# Patient Record
Sex: Female | Born: 1935
Health system: Southern US, Community
[De-identification: ages and names within clinical notes are randomized; demographics above are authoritative.]

## PROBLEM LIST (undated history)

## (undated) DIAGNOSIS — R739 Hyperglycemia, unspecified: Secondary | ICD-10-CM

## (undated) DIAGNOSIS — M8580 Other specified disorders of bone density and structure, unspecified site: Secondary | ICD-10-CM

## (undated) DIAGNOSIS — B019 Varicella without complication: Secondary | ICD-10-CM

## (undated) DIAGNOSIS — I10 Essential (primary) hypertension: Secondary | ICD-10-CM

## (undated) DIAGNOSIS — K635 Polyp of colon: Secondary | ICD-10-CM

## (undated) DIAGNOSIS — C50919 Malignant neoplasm of unspecified site of unspecified female breast: Secondary | ICD-10-CM

## (undated) DIAGNOSIS — M1990 Unspecified osteoarthritis, unspecified site: Secondary | ICD-10-CM

## (undated) DIAGNOSIS — E119 Type 2 diabetes mellitus without complications: Secondary | ICD-10-CM

## (undated) DIAGNOSIS — Z9109 Other allergy status, other than to drugs and biological substances: Secondary | ICD-10-CM

## (undated) DIAGNOSIS — Z923 Personal history of irradiation: Secondary | ICD-10-CM

## (undated) DIAGNOSIS — G629 Polyneuropathy, unspecified: Secondary | ICD-10-CM

## (undated) DIAGNOSIS — H409 Unspecified glaucoma: Secondary | ICD-10-CM

## (undated) DIAGNOSIS — E7800 Pure hypercholesterolemia, unspecified: Secondary | ICD-10-CM

## (undated) DIAGNOSIS — K5790 Diverticulosis of intestine, part unspecified, without perforation or abscess without bleeding: Secondary | ICD-10-CM

## (undated) DIAGNOSIS — E039 Hypothyroidism, unspecified: Secondary | ICD-10-CM

## (undated) DIAGNOSIS — N35919 Unspecified urethral stricture, male, unspecified site: Secondary | ICD-10-CM

## (undated) DIAGNOSIS — K219 Gastro-esophageal reflux disease without esophagitis: Secondary | ICD-10-CM

## (undated) DIAGNOSIS — C801 Malignant (primary) neoplasm, unspecified: Secondary | ICD-10-CM

## (undated) HISTORY — DX: Varicella without complication: B01.9

## (undated) HISTORY — DX: Gastro-esophageal reflux disease without esophagitis: K21.9

## (undated) HISTORY — DX: Polyp of colon: K63.5

## (undated) HISTORY — DX: Essential (primary) hypertension: I10

## (undated) HISTORY — PX: TUBAL LIGATION: SHX77

## (undated) HISTORY — DX: Other allergy status, other than to drugs and biological substances: Z91.09

## (undated) HISTORY — PX: EYE SURGERY: SHX253

## (undated) HISTORY — DX: Hypothyroidism, unspecified: E03.9

## (undated) HISTORY — DX: Polyneuropathy, unspecified: G62.9

## (undated) HISTORY — DX: Unspecified urethral stricture, male, unspecified site: N35.919

## (undated) HISTORY — DX: Hyperglycemia, unspecified: R73.9

## (undated) HISTORY — DX: Pure hypercholesterolemia, unspecified: E78.00

## (undated) HISTORY — DX: Unspecified glaucoma: H40.9

## (undated) HISTORY — DX: Unspecified osteoarthritis, unspecified site: M19.90

## (undated) HISTORY — DX: Malignant neoplasm of unspecified site of unspecified female breast: C50.919

## (undated) HISTORY — DX: Type 2 diabetes mellitus without complications: E11.9

## (undated) HISTORY — PX: URETHRAL DILATION: SUR417

## (undated) HISTORY — DX: Other specified disorders of bone density and structure, unspecified site: M85.80

## (undated) HISTORY — DX: Diverticulosis of intestine, part unspecified, without perforation or abscess without bleeding: K57.90

---

## 1984-04-21 DIAGNOSIS — Z923 Personal history of irradiation: Secondary | ICD-10-CM

## 1984-04-21 DIAGNOSIS — C50919 Malignant neoplasm of unspecified site of unspecified female breast: Secondary | ICD-10-CM

## 1984-04-21 HISTORY — DX: Malignant neoplasm of unspecified site of unspecified female breast: C50.919

## 1984-04-21 HISTORY — DX: Personal history of irradiation: Z92.3

## 1984-04-21 HISTORY — PX: BREAST LUMPECTOMY: SHX2

## 1984-09-19 HISTORY — PX: BREAST BIOPSY: SHX20

## 1991-04-22 HISTORY — PX: MASTECTOMY: SHX3

## 1999-02-13 ENCOUNTER — Ambulatory Visit (HOSPITAL_COMMUNITY): Admission: RE | Admit: 1999-02-13 | Discharge: 1999-02-13 | Payer: Self-pay | Admitting: Obstetrics & Gynecology

## 2006-04-09 ENCOUNTER — Ambulatory Visit: Payer: Self-pay | Admitting: Internal Medicine

## 2007-04-21 ENCOUNTER — Ambulatory Visit: Payer: Self-pay | Admitting: Internal Medicine

## 2008-05-02 ENCOUNTER — Ambulatory Visit: Payer: Self-pay | Admitting: General Practice

## 2008-05-26 ENCOUNTER — Ambulatory Visit: Payer: Self-pay | Admitting: Family Medicine

## 2008-06-19 HISTORY — PX: KNEE SURGERY: SHX244

## 2008-06-26 ENCOUNTER — Ambulatory Visit: Payer: Self-pay | Admitting: General Practice

## 2008-07-10 ENCOUNTER — Ambulatory Visit: Payer: Self-pay | Admitting: General Practice

## 2009-01-10 ENCOUNTER — Ambulatory Visit: Payer: Self-pay | Admitting: Internal Medicine

## 2009-01-19 ENCOUNTER — Ambulatory Visit: Payer: Self-pay | Admitting: Internal Medicine

## 2009-02-19 ENCOUNTER — Ambulatory Visit: Payer: Self-pay | Admitting: Internal Medicine

## 2009-03-21 ENCOUNTER — Ambulatory Visit: Payer: Self-pay | Admitting: Internal Medicine

## 2009-06-28 ENCOUNTER — Ambulatory Visit: Payer: Self-pay | Admitting: Unknown Physician Specialty

## 2009-08-06 ENCOUNTER — Ambulatory Visit: Payer: Self-pay | Admitting: Gastroenterology

## 2009-08-20 ENCOUNTER — Ambulatory Visit: Payer: Self-pay | Admitting: Gastroenterology

## 2010-04-01 ENCOUNTER — Ambulatory Visit: Payer: Self-pay

## 2010-04-21 HISTORY — PX: KNEE SURGERY: SHX244

## 2010-05-07 ENCOUNTER — Ambulatory Visit: Payer: Self-pay | Admitting: Anesthesiology

## 2010-05-15 ENCOUNTER — Ambulatory Visit: Payer: Self-pay | Admitting: General Practice

## 2010-06-25 ENCOUNTER — Ambulatory Visit: Payer: Self-pay | Admitting: Internal Medicine

## 2010-06-27 LAB — HM DEXA SCAN

## 2010-10-08 ENCOUNTER — Ambulatory Visit: Payer: Self-pay | Admitting: Obstetrics and Gynecology

## 2010-10-24 ENCOUNTER — Ambulatory Visit: Payer: Self-pay | Admitting: Internal Medicine

## 2011-02-19 ENCOUNTER — Ambulatory Visit: Payer: Self-pay | Admitting: Internal Medicine

## 2011-05-06 DIAGNOSIS — D510 Vitamin B12 deficiency anemia due to intrinsic factor deficiency: Secondary | ICD-10-CM | POA: Diagnosis not present

## 2011-05-19 DIAGNOSIS — H4010X0 Unspecified open-angle glaucoma, stage unspecified: Secondary | ICD-10-CM | POA: Diagnosis not present

## 2011-05-29 DIAGNOSIS — R21 Rash and other nonspecific skin eruption: Secondary | ICD-10-CM | POA: Diagnosis not present

## 2011-06-04 DIAGNOSIS — M810 Age-related osteoporosis without current pathological fracture: Secondary | ICD-10-CM | POA: Diagnosis not present

## 2011-06-06 DIAGNOSIS — D510 Vitamin B12 deficiency anemia due to intrinsic factor deficiency: Secondary | ICD-10-CM | POA: Diagnosis not present

## 2011-06-26 DIAGNOSIS — L820 Inflamed seborrheic keratosis: Secondary | ICD-10-CM | POA: Diagnosis not present

## 2011-06-30 DIAGNOSIS — H4010X0 Unspecified open-angle glaucoma, stage unspecified: Secondary | ICD-10-CM | POA: Diagnosis not present

## 2011-07-07 DIAGNOSIS — D510 Vitamin B12 deficiency anemia due to intrinsic factor deficiency: Secondary | ICD-10-CM | POA: Diagnosis not present

## 2011-07-09 DIAGNOSIS — M79609 Pain in unspecified limb: Secondary | ICD-10-CM | POA: Diagnosis not present

## 2011-07-09 DIAGNOSIS — B351 Tinea unguium: Secondary | ICD-10-CM | POA: Diagnosis not present

## 2011-08-05 DIAGNOSIS — E7800 Pure hypercholesterolemia, unspecified: Secondary | ICD-10-CM | POA: Diagnosis not present

## 2011-08-05 DIAGNOSIS — R5381 Other malaise: Secondary | ICD-10-CM | POA: Diagnosis not present

## 2011-08-05 DIAGNOSIS — Z79899 Other long term (current) drug therapy: Secondary | ICD-10-CM | POA: Diagnosis not present

## 2011-08-05 DIAGNOSIS — E119 Type 2 diabetes mellitus without complications: Secondary | ICD-10-CM | POA: Diagnosis not present

## 2011-08-05 DIAGNOSIS — R5383 Other fatigue: Secondary | ICD-10-CM | POA: Diagnosis not present

## 2011-08-07 DIAGNOSIS — D510 Vitamin B12 deficiency anemia due to intrinsic factor deficiency: Secondary | ICD-10-CM | POA: Diagnosis not present

## 2011-08-12 DIAGNOSIS — I10 Essential (primary) hypertension: Secondary | ICD-10-CM | POA: Diagnosis not present

## 2011-08-12 DIAGNOSIS — M79609 Pain in unspecified limb: Secondary | ICD-10-CM | POA: Diagnosis not present

## 2011-08-12 DIAGNOSIS — E119 Type 2 diabetes mellitus without complications: Secondary | ICD-10-CM | POA: Diagnosis not present

## 2011-08-12 DIAGNOSIS — Z124 Encounter for screening for malignant neoplasm of cervix: Secondary | ICD-10-CM | POA: Diagnosis not present

## 2011-08-12 DIAGNOSIS — E7800 Pure hypercholesterolemia, unspecified: Secondary | ICD-10-CM | POA: Diagnosis not present

## 2011-08-21 DIAGNOSIS — Z1211 Encounter for screening for malignant neoplasm of colon: Secondary | ICD-10-CM | POA: Diagnosis not present

## 2011-08-21 LAB — IFOBT (OCCULT BLOOD): IFOBT: NEGATIVE

## 2011-08-29 DIAGNOSIS — H4010X0 Unspecified open-angle glaucoma, stage unspecified: Secondary | ICD-10-CM | POA: Diagnosis not present

## 2011-09-09 DIAGNOSIS — D510 Vitamin B12 deficiency anemia due to intrinsic factor deficiency: Secondary | ICD-10-CM | POA: Diagnosis not present

## 2011-09-30 DIAGNOSIS — L570 Actinic keratosis: Secondary | ICD-10-CM | POA: Diagnosis not present

## 2011-09-30 DIAGNOSIS — Z85828 Personal history of other malignant neoplasm of skin: Secondary | ICD-10-CM | POA: Diagnosis not present

## 2011-09-30 DIAGNOSIS — L821 Other seborrheic keratosis: Secondary | ICD-10-CM | POA: Diagnosis not present

## 2011-09-30 DIAGNOSIS — L820 Inflamed seborrheic keratosis: Secondary | ICD-10-CM | POA: Diagnosis not present

## 2011-10-01 DIAGNOSIS — R21 Rash and other nonspecific skin eruption: Secondary | ICD-10-CM | POA: Diagnosis not present

## 2011-10-01 DIAGNOSIS — I10 Essential (primary) hypertension: Secondary | ICD-10-CM | POA: Diagnosis not present

## 2011-10-07 ENCOUNTER — Ambulatory Visit: Payer: Self-pay | Admitting: Obstetrics and Gynecology

## 2011-10-07 DIAGNOSIS — N83209 Unspecified ovarian cyst, unspecified side: Secondary | ICD-10-CM | POA: Diagnosis not present

## 2011-10-13 DIAGNOSIS — Z853 Personal history of malignant neoplasm of breast: Secondary | ICD-10-CM | POA: Diagnosis not present

## 2011-10-13 DIAGNOSIS — Z8742 Personal history of other diseases of the female genital tract: Secondary | ICD-10-CM | POA: Diagnosis not present

## 2011-10-14 DIAGNOSIS — D510 Vitamin B12 deficiency anemia due to intrinsic factor deficiency: Secondary | ICD-10-CM | POA: Diagnosis not present

## 2011-10-29 ENCOUNTER — Ambulatory Visit: Payer: Self-pay | Admitting: Internal Medicine

## 2011-10-29 DIAGNOSIS — R928 Other abnormal and inconclusive findings on diagnostic imaging of breast: Secondary | ICD-10-CM | POA: Diagnosis not present

## 2011-10-29 DIAGNOSIS — Z853 Personal history of malignant neoplasm of breast: Secondary | ICD-10-CM | POA: Diagnosis not present

## 2011-10-29 DIAGNOSIS — N63 Unspecified lump in unspecified breast: Secondary | ICD-10-CM | POA: Diagnosis not present

## 2011-11-14 DIAGNOSIS — D510 Vitamin B12 deficiency anemia due to intrinsic factor deficiency: Secondary | ICD-10-CM | POA: Diagnosis not present

## 2011-12-04 DIAGNOSIS — Z79899 Other long term (current) drug therapy: Secondary | ICD-10-CM | POA: Diagnosis not present

## 2011-12-04 DIAGNOSIS — E7800 Pure hypercholesterolemia, unspecified: Secondary | ICD-10-CM | POA: Diagnosis not present

## 2011-12-04 DIAGNOSIS — I10 Essential (primary) hypertension: Secondary | ICD-10-CM | POA: Diagnosis not present

## 2011-12-04 DIAGNOSIS — E119 Type 2 diabetes mellitus without complications: Secondary | ICD-10-CM | POA: Diagnosis not present

## 2011-12-08 DIAGNOSIS — H4010X0 Unspecified open-angle glaucoma, stage unspecified: Secondary | ICD-10-CM | POA: Diagnosis not present

## 2011-12-24 DIAGNOSIS — D510 Vitamin B12 deficiency anemia due to intrinsic factor deficiency: Secondary | ICD-10-CM | POA: Diagnosis not present

## 2012-01-05 DIAGNOSIS — Z23 Encounter for immunization: Secondary | ICD-10-CM | POA: Diagnosis not present

## 2012-01-27 DIAGNOSIS — D510 Vitamin B12 deficiency anemia due to intrinsic factor deficiency: Secondary | ICD-10-CM | POA: Diagnosis not present

## 2012-02-17 ENCOUNTER — Telehealth: Payer: Self-pay | Admitting: Internal Medicine

## 2012-02-17 NOTE — Telephone Encounter (Signed)
Patient wanting to talk with the physician about her medications. She is taking Gabapentin 100 mg twice a day for stinging in her feet and it is not working.

## 2012-02-17 NOTE — Telephone Encounter (Signed)
I can see her at 8:30 on Monday to discuss the burning.  Will need to notify pt and schedule an appt

## 2012-02-17 NOTE — Telephone Encounter (Signed)
Patient wanting to talk with the physician about her medications. She is taking Gabapentin 100 mg twice a day for stinging in her feet and it is not working. °  Patient wanting to talk with the physician about her medications. She is taking Gabapentin 100 mg twice a day for stinging in her feet and it is not working."

## 2012-03-01 DIAGNOSIS — D510 Vitamin B12 deficiency anemia due to intrinsic factor deficiency: Secondary | ICD-10-CM | POA: Diagnosis not present

## 2012-03-19 ENCOUNTER — Ambulatory Visit (INDEPENDENT_AMBULATORY_CARE_PROVIDER_SITE_OTHER): Payer: Medicare Other | Admitting: Internal Medicine

## 2012-03-19 ENCOUNTER — Encounter: Payer: Self-pay | Admitting: Internal Medicine

## 2012-03-19 VITALS — BP 120/70 | HR 75 | Temp 97.8°F | Ht 65.5 in | Wt 170.0 lb

## 2012-03-19 DIAGNOSIS — Z853 Personal history of malignant neoplasm of breast: Secondary | ICD-10-CM | POA: Insufficient documentation

## 2012-03-19 DIAGNOSIS — G609 Hereditary and idiopathic neuropathy, unspecified: Secondary | ICD-10-CM

## 2012-03-19 DIAGNOSIS — E039 Hypothyroidism, unspecified: Secondary | ICD-10-CM | POA: Insufficient documentation

## 2012-03-19 DIAGNOSIS — R739 Hyperglycemia, unspecified: Secondary | ICD-10-CM

## 2012-03-19 DIAGNOSIS — G629 Polyneuropathy, unspecified: Secondary | ICD-10-CM

## 2012-03-19 DIAGNOSIS — C50919 Malignant neoplasm of unspecified site of unspecified female breast: Secondary | ICD-10-CM

## 2012-03-19 DIAGNOSIS — R7309 Other abnormal glucose: Secondary | ICD-10-CM | POA: Diagnosis not present

## 2012-03-19 DIAGNOSIS — I10 Essential (primary) hypertension: Secondary | ICD-10-CM | POA: Insufficient documentation

## 2012-03-19 DIAGNOSIS — E1142 Type 2 diabetes mellitus with diabetic polyneuropathy: Secondary | ICD-10-CM | POA: Insufficient documentation

## 2012-03-19 DIAGNOSIS — E7800 Pure hypercholesterolemia, unspecified: Secondary | ICD-10-CM | POA: Diagnosis not present

## 2012-03-19 NOTE — Progress Notes (Signed)
Subjective:    Patient ID: Julie Hoover, female    DOB: 1929/06/02, 78 y.o.   MRN: [id number]  HPI 78 year old female with past history of hypertension, hypercholesterolemia, hyperglycemia and breast cancer who comes in today for a scheduled follow up.  She states she has been doing well.  She has been having more problems with her neuropathy.  Taking gabapentin 100mg  bid.  She was questioning if she could take an extra gabapentin.  Some unsteady gait - minimal.  Attributes to the neuropathy.  No falls.  She is careful, especially on uneven ground.  AM sugars averaging 88-100 and pm sugars - 120-140s.  Bowels stable.  No chest pain or tightness.  No increased sob.    Past Medical History  Diagnosis Date  . Hypertension   . Hypercholesterolemia   . Hyperglycemia   . Hypothyroidism   . Breast cancer 1986    s/p right lumpectomy, XRT and Tamoxifen  . Diverticulosis   . Environmental allergies   . Arthritis   . GERD (gastroesophageal reflux disease)   . Glaucoma   . Colon polyps   . Chicken pox     Outpatient Encounter Prescriptions as of 03/19/2012  Medication Sig Dispense Refill  . amLODipine (NORVASC) 5 MG tablet Take 5 mg by mouth daily.      Marland Kitchen aspirin 81 MG tablet Take 81 mg by mouth daily.      . bimatoprost (LUMIGAN) 0.01 % SOLN 1 drop at bedtime.      . Calcium Carbonate-Vitamin D (CALCIUM 600+D) 600-400 MG-UNIT per tablet Take 1 tablet by mouth 3 (three) times daily.       . cetirizine (ZYRTEC) 10 MG tablet Take 10 mg by mouth daily.      . Coenzyme Q10 (COQ10) 400 MG CAPS Take by mouth daily.      . Cyanocobalamin (VITAMIN B-12 IJ) Inject as directed once. monthly      . gabapentin (NEURONTIN) 100 MG capsule Take 100 mg by mouth 2 (two) times daily.      Marland Kitchen latanoprost (XALATAN) 0.005 % ophthalmic solution Place 1 drop into both eyes at bedtime.      Marland Kitchen levothyroxine (SYNTHROID, LEVOTHROID) 50 MCG tablet Take 50 mcg by mouth daily.      Marland Kitchen omeprazole (PRILOSEC) 20 MG capsule  Take 20 mg by mouth 2 (two) times daily.      . simvastatin (ZOCOR) 10 MG tablet Take 10 mg by mouth daily.      . temazepam (RESTORIL) 30 MG capsule Take 30 mg by mouth at bedtime as needed.      . timolol (BETIMOL) 0.25 % ophthalmic solution Place 1 drop into both eyes 2 (two) times daily.      . zoledronic acid (RECLAST) 5 MG/100ML SOLN Inject 5 mg into the vein once.      . [DISCONTINUED] co-enzyme Q-10 30 MG capsule Take 30 mg by mouth 2 (two) times daily.      Marland Kitchen glucosamine-chondroitin 500-400 MG tablet Take 1 tablet by mouth 2 (two) times daily.      Marland Kitchen ibandronate (BONIVA) 150 MG tablet Take 150 mg by mouth every 30 (thirty) days. Take in the morning with a full glass of water, on an empty stomach, and do not take anything else by mouth or lie down for the next 30 min.        Review of Systems Patient denies any headache, lightheadedness or dizziness.  No significant sinus or allergy problems. No  chest pain, tightness or palpitations.  No increased shortness of breath, cough or congestion.  No nausea or vomiting.  No increased acid reflux.  No abdominal pain or cramping.  No bowel change, such as diarrhea, constipation, BRBPR or melana.  No urine change.        Objective:   Physical Exam Filed Vitals:   03/19/12 1108  BP: 120/70  Pulse: 75  Temp: 97.8 F (3.24 C)   78 year old female in no acute distress.   HEENT:  Nares- clear.  Oropharynx - without lesions. NECK:  Supple.  Nontender.  No audible bruit.  HEART:  Appears to be regular. LUNGS:  No crackles or wheezing audible.  Respirations even and unlabored.  RADIAL PULSE:  Equal bilaterally.  ABDOMEN:  Soft, nontender.  Bowel sounds present and normal.  No audible abdominal bruit.    EXTREMITIES:  No increased edema present.  DP pulses palpable and equal bilaterally.  Feet without lesions.          Assessment & Plan:  DIFFICULTY SLEEPING.  Takes Temazepam prn.  Follow.    HEALTH MAINTENANCE.  She reports she just had a  physical in 6/13.  Obtain records.  States she is up to date with her mammograms.

## 2012-03-19 NOTE — Patient Instructions (Signed)
It was nice seeing you today.  I am glad you have been doing well.  I am going to have you continue the neurontin (gabapentin) 100mg  in the am and increase to 2 (100mg ) gabapentin in the evening.  Let me know if problems.

## 2012-03-20 ENCOUNTER — Encounter: Payer: Self-pay | Admitting: Internal Medicine

## 2012-03-20 DIAGNOSIS — G629 Polyneuropathy, unspecified: Secondary | ICD-10-CM | POA: Insufficient documentation

## 2012-03-20 NOTE — Assessment & Plan Note (Signed)
On Gabapentin.  Will increase to 100mg  q am and 100mg  (2 q pm).  Notify me if persistent problems.

## 2012-03-20 NOTE — Assessment & Plan Note (Signed)
S/p right lumpectomy 1986 with XRT and Tamoxifen 1986.  S/p mastectomy (right) for reoccurrence 1993.  States she is up to date with her mammograms.  Obtain results.

## 2012-03-20 NOTE — Assessment & Plan Note (Signed)
Blood pressure under good control.  Same medication.  Check metabolic panel with next labs.

## 2012-03-20 NOTE — Assessment & Plan Note (Signed)
On Simvastatin.  Low cholesterol diet and exercise.  Check lipid panel and liver function with next labs.

## 2012-03-20 NOTE — Assessment & Plan Note (Signed)
Sugars as outlined.  Low carb diet.  Exercise.  Check met b and a1c.

## 2012-03-20 NOTE — Assessment & Plan Note (Signed)
On synthroid.  Check tsh with next labs.   

## 2012-04-01 ENCOUNTER — Telehealth: Payer: Self-pay | Admitting: Internal Medicine

## 2012-04-01 ENCOUNTER — Ambulatory Visit: Payer: Medicare Other

## 2012-04-01 NOTE — Telephone Encounter (Signed)
Please call pt when we get b12 in to make appointment

## 2012-04-08 DIAGNOSIS — H4010X0 Unspecified open-angle glaucoma, stage unspecified: Secondary | ICD-10-CM | POA: Diagnosis not present

## 2012-04-08 NOTE — Telephone Encounter (Signed)
Patient is coming in on 04/09/12 for b-12 injection

## 2012-04-09 ENCOUNTER — Ambulatory Visit (INDEPENDENT_AMBULATORY_CARE_PROVIDER_SITE_OTHER): Payer: Medicare Other | Admitting: Internal Medicine

## 2012-04-09 DIAGNOSIS — E538 Deficiency of other specified B group vitamins: Secondary | ICD-10-CM

## 2012-04-09 MED ORDER — CYANOCOBALAMIN 1000 MCG/ML IJ SOLN
1000.0000 ug | Freq: Once | INTRAMUSCULAR | Status: AC
  Administered 2012-04-09: 1000 ug via INTRAMUSCULAR

## 2012-04-20 ENCOUNTER — Ambulatory Visit: Payer: Self-pay | Admitting: Internal Medicine

## 2012-04-26 ENCOUNTER — Other Ambulatory Visit: Payer: Self-pay | Admitting: Internal Medicine

## 2012-04-26 MED ORDER — OMEPRAZOLE 20 MG PO CPDR: 20.0000 mg | DELAYED_RELEASE_CAPSULE | Freq: Two times a day (BID) | ORAL | Status: DC

## 2012-04-26 NOTE — Telephone Encounter (Signed)
Omeprazole 20 mg  Take 1 capsule twice daily  # 90

## 2012-04-26 NOTE — Telephone Encounter (Signed)
Sent in to pharmacy.  

## 2012-05-05 ENCOUNTER — Other Ambulatory Visit: Payer: Self-pay | Admitting: *Deleted

## 2012-05-05 MED ORDER — OMEPRAZOLE 20 MG PO CPDR: 20.0000 mg | DELAYED_RELEASE_CAPSULE | Freq: Two times a day (BID) | ORAL | Status: DC

## 2012-05-05 NOTE — Telephone Encounter (Signed)
Corrected pharmacy error. RX was sent to [facility].

## 2012-05-07 ENCOUNTER — Other Ambulatory Visit: Payer: Self-pay | Admitting: Internal Medicine

## 2012-05-07 MED ORDER — GABAPENTIN 100 MG PO CAPS: 100.0000 mg | ORAL_CAPSULE | Freq: Two times a day (BID) | ORAL | Status: DC

## 2012-05-07 NOTE — Telephone Encounter (Signed)
Refill on Gabapentin cap 100 mg to [facility]

## 2012-05-07 NOTE — Telephone Encounter (Signed)
Sent in to pharmacy.  

## 2012-05-10 ENCOUNTER — Ambulatory Visit (INDEPENDENT_AMBULATORY_CARE_PROVIDER_SITE_OTHER): Payer: Medicare Other | Admitting: *Deleted

## 2012-05-10 DIAGNOSIS — E538 Deficiency of other specified B group vitamins: Secondary | ICD-10-CM | POA: Diagnosis not present

## 2012-05-10 MED ORDER — CYANOCOBALAMIN 1000 MCG/ML IJ SOLN
1000.0000 ug | Freq: Once | INTRAMUSCULAR | Status: AC
  Administered 2012-05-10: 1000 ug via INTRAMUSCULAR

## 2012-05-11 ENCOUNTER — Encounter: Payer: Self-pay | Admitting: Internal Medicine

## 2012-05-12 ENCOUNTER — Telehealth: Payer: Self-pay | Admitting: Internal Medicine

## 2012-05-12 MED ORDER — GABAPENTIN 100 MG PO CAPS: 100.0000 mg | ORAL_CAPSULE | Freq: Three times a day (TID) | ORAL | Status: DC

## 2012-05-12 NOTE — Telephone Encounter (Signed)
Sent new prescription into pharmacy.

## 2012-05-12 NOTE — Telephone Encounter (Signed)
Need to change rx for gabapentin to 100mg  - take three q hs.  (not 2).  Pt just sent me a my chart message and she has increased her gabapentin from 2 to 3 capsules q hs.  She requested refill on 05/07/12.  Need to change rx.  Thanks.

## 2012-05-19 ENCOUNTER — Encounter: Payer: Self-pay | Admitting: Internal Medicine

## 2012-05-23 ENCOUNTER — Telehealth: Payer: Self-pay | Admitting: Internal Medicine

## 2012-05-23 DIAGNOSIS — C50919 Malignant neoplasm of unspecified site of unspecified female breast: Secondary | ICD-10-CM

## 2012-05-23 DIAGNOSIS — M810 Age-related osteoporosis without current pathological fracture: Secondary | ICD-10-CM

## 2012-05-23 NOTE — Telephone Encounter (Signed)
Orders placed for labs - for reclast

## 2012-06-07 ENCOUNTER — Encounter: Payer: Self-pay | Admitting: Internal Medicine

## 2012-06-07 MED ORDER — AMLODIPINE BESYLATE 5 MG PO TABS: 5.0000 mg | ORAL_TABLET | Freq: Every day | ORAL | Status: DC

## 2012-06-07 NOTE — Telephone Encounter (Signed)
Sent in to pharmacy.  

## 2012-06-08 ENCOUNTER — Other Ambulatory Visit (INDEPENDENT_AMBULATORY_CARE_PROVIDER_SITE_OTHER): Payer: Medicare Other

## 2012-06-08 DIAGNOSIS — R739 Hyperglycemia, unspecified: Secondary | ICD-10-CM

## 2012-06-08 DIAGNOSIS — M810 Age-related osteoporosis without current pathological fracture: Secondary | ICD-10-CM

## 2012-06-08 DIAGNOSIS — I10 Essential (primary) hypertension: Secondary | ICD-10-CM | POA: Diagnosis not present

## 2012-06-08 DIAGNOSIS — C50919 Malignant neoplasm of unspecified site of unspecified female breast: Secondary | ICD-10-CM | POA: Diagnosis not present

## 2012-06-08 DIAGNOSIS — R7309 Other abnormal glucose: Secondary | ICD-10-CM

## 2012-06-08 DIAGNOSIS — E7800 Pure hypercholesterolemia, unspecified: Secondary | ICD-10-CM | POA: Diagnosis not present

## 2012-06-08 LAB — HEMOGLOBIN A1C: Hgb A1c MFr Bld: 6.8 % — ABNORMAL HIGH (ref 4.6–6.5)

## 2012-06-08 LAB — HEPATIC FUNCTION PANEL
ALT: 16 U/L (ref 0–35)
AST: 18 U/L (ref 0–37)
Alkaline Phosphatase: 48 U/L (ref 39–117)
Bilirubin, Direct: 0.1 mg/dL (ref 0.0–0.3)
Total Protein: 7.7 g/dL (ref 6.0–8.3)

## 2012-06-08 LAB — BASIC METABOLIC PANEL
Chloride: 108 mEq/L (ref 96–112)
GFR: 62.32 mL/min (ref >60.00)
Potassium: 4 mEq/L (ref 3.5–5.1)
Sodium: 142 mEq/L (ref 135–145)

## 2012-06-08 LAB — URINALYSIS, ROUTINE W REFLEX MICROSCOPIC
Bilirubin Urine: NEGATIVE
Hgb urine dipstick: NEGATIVE
Total Protein, Urine: NEGATIVE
Urine Glucose: NEGATIVE

## 2012-06-08 LAB — LIPID PANEL: Cholesterol: 168 mg/dL (ref 0–200)

## 2012-06-09 ENCOUNTER — Telehealth: Payer: Self-pay | Admitting: Internal Medicine

## 2012-06-09 NOTE — Telephone Encounter (Signed)
Notified pt of labs via mychart

## 2012-06-10 ENCOUNTER — Encounter: Payer: Self-pay | Admitting: Internal Medicine

## 2012-06-15 ENCOUNTER — Ambulatory Visit (INDEPENDENT_AMBULATORY_CARE_PROVIDER_SITE_OTHER): Payer: Medicare Other | Admitting: Internal Medicine

## 2012-06-15 ENCOUNTER — Encounter: Payer: Self-pay | Admitting: Internal Medicine

## 2012-06-15 VITALS — BP 112/60 | HR 65 | Temp 97.8°F | Ht 65.5 in | Wt 169.5 lb

## 2012-06-15 DIAGNOSIS — G609 Hereditary and idiopathic neuropathy, unspecified: Secondary | ICD-10-CM

## 2012-06-15 DIAGNOSIS — M8580 Other specified disorders of bone density and structure, unspecified site: Secondary | ICD-10-CM

## 2012-06-15 DIAGNOSIS — E039 Hypothyroidism, unspecified: Secondary | ICD-10-CM

## 2012-06-15 DIAGNOSIS — G629 Polyneuropathy, unspecified: Secondary | ICD-10-CM

## 2012-06-15 DIAGNOSIS — E7800 Pure hypercholesterolemia, unspecified: Secondary | ICD-10-CM

## 2012-06-15 DIAGNOSIS — R7309 Other abnormal glucose: Secondary | ICD-10-CM | POA: Diagnosis not present

## 2012-06-15 DIAGNOSIS — R739 Hyperglycemia, unspecified: Secondary | ICD-10-CM

## 2012-06-15 DIAGNOSIS — M949 Disorder of cartilage, unspecified: Secondary | ICD-10-CM

## 2012-06-15 DIAGNOSIS — I10 Essential (primary) hypertension: Secondary | ICD-10-CM

## 2012-06-27 ENCOUNTER — Encounter: Payer: Self-pay | Admitting: Internal Medicine

## 2012-06-27 ENCOUNTER — Other Ambulatory Visit: Payer: Self-pay | Admitting: Internal Medicine

## 2012-06-27 DIAGNOSIS — R739 Hyperglycemia, unspecified: Secondary | ICD-10-CM

## 2012-06-27 DIAGNOSIS — I10 Essential (primary) hypertension: Secondary | ICD-10-CM

## 2012-06-27 DIAGNOSIS — E7800 Pure hypercholesterolemia, unspecified: Secondary | ICD-10-CM

## 2012-06-27 DIAGNOSIS — E039 Hypothyroidism, unspecified: Secondary | ICD-10-CM

## 2012-06-27 DIAGNOSIS — M8580 Other specified disorders of bone density and structure, unspecified site: Secondary | ICD-10-CM | POA: Insufficient documentation

## 2012-06-27 NOTE — Progress Notes (Signed)
Subjective:    Patient ID: Julie Hoover, female    DOB: 25-Oct-1929, 79 y.o.   MRN: [id number]  HPI 79 year old female with past history of hypertension, hypercholesterolemia, hyperglycemia and breast cancer who comes in today for a scheduled follow up.  AM sugars averaging 90-110 and pm sugars - 120-140s.  Bowels stable.  No chest pain or tightness.  No increased sob.  Breathing stable.  Saw Dr Greggory Keen.  Had a follow up pelvic ultrasound.  States everything checked out fine.  Repeat one year.  No abdominal pain or cramping.  Gabapentin controlling neuropathic discomfort.  Gait stable.    Past Medical History  Diagnosis Date  . Hypertension   . Hypercholesterolemia   . Hyperglycemia   . Hypothyroidism   . Breast cancer 1986    s/p right lumpectomy, XRT and Tamoxifen  . Diverticulosis   . Environmental allergies   . Arthritis   . GERD (gastroesophageal reflux disease)   . Glaucoma   . Colon polyps   . Chicken pox     Outpatient Encounter Prescriptions as of 06/15/2012  Medication Sig Dispense Refill  . amLODipine (NORVASC) 5 MG tablet Take 1 tablet (5 mg total) by mouth daily.  90 tablet  1  . aspirin 81 MG tablet Take 81 mg by mouth daily.      . bimatoprost (LUMIGAN) 0.01 % SOLN 1 drop at bedtime.      . Calcium Carbonate-Vitamin D (CALCIUM 600+D) 600-400 MG-UNIT per tablet Take 1 tablet by mouth 3 (three) times daily.       . cetirizine (ZYRTEC) 10 MG tablet Take 10 mg by mouth daily.      . Coenzyme Q10 (COQ10) 400 MG CAPS Take by mouth 2 (two) times daily.       . Cyanocobalamin (VITAMIN B-12 IJ) Inject as directed once. monthly      . gabapentin (NEURONTIN) 100 MG capsule Take 1 capsule (100 mg total) by mouth 3 (three) times daily.  270 capsule  1  . omeprazole (PRILOSEC) 20 MG capsule Take 1 capsule (20 mg total) by mouth 2 (two) times daily.  180 capsule  1  . simvastatin (ZOCOR) 10 MG tablet Take 10 mg by mouth daily.      . temazepam (RESTORIL) 30 MG capsule Take 30  mg by mouth at bedtime as needed.      . timolol (BETIMOL) 0.25 % ophthalmic solution Place 1 drop into both eyes 2 (two) times daily.      . zoledronic acid (RECLAST) 5 MG/100ML SOLN Inject 5 mg into the vein once.      Marland Kitchen glucosamine-chondroitin 500-400 MG tablet Take 1 tablet by mouth 2 (two) times daily.      Marland Kitchen ibandronate (BONIVA) 150 MG tablet Take 150 mg by mouth every 30 (thirty) days. Take in the morning with a full glass of water, on an empty stomach, and do not take anything else by mouth or lie down for the next 30 min.      . latanoprost (XALATAN) 0.005 % ophthalmic solution Place 1 drop into both eyes at bedtime.      Marland Kitchen levothyroxine (SYNTHROID, LEVOTHROID) 50 MCG tablet Take 50 mcg by mouth daily.       No facility-administered encounter medications on file as of 06/15/2012.    Review of Systems Patient denies any headache, lightheadedness or dizziness.  No significant sinus or allergy problems. No chest pain, tightness or palpitations.  No increased shortness of breath, cough  or congestion.  No nausea or vomiting.  No increased acid reflux.  No abdominal pain or cramping.  No bowel change, such as diarrhea, constipation, BRBPR or melana.  No urine change.        Objective:   Physical Exam  Filed Vitals:   06/15/12 0916  BP: 112/60  Pulse: 65  Temp: 97.8 F (46.22 C)   79 year old female in no acute distress.   HEENT:  Nares- clear.  Oropharynx - without lesions. NECK:  Supple.  Nontender.  No audible bruit.  HEART:  Appears to be regular. LUNGS:  No crackles or wheezing audible.  Respirations even and unlabored.  RADIAL PULSE:  Equal bilaterally.  ABDOMEN:  Soft, nontender.  Bowel sounds present and normal.  No audible abdominal bruit.    EXTREMITIES:  No increased edema present.  DP pulses palpable and equal bilaterally.  Feet without lesions.          Assessment & Plan:  DIFFICULTY SLEEPING.  Takes Temazepam prn.  Follow.    HEALTH MAINTENANCE.  Physical 08/12/11.   Colonoscopy 08/20/09 - diverticulosis.  Mammogram 09/29/11 - Birads II.

## 2012-06-27 NOTE — Assessment & Plan Note (Signed)
On Gabapentin.  Symptoms controlled.  Follow.

## 2012-06-27 NOTE — Assessment & Plan Note (Signed)
On Simvastatin.  Low cholesterol diet and exercise.  Lipid panel 06/08/12 revealed total cholesterol 168, triglycerides 64, HDL 57 and LDL 97.

## 2012-06-27 NOTE — Assessment & Plan Note (Signed)
S/p right lumpectomy 1986 with XRT and Tamoxifen 1986.  S/p mastectomy (right) for reoccurrence 1993.  Mammogram 09/29/11 - BiRADS II.

## 2012-06-27 NOTE — Assessment & Plan Note (Signed)
Blood pressure under good control.  Same medication.  Follow metabolic panel.   

## 2012-06-27 NOTE — Progress Notes (Signed)
Orders placed for follow up labs 

## 2012-06-27 NOTE — Assessment & Plan Note (Signed)
Sugars as outlined.  Low carb diet.  Exercise.  A1c 06/08/12 - 6.8.

## 2012-06-27 NOTE — Assessment & Plan Note (Signed)
On synthroid.  Follow tsh.   

## 2012-06-27 NOTE — Assessment & Plan Note (Signed)
Last bone density 06/27/10 revealed osteopenia.  Decreased from 2009.  Vitamin D 06/08/12 wnl.  Continue calcium and vitamin D and weight bearing exercise.

## 2012-06-28 ENCOUNTER — Encounter: Payer: Self-pay | Admitting: Internal Medicine

## 2012-07-16 ENCOUNTER — Encounter: Payer: Self-pay | Admitting: Internal Medicine

## 2012-07-18 ENCOUNTER — Encounter: Payer: Self-pay | Admitting: Internal Medicine

## 2012-07-20 ENCOUNTER — Telehealth: Payer: Self-pay | Admitting: *Deleted

## 2012-07-20 ENCOUNTER — Ambulatory Visit (INDEPENDENT_AMBULATORY_CARE_PROVIDER_SITE_OTHER): Payer: Medicare Other | Admitting: *Deleted

## 2012-07-20 DIAGNOSIS — E538 Deficiency of other specified B group vitamins: Secondary | ICD-10-CM

## 2012-07-20 MED ORDER — CYANOCOBALAMIN 1000 MCG/ML IJ SOLN
1000.0000 ug | Freq: Once | INTRAMUSCULAR | Status: AC
  Administered 2012-07-20: 1000 ug via INTRAMUSCULAR

## 2012-07-20 NOTE — Telephone Encounter (Signed)
Patient called wanting B-12 inj appointment. Appt scheduled.

## 2012-08-04 NOTE — Telephone Encounter (Signed)
Please close encounter

## 2012-08-12 ENCOUNTER — Other Ambulatory Visit: Payer: Self-pay | Admitting: *Deleted

## 2012-08-12 DIAGNOSIS — H4010X0 Unspecified open-angle glaucoma, stage unspecified: Secondary | ICD-10-CM | POA: Diagnosis not present

## 2012-08-12 MED ORDER — LEVOTHYROXINE SODIUM 50 MCG PO TABS: 50.0000 ug | ORAL_TABLET | Freq: Every day | ORAL | Status: DC

## 2012-08-16 DIAGNOSIS — L570 Actinic keratosis: Secondary | ICD-10-CM | POA: Diagnosis not present

## 2012-08-16 DIAGNOSIS — L723 Sebaceous cyst: Secondary | ICD-10-CM | POA: Diagnosis not present

## 2012-08-18 ENCOUNTER — Other Ambulatory Visit: Payer: Self-pay | Admitting: *Deleted

## 2012-08-18 ENCOUNTER — Telehealth: Payer: Self-pay | Admitting: Internal Medicine

## 2012-08-18 NOTE — Telephone Encounter (Signed)
Pt notified that not due reclast until 05/2013

## 2012-08-24 ENCOUNTER — Ambulatory Visit (INDEPENDENT_AMBULATORY_CARE_PROVIDER_SITE_OTHER): Payer: Medicare Other | Admitting: *Deleted

## 2012-08-24 DIAGNOSIS — E538 Deficiency of other specified B group vitamins: Secondary | ICD-10-CM

## 2012-08-24 MED ORDER — CYANOCOBALAMIN 1000 MCG/ML IJ SOLN
1000.0000 ug | Freq: Once | INTRAMUSCULAR | Status: AC
  Administered 2012-08-24: 1000 ug via INTRAMUSCULAR

## 2012-08-27 ENCOUNTER — Other Ambulatory Visit: Payer: Self-pay | Admitting: *Deleted

## 2012-08-27 MED ORDER — LEVOTHYROXINE SODIUM 50 MCG PO TABS: 50.0000 ug | ORAL_TABLET | Freq: Every day | ORAL | Status: DC

## 2012-09-09 ENCOUNTER — Other Ambulatory Visit (INDEPENDENT_AMBULATORY_CARE_PROVIDER_SITE_OTHER): Payer: Medicare Other

## 2012-09-09 DIAGNOSIS — I10 Essential (primary) hypertension: Secondary | ICD-10-CM

## 2012-09-09 DIAGNOSIS — E7800 Pure hypercholesterolemia, unspecified: Secondary | ICD-10-CM

## 2012-09-09 DIAGNOSIS — R7309 Other abnormal glucose: Secondary | ICD-10-CM

## 2012-09-09 DIAGNOSIS — E039 Hypothyroidism, unspecified: Secondary | ICD-10-CM | POA: Diagnosis not present

## 2012-09-09 DIAGNOSIS — R739 Hyperglycemia, unspecified: Secondary | ICD-10-CM

## 2012-09-09 LAB — MICROALBUMIN / CREATININE URINE RATIO
Creatinine,U: 247.1 mg/dL
Microalb Creat Ratio: 0.4 mg/g (ref 0.0–30.0)
Microalb, Ur: 1 mg/dL (ref 0.0–1.9)

## 2012-09-09 LAB — HEPATIC FUNCTION PANEL
Albumin: 4 g/dL (ref 3.5–5.2)
Bilirubin, Direct: 0 mg/dL (ref 0.0–0.3)
Total Protein: 7.4 g/dL (ref 6.0–8.3)

## 2012-09-09 LAB — LIPID PANEL
Cholesterol: 171 mg/dL (ref 0–200)
HDL: 53.4 mg/dL (ref >39.00)
LDL Cholesterol: 103 mg/dL — ABNORMAL HIGH (ref 0–99)
VLDL: 15 mg/dL (ref 0.0–40.0)

## 2012-09-09 LAB — BASIC METABOLIC PANEL
CO2: 27 mEq/L (ref 19–32)
Chloride: 107 mEq/L (ref 96–112)
Creatinine, Ser: 1 mg/dL (ref 0.4–1.2)
Potassium: 4.1 mEq/L (ref 3.5–5.1)

## 2012-09-10 ENCOUNTER — Encounter: Payer: Self-pay | Admitting: Internal Medicine

## 2012-09-10 ENCOUNTER — Other Ambulatory Visit: Payer: Self-pay | Admitting: *Deleted

## 2012-09-10 MED ORDER — GABAPENTIN 100 MG PO CAPS: 100.0000 mg | ORAL_CAPSULE | Freq: Three times a day (TID) | ORAL | Status: DC

## 2012-09-10 MED ORDER — OMEPRAZOLE 20 MG PO CPDR: 20.0000 mg | DELAYED_RELEASE_CAPSULE | Freq: Two times a day (BID) | ORAL | Status: DC

## 2012-09-14 ENCOUNTER — Ambulatory Visit (INDEPENDENT_AMBULATORY_CARE_PROVIDER_SITE_OTHER): Payer: Medicare Other | Admitting: Internal Medicine

## 2012-09-14 ENCOUNTER — Encounter: Payer: Self-pay | Admitting: Internal Medicine

## 2012-09-14 VITALS — BP 120/64 | HR 59 | Temp 98.0°F | Ht 64.3 in | Wt 171.0 lb

## 2012-09-14 DIAGNOSIS — E7800 Pure hypercholesterolemia, unspecified: Secondary | ICD-10-CM | POA: Diagnosis not present

## 2012-09-14 DIAGNOSIS — R7309 Other abnormal glucose: Secondary | ICD-10-CM

## 2012-09-14 DIAGNOSIS — R221 Localized swelling, mass and lump, neck: Secondary | ICD-10-CM | POA: Diagnosis not present

## 2012-09-14 DIAGNOSIS — M899 Disorder of bone, unspecified: Secondary | ICD-10-CM

## 2012-09-14 DIAGNOSIS — G609 Hereditary and idiopathic neuropathy, unspecified: Secondary | ICD-10-CM

## 2012-09-14 DIAGNOSIS — E039 Hypothyroidism, unspecified: Secondary | ICD-10-CM

## 2012-09-14 DIAGNOSIS — G629 Polyneuropathy, unspecified: Secondary | ICD-10-CM

## 2012-09-14 DIAGNOSIS — I10 Essential (primary) hypertension: Secondary | ICD-10-CM

## 2012-09-14 DIAGNOSIS — M8580 Other specified disorders of bone density and structure, unspecified site: Secondary | ICD-10-CM

## 2012-09-14 DIAGNOSIS — R739 Hyperglycemia, unspecified: Secondary | ICD-10-CM

## 2012-09-14 MED ORDER — TEMAZEPAM 30 MG PO CAPS: 30.0000 mg | ORAL_CAPSULE | Freq: Every evening | ORAL | Status: DC | PRN

## 2012-09-14 NOTE — Progress Notes (Signed)
Subjective:    Patient ID: Julie Hoover, female    DOB: 12/31/1929, 79 y.o.   MRN: [id number]  HPI 79 year old female with past history of hypertension, hypercholesterolemia, hyperglycemia and breast cancer who comes in today to follow up on these issues as well as for a complete physical exam.   AM sugars averaging 90-100 and pm sugars - 120-140s.  Bowels stable.  No chest pain or tightness.  No increased sob.  Breathing stable.  Saw Dr Greggory Keen.  Had a follow up pelvic ultrasound.  States everything checked out fine.  No abdominal pain or cramping.  Gabapentin controlling neuropathic discomfort.  Gait stable.  Blood pressure has been doing well.  Blood pressure averaging 118-128/60-70.    Past Medical History  Diagnosis Date  . Hypertension   . Hypercholesterolemia   . Hyperglycemia   . Hypothyroidism   . Breast cancer 1986    s/p right lumpectomy, XRT and Tamoxifen  . Diverticulosis   . Environmental allergies   . Arthritis   . GERD (gastroesophageal reflux disease)   . Glaucoma   . Colon polyps   . Chicken pox     Outpatient Encounter Prescriptions as of 09/14/2012  Medication Sig Dispense Refill  . amLODipine (NORVASC) 5 MG tablet Take 1 tablet (5 mg total) by mouth daily.  90 tablet  1  . aspirin 81 MG tablet Take 81 mg by mouth daily.      . bimatoprost (LUMIGAN) 0.01 % SOLN 1 drop at bedtime.      . Calcium Carbonate-Vitamin D (CALCIUM 600+D) 600-400 MG-UNIT per tablet Take 1 tablet by mouth 3 (three) times daily.       . cetirizine (ZYRTEC) 10 MG tablet Take 10 mg by mouth daily.      . Coenzyme Q10 (COQ10) 400 MG CAPS Take by mouth 2 (two) times daily.       . Cyanocobalamin (VITAMIN B-12 IJ) Inject as directed once. monthly      . gabapentin (NEURONTIN) 100 MG capsule Take 1 capsule (100 mg total) by mouth 3 (three) times daily.  270 capsule  1  . glucosamine-chondroitin 500-400 MG tablet Take 1 tablet by mouth 2 (two) times daily.      Marland Kitchen ibandronate (BONIVA) 150 MG  tablet Take 150 mg by mouth every 30 (thirty) days. Take in the morning with a full glass of water, on an empty stomach, and do not take anything else by mouth or lie down for the next 30 min.      . latanoprost (XALATAN) 0.005 % ophthalmic solution Place 1 drop into both eyes at bedtime.      Marland Kitchen levothyroxine (SYNTHROID, LEVOTHROID) 50 MCG tablet Take 1 tablet (50 mcg total) by mouth daily.  90 tablet  1  . omeprazole (PRILOSEC) 20 MG capsule Take 1 capsule (20 mg total) by mouth 2 (two) times daily.  180 capsule  1  . simvastatin (ZOCOR) 10 MG tablet Take 10 mg by mouth daily.      . temazepam (RESTORIL) 30 MG capsule Take 30 mg by mouth at bedtime as needed.      . timolol (BETIMOL) 0.25 % ophthalmic solution Place 1 drop into both eyes 2 (two) times daily.      . zoledronic acid (RECLAST) 5 MG/100ML SOLN Inject 5 mg into the vein once.       No facility-administered encounter medications on file as of 09/14/2012.    Review of Systems Patient denies any headache, lightheadedness  or dizziness.  No significant sinus or allergy problems. No chest pain, tightness or palpitations.  No increased shortness of breath, cough or congestion.  No nausea or vomiting.   No increased acid reflux.  No abdominal pain or cramping.  No bowel change, such as diarrhea, constipation, BRBPR or melana.  No urine change.  Due a follow up eye exam 8/14.     Objective:   Physical Exam  Filed Vitals:   09/14/12 0928  BP: 120/64  Pulse: 59  Temp: 98 F (37.77 C)   79 year old female in no acute distress.   HEENT:  Nares- clear.  Oropharynx - without lesions. NECK:  Supple.  Nontender.  No audible bruit.  Increased tissue fullness (soft tissue) - base of neck. Non tender.  No palpable lymphadenopathy.   HEART:  Appears to be regular. LUNGS:  No crackles or wheezing audible.  Respirations even and unlabored.  RADIAL PULSE:  Equal bilaterally.    BREASTS:  No nipple discharge or nipple retraction present.  Could not  appreciate any distinct nodules or axillary adenopathy left.  S/p right mastectomy.  ABDOMEN:  Soft, nontender.  Bowel sounds present and normal.  No audible abdominal bruit.  GU:  Normal external genitalia.  Vaginal vault without lesions.  Could not appreciate any adnexal masses or tenderness.   RECTAL:  Heme negative.   EXTREMITIES:  No increased edema present.  DP pulses palpable and equal bilaterally.          Assessment & Plan:  DIFFICULTY SLEEPING.  Takes Temazepam prn.  Follow.  Refilled today.   SOFT TISSUE FULLNESS (NECK/ANTERIOR CHEST).  Non tender.  Does not appear to be changing.  Pt request referral to Dr Katrinka Blazing for evaluation and to confirm no further w/up warranted.    HEALTH MAINTENANCE.  Physical today.  Colonoscopy 08/20/09 - diverticulosis.  Mammogram 09/29/11 - Birads II.  Schedule a follow up mammogram when due.

## 2012-09-20 ENCOUNTER — Encounter: Payer: Self-pay | Admitting: Internal Medicine

## 2012-09-20 NOTE — Assessment & Plan Note (Signed)
S/p right lumpectomy 1986 with XRT and Tamoxifen 1986.  S/p mastectomy (right) for reoccurrence 1993.  Mammogram 09/29/11 - BiRADS II.  Schedule a follow up mammogram when due.

## 2012-09-20 NOTE — Assessment & Plan Note (Signed)
Blood pressure under good control.  Same medication.  Follow metabolic panel.   

## 2012-09-20 NOTE — Assessment & Plan Note (Signed)
On Gabapentin.  Symptoms controlled.  Follow.

## 2012-09-20 NOTE — Assessment & Plan Note (Signed)
Last bone density 06/27/10 revealed osteopenia.  Decreased from 2009.  Vitamin D 06/08/12 wnl.  Continue calcium and vitamin D and weight bearing exercise.  Receiving Reclast.  Due next reclast - 05/2013.

## 2012-09-20 NOTE — Assessment & Plan Note (Signed)
On synthroid.  Follow tsh.   

## 2012-09-20 NOTE — Assessment & Plan Note (Signed)
On Simvastatin.  Low cholesterol diet and exercise.  Lipid panel 09/09/12 revealed total cholesterol 171, triglycerides 75, HDL 53 and LDL 103.  Follow.

## 2012-09-20 NOTE — Assessment & Plan Note (Signed)
Sugars as outlined.  Low carb diet.  Exercise.  A1c 09/09/12 - 7.0.  Follow closely.

## 2012-09-28 ENCOUNTER — Ambulatory Visit (INDEPENDENT_AMBULATORY_CARE_PROVIDER_SITE_OTHER): Payer: Medicare Other | Admitting: *Deleted

## 2012-09-28 DIAGNOSIS — E538 Deficiency of other specified B group vitamins: Secondary | ICD-10-CM

## 2012-09-28 MED ORDER — CYANOCOBALAMIN 1000 MCG/ML IJ SOLN
1000.0000 ug | Freq: Once | INTRAMUSCULAR | Status: AC
  Administered 2012-09-28: 1000 ug via INTRAMUSCULAR

## 2012-10-25 DIAGNOSIS — D179 Benign lipomatous neoplasm, unspecified: Secondary | ICD-10-CM | POA: Diagnosis not present

## 2012-10-28 ENCOUNTER — Telehealth: Payer: Self-pay | Admitting: Internal Medicine

## 2012-10-28 DIAGNOSIS — C50911 Malignant neoplasm of unspecified site of right female breast: Secondary | ICD-10-CM

## 2012-10-28 NOTE — Telephone Encounter (Signed)
Order placed.  See messaging.

## 2012-10-28 NOTE — Telephone Encounter (Signed)
[facility] is calling to get a Mammo order for Unilateral of the left breast for history of breast cancer and right mastectomy. Pt's appt is tomorrow at 2 pm. Please fax to [phone]

## 2012-10-29 ENCOUNTER — Ambulatory Visit: Payer: Self-pay | Admitting: Internal Medicine

## 2012-10-29 ENCOUNTER — Encounter: Payer: Self-pay | Admitting: *Deleted

## 2012-10-29 DIAGNOSIS — N63 Unspecified lump in unspecified breast: Secondary | ICD-10-CM | POA: Diagnosis not present

## 2012-10-29 DIAGNOSIS — Z853 Personal history of malignant neoplasm of breast: Secondary | ICD-10-CM | POA: Diagnosis not present

## 2012-10-29 DIAGNOSIS — Z9010 Acquired absence of unspecified breast and nipple: Secondary | ICD-10-CM | POA: Diagnosis not present

## 2012-11-02 ENCOUNTER — Ambulatory Visit (INDEPENDENT_AMBULATORY_CARE_PROVIDER_SITE_OTHER): Payer: Medicare Other | Admitting: *Deleted

## 2012-11-02 DIAGNOSIS — E538 Deficiency of other specified B group vitamins: Secondary | ICD-10-CM | POA: Diagnosis not present

## 2012-11-02 MED ORDER — CYANOCOBALAMIN 1000 MCG/ML IJ SOLN
1000.0000 ug | Freq: Once | INTRAMUSCULAR | Status: AC
  Administered 2012-11-02: 1000 ug via INTRAMUSCULAR

## 2012-11-04 ENCOUNTER — Other Ambulatory Visit: Payer: Self-pay | Admitting: *Deleted

## 2012-11-04 MED ORDER — AMLODIPINE BESYLATE 5 MG PO TABS: 5.0000 mg | ORAL_TABLET | Freq: Every day | ORAL | Status: DC

## 2012-11-11 ENCOUNTER — Encounter: Payer: Self-pay | Admitting: Internal Medicine

## 2012-11-23 ENCOUNTER — Telehealth: Payer: Self-pay | Admitting: Internal Medicine

## 2012-11-23 MED ORDER — AMLODIPINE BESYLATE 5 MG PO TABS: 5.0000 mg | ORAL_TABLET | Freq: Every day | ORAL | Status: DC

## 2012-11-23 NOTE — Telephone Encounter (Signed)
Amlodipine 5 mg refill needed.  Pt states she has spoken with [facility] and they informed her that she had no more refills.

## 2012-11-23 NOTE — Telephone Encounter (Signed)
Rx filled

## 2012-11-24 ENCOUNTER — Other Ambulatory Visit: Payer: Self-pay

## 2012-12-03 ENCOUNTER — Other Ambulatory Visit: Payer: Self-pay | Admitting: *Deleted

## 2012-12-03 ENCOUNTER — Ambulatory Visit (INDEPENDENT_AMBULATORY_CARE_PROVIDER_SITE_OTHER): Payer: Medicare Other | Admitting: *Deleted

## 2012-12-03 DIAGNOSIS — E538 Deficiency of other specified B group vitamins: Secondary | ICD-10-CM

## 2012-12-03 MED ORDER — LEVOTHYROXINE SODIUM 50 MCG PO TABS: 50.0000 ug | ORAL_TABLET | Freq: Every day | ORAL | Status: DC

## 2012-12-03 MED ORDER — CYANOCOBALAMIN 1000 MCG/ML IJ SOLN
1000.0000 ug | Freq: Once | INTRAMUSCULAR | Status: AC
  Administered 2012-12-03: 1000 ug via INTRAMUSCULAR

## 2012-12-09 DIAGNOSIS — H4010X0 Unspecified open-angle glaucoma, stage unspecified: Secondary | ICD-10-CM | POA: Diagnosis not present

## 2012-12-13 DIAGNOSIS — L570 Actinic keratosis: Secondary | ICD-10-CM | POA: Diagnosis not present

## 2012-12-13 DIAGNOSIS — Z1283 Encounter for screening for malignant neoplasm of skin: Secondary | ICD-10-CM | POA: Diagnosis not present

## 2012-12-13 DIAGNOSIS — H61009 Unspecified perichondritis of external ear, unspecified ear: Secondary | ICD-10-CM | POA: Diagnosis not present

## 2012-12-13 DIAGNOSIS — B353 Tinea pedis: Secondary | ICD-10-CM | POA: Diagnosis not present

## 2012-12-17 ENCOUNTER — Encounter: Payer: Self-pay | Admitting: *Deleted

## 2012-12-24 DIAGNOSIS — H2510 Age-related nuclear cataract, unspecified eye: Secondary | ICD-10-CM | POA: Diagnosis not present

## 2013-01-04 ENCOUNTER — Ambulatory Visit: Payer: Medicare Other

## 2013-01-05 ENCOUNTER — Ambulatory Visit: Payer: Self-pay | Admitting: Ophthalmology

## 2013-01-05 DIAGNOSIS — I10 Essential (primary) hypertension: Secondary | ICD-10-CM | POA: Diagnosis not present

## 2013-01-05 DIAGNOSIS — E119 Type 2 diabetes mellitus without complications: Secondary | ICD-10-CM | POA: Diagnosis not present

## 2013-01-05 DIAGNOSIS — M129 Arthropathy, unspecified: Secondary | ICD-10-CM | POA: Diagnosis not present

## 2013-01-05 DIAGNOSIS — Z7982 Long term (current) use of aspirin: Secondary | ICD-10-CM | POA: Diagnosis not present

## 2013-01-05 DIAGNOSIS — Z85828 Personal history of other malignant neoplasm of skin: Secondary | ICD-10-CM | POA: Diagnosis not present

## 2013-01-05 DIAGNOSIS — Z882 Allergy status to sulfonamides status: Secondary | ICD-10-CM | POA: Diagnosis not present

## 2013-01-05 DIAGNOSIS — Z79899 Other long term (current) drug therapy: Secondary | ICD-10-CM | POA: Diagnosis not present

## 2013-01-05 DIAGNOSIS — Z853 Personal history of malignant neoplasm of breast: Secondary | ICD-10-CM | POA: Diagnosis not present

## 2013-01-05 DIAGNOSIS — H259 Unspecified age-related cataract: Secondary | ICD-10-CM | POA: Diagnosis not present

## 2013-01-05 DIAGNOSIS — G589 Mononeuropathy, unspecified: Secondary | ICD-10-CM | POA: Diagnosis not present

## 2013-01-05 DIAGNOSIS — M810 Age-related osteoporosis without current pathological fracture: Secondary | ICD-10-CM | POA: Diagnosis not present

## 2013-01-05 DIAGNOSIS — K219 Gastro-esophageal reflux disease without esophagitis: Secondary | ICD-10-CM | POA: Diagnosis not present

## 2013-01-05 DIAGNOSIS — H2510 Age-related nuclear cataract, unspecified eye: Secondary | ICD-10-CM | POA: Diagnosis not present

## 2013-01-12 ENCOUNTER — Encounter: Payer: Self-pay | Admitting: Internal Medicine

## 2013-01-12 ENCOUNTER — Ambulatory Visit (INDEPENDENT_AMBULATORY_CARE_PROVIDER_SITE_OTHER): Payer: Medicare Other | Admitting: *Deleted

## 2013-01-12 ENCOUNTER — Other Ambulatory Visit (INDEPENDENT_AMBULATORY_CARE_PROVIDER_SITE_OTHER): Payer: Medicare Other

## 2013-01-12 DIAGNOSIS — I10 Essential (primary) hypertension: Secondary | ICD-10-CM | POA: Diagnosis not present

## 2013-01-12 DIAGNOSIS — E7800 Pure hypercholesterolemia, unspecified: Secondary | ICD-10-CM | POA: Diagnosis not present

## 2013-01-12 DIAGNOSIS — R739 Hyperglycemia, unspecified: Secondary | ICD-10-CM

## 2013-01-12 DIAGNOSIS — Z23 Encounter for immunization: Secondary | ICD-10-CM

## 2013-01-12 DIAGNOSIS — E538 Deficiency of other specified B group vitamins: Secondary | ICD-10-CM

## 2013-01-12 DIAGNOSIS — R7309 Other abnormal glucose: Secondary | ICD-10-CM | POA: Diagnosis not present

## 2013-01-12 LAB — BASIC METABOLIC PANEL
CO2: 28 mEq/L (ref 19–32)
Calcium: 9.5 mg/dL (ref 8.4–10.5)
Creatinine, Ser: 1 mg/dL (ref 0.4–1.2)
Glucose, Bld: 111 mg/dL — ABNORMAL HIGH (ref 70–99)

## 2013-01-12 LAB — HEPATIC FUNCTION PANEL
AST: 15 U/L (ref 0–37)
Alkaline Phosphatase: 49 U/L (ref 39–117)
Total Bilirubin: 0.6 mg/dL (ref 0.3–1.2)

## 2013-01-12 LAB — LIPID PANEL: Total CHOL/HDL Ratio: 3

## 2013-01-12 MED ORDER — CYANOCOBALAMIN 1000 MCG/ML IJ SOLN
1000.0000 ug | Freq: Once | INTRAMUSCULAR | Status: AC
  Administered 2013-01-12: 1000 ug via INTRAMUSCULAR

## 2013-01-17 ENCOUNTER — Ambulatory Visit (INDEPENDENT_AMBULATORY_CARE_PROVIDER_SITE_OTHER): Payer: Medicare Other | Admitting: Internal Medicine

## 2013-01-17 ENCOUNTER — Encounter: Payer: Self-pay | Admitting: Internal Medicine

## 2013-01-17 ENCOUNTER — Encounter: Payer: Self-pay | Admitting: *Deleted

## 2013-01-17 VITALS — BP 130/78 | HR 68 | Temp 98.0°F | Ht 64.3 in | Wt 170.8 lb

## 2013-01-17 DIAGNOSIS — E7800 Pure hypercholesterolemia, unspecified: Secondary | ICD-10-CM

## 2013-01-17 DIAGNOSIS — M899 Disorder of bone, unspecified: Secondary | ICD-10-CM | POA: Diagnosis not present

## 2013-01-17 DIAGNOSIS — M8580 Other specified disorders of bone density and structure, unspecified site: Secondary | ICD-10-CM

## 2013-01-17 DIAGNOSIS — G629 Polyneuropathy, unspecified: Secondary | ICD-10-CM

## 2013-01-17 DIAGNOSIS — Z23 Encounter for immunization: Secondary | ICD-10-CM

## 2013-01-17 DIAGNOSIS — E039 Hypothyroidism, unspecified: Secondary | ICD-10-CM

## 2013-01-17 DIAGNOSIS — E119 Type 2 diabetes mellitus without complications: Secondary | ICD-10-CM

## 2013-01-17 DIAGNOSIS — G609 Hereditary and idiopathic neuropathy, unspecified: Secondary | ICD-10-CM

## 2013-01-17 DIAGNOSIS — I10 Essential (primary) hypertension: Secondary | ICD-10-CM | POA: Diagnosis not present

## 2013-01-17 DIAGNOSIS — C50919 Malignant neoplasm of unspecified site of unspecified female breast: Secondary | ICD-10-CM

## 2013-01-17 LAB — HM DIABETES FOOT EXAM

## 2013-01-17 NOTE — Assessment & Plan Note (Signed)
Blood pressure under good control.  Same medication.  Follow metabolic panel.   

## 2013-01-17 NOTE — Addendum Note (Signed)
Addended by: Charm Barges on: 01/17/2013 06:44 PM   Modules accepted: Orders

## 2013-01-17 NOTE — Progress Notes (Signed)
Subjective:    Patient ID: Julie Hoover, female    DOB: 1929/04/30, 79 y.o.   MRN: [id number]  HPI 79 year old female with past history of hypertension, hypercholesterolemia, hyperglycemia and breast cancer who comes in today for a scheduled follow up.  Recent A1c slightly increased above last check - 7.2.   AM sugars averaging 90-100 and pm sugars - 120-140s.  See attached list for details.  Bowels stable.  No chest pain or tightness.  No increased sob.  Breathing stable.  Saw Dr Greggory Keen.  Had a follow up pelvic ultrasound.  States everything checked out fine.  No abdominal pain or cramping.  Gabapentin controlling neuropathic discomfort.  Gait may be a little worse, overall relatively stable.   Blood pressure has been doing well.    Past Medical History  Diagnosis Date  . Hypertension   . Hypercholesterolemia   . Hyperglycemia   . Hypothyroidism   . Breast cancer 1986    s/p right lumpectomy, XRT and Tamoxifen  . Diverticulosis   . Environmental allergies   . Arthritis   . GERD (gastroesophageal reflux disease)   . Glaucoma   . Colon polyps   . Chicken pox     Outpatient Encounter Prescriptions as of 01/17/2013  Medication Sig Dispense Refill  . amLODipine (NORVASC) 5 MG tablet Take 1 tablet (5 mg total) by mouth daily.  90 tablet  1  . aspirin 81 MG tablet Take 81 mg by mouth daily.      . bimatoprost (LUMIGAN) 0.01 % SOLN 1 drop at bedtime.      . Calcium Carbonate-Vitamin D (CALCIUM 600+D) 600-400 MG-UNIT per tablet Take 1 tablet by mouth 3 (three) times daily.       . cetirizine (ZYRTEC) 10 MG tablet Take 10 mg by mouth daily.      . Coenzyme Q10 (COQ10) 400 MG CAPS Take by mouth 2 (two) times daily.       . Cyanocobalamin (VITAMIN B-12 IJ) Inject as directed once. monthly      . latanoprost (XALATAN) 0.005 % ophthalmic solution Place 1 drop into both eyes at bedtime.      Marland Kitchen levothyroxine (SYNTHROID, LEVOTHROID) 50 MCG tablet Take 1 tablet (50 mcg total) by mouth daily.   90 tablet  3  . omeprazole (PRILOSEC) 20 MG capsule Take 1 capsule (20 mg total) by mouth 2 (two) times daily.  180 capsule  1  . simvastatin (ZOCOR) 10 MG tablet Take 10 mg by mouth daily.      . temazepam (RESTORIL) 30 MG capsule Take 1 capsule (30 mg total) by mouth at bedtime as needed.  30 capsule  0  . timolol (BETIMOL) 0.25 % ophthalmic solution Place 1 drop into both eyes 2 (two) times daily.      . zoledronic acid (RECLAST) 5 MG/100ML SOLN Inject 5 mg into the vein once.      . [DISCONTINUED] gabapentin (NEURONTIN) 100 MG capsule Take 1 capsule (100 mg total) by mouth 3 (three) times daily.  270 capsule  1  . [DISCONTINUED] ibandronate (BONIVA) 150 MG tablet Take 150 mg by mouth every 30 (thirty) days. Take in the morning with a full glass of water, on an empty stomach, and do not take anything else by mouth or lie down for the next 30 min.      . [DISCONTINUED] glucosamine-chondroitin 500-400 MG tablet Take 1 tablet by mouth 2 (two) times daily.       No facility-administered encounter  medications on file as of 01/17/2013.    Review of Systems Patient denies any headache, lightheadedness or dizziness.  No significant sinus or allergy problems. No chest pain, tightness or palpitations.  No increased shortness of breath, cough or congestion.  No nausea or vomiting.   No increased acid reflux.  No abdominal pain or cramping.  No bowel change, such as diarrhea, constipation, BRBPR or melana.  No urine change.  Had a recent eye exam.  S/p right eye cataract surgery two weeks ago.  Planing to have the left eye done in the future.       Objective:   Physical Exam  Filed Vitals:   01/17/13 0750  BP: 130/78  Pulse: 68  Temp: 98 F (67.25 C)   79 year old female in no acute distress.   HEENT:  Nares- clear.  Oropharynx - without lesions. NECK:  Supple.  Nontender.  No audible bruit.  Increased tissue fullness (soft tissue) - base of neck. Non tender.  No palpable lymphadenopathy.   HEART:   Appears to be regular. LUNGS:  No crackles or wheezing audible.  Respirations even and unlabored.  RADIAL PULSE:  Equal bilaterally.  ABDOMEN:  Soft, nontender.  Bowel sounds present and normal.  No audible abdominal bruit.    EXTREMITIES:  No increased edema present.  DP pulses palpable and equal bilaterally.          Assessment & Plan:  DIFFICULTY SLEEPING.  Takes Temazepam prn.  Follow.  Rarely takes.   SOFT TISSUE FULLNESS (NECK/ANTERIOR CHEST).  Non tender.  Saw Dr Katrinka Blazing.  Diagnosed with lipoma.  Recommended following.     HEALTH MAINTENANCE.  Physical 09/14/12.    Colonoscopy 08/20/09 - diverticulosis.  Mammogram 10/29/12 - Birads I.

## 2013-01-17 NOTE — Assessment & Plan Note (Signed)
On Simvastatin.  Low cholesterol diet and exercise.  Lipid panel 01/12/13 revealed total cholesterol 185, triglycerides 67, HDL 64 and LDL 108.  Follow.

## 2013-01-17 NOTE — Assessment & Plan Note (Signed)
On Gabapentin.  Symptoms controlled.  Follow.

## 2013-01-17 NOTE — Assessment & Plan Note (Signed)
Last bone density 06/27/10 revealed osteopenia.  Decreased from 2009.  Vitamin D 06/08/12 wnl.  Continue calcium and vitamin D and weight bearing exercise.  Receiving Reclast.  Due next reclast - 05/2013.

## 2013-01-17 NOTE — Assessment & Plan Note (Signed)
S/p right lumpectomy 1986 with XRT and Tamoxifen 1986.  S/p mastectomy (right) for reoccurrence 1993.  Mammogram 10/29/12 - BiRADS I.      

## 2013-01-17 NOTE — Assessment & Plan Note (Signed)
On synthroid.  Follow tsh.   

## 2013-01-17 NOTE — Assessment & Plan Note (Signed)
Sugars as outlined.  Low carb diet.  Exercise.  A1c 01/12/13 - 7.2.  Follow closely.  Start exercising.

## 2013-01-24 DIAGNOSIS — H2510 Age-related nuclear cataract, unspecified eye: Secondary | ICD-10-CM | POA: Diagnosis not present

## 2013-01-26 ENCOUNTER — Ambulatory Visit (INDEPENDENT_AMBULATORY_CARE_PROVIDER_SITE_OTHER): Payer: Medicare Other | Admitting: *Deleted

## 2013-01-26 DIAGNOSIS — H6121 Impacted cerumen, right ear: Secondary | ICD-10-CM

## 2013-01-26 DIAGNOSIS — H6120 Impacted cerumen, unspecified ear: Secondary | ICD-10-CM | POA: Diagnosis not present

## 2013-02-02 ENCOUNTER — Ambulatory Visit: Payer: Self-pay | Admitting: Ophthalmology

## 2013-02-02 DIAGNOSIS — Z79899 Other long term (current) drug therapy: Secondary | ICD-10-CM | POA: Diagnosis not present

## 2013-02-02 DIAGNOSIS — Z882 Allergy status to sulfonamides status: Secondary | ICD-10-CM | POA: Diagnosis not present

## 2013-02-02 DIAGNOSIS — Z853 Personal history of malignant neoplasm of breast: Secondary | ICD-10-CM | POA: Diagnosis not present

## 2013-02-02 DIAGNOSIS — H259 Unspecified age-related cataract: Secondary | ICD-10-CM | POA: Diagnosis not present

## 2013-02-02 DIAGNOSIS — Z7982 Long term (current) use of aspirin: Secondary | ICD-10-CM | POA: Diagnosis not present

## 2013-02-02 DIAGNOSIS — K219 Gastro-esophageal reflux disease without esophagitis: Secondary | ICD-10-CM | POA: Diagnosis not present

## 2013-02-02 DIAGNOSIS — E7800 Pure hypercholesterolemia, unspecified: Secondary | ICD-10-CM | POA: Diagnosis not present

## 2013-02-02 DIAGNOSIS — M810 Age-related osteoporosis without current pathological fracture: Secondary | ICD-10-CM | POA: Diagnosis not present

## 2013-02-02 DIAGNOSIS — I10 Essential (primary) hypertension: Secondary | ICD-10-CM | POA: Diagnosis not present

## 2013-02-02 DIAGNOSIS — E119 Type 2 diabetes mellitus without complications: Secondary | ICD-10-CM | POA: Diagnosis not present

## 2013-02-02 DIAGNOSIS — G589 Mononeuropathy, unspecified: Secondary | ICD-10-CM | POA: Diagnosis not present

## 2013-02-02 DIAGNOSIS — M129 Arthropathy, unspecified: Secondary | ICD-10-CM | POA: Diagnosis not present

## 2013-02-02 DIAGNOSIS — H2510 Age-related nuclear cataract, unspecified eye: Secondary | ICD-10-CM | POA: Diagnosis not present

## 2013-02-07 ENCOUNTER — Other Ambulatory Visit: Payer: Self-pay | Admitting: *Deleted

## 2013-02-07 MED ORDER — SIMVASTATIN 10 MG PO TABS: 10.0000 mg | ORAL_TABLET | Freq: Every day | ORAL | Status: DC

## 2013-02-15 ENCOUNTER — Ambulatory Visit (INDEPENDENT_AMBULATORY_CARE_PROVIDER_SITE_OTHER): Payer: Medicare Other | Admitting: *Deleted

## 2013-02-15 DIAGNOSIS — E538 Deficiency of other specified B group vitamins: Secondary | ICD-10-CM | POA: Diagnosis not present

## 2013-02-15 MED ORDER — CYANOCOBALAMIN 1000 MCG/ML IJ SOLN
1000.0000 ug | Freq: Once | INTRAMUSCULAR | Status: AC
  Administered 2013-02-15: 1000 ug via INTRAMUSCULAR

## 2013-02-16 ENCOUNTER — Encounter: Payer: Self-pay | Admitting: Internal Medicine

## 2013-03-23 ENCOUNTER — Ambulatory Visit (INDEPENDENT_AMBULATORY_CARE_PROVIDER_SITE_OTHER): Payer: Medicare Other | Admitting: *Deleted

## 2013-03-23 DIAGNOSIS — E538 Deficiency of other specified B group vitamins: Secondary | ICD-10-CM | POA: Diagnosis not present

## 2013-03-23 MED ORDER — CYANOCOBALAMIN 1000 MCG/ML IJ SOLN
1000.0000 ug | Freq: Once | INTRAMUSCULAR | Status: AC
  Administered 2013-03-23: 1000 ug via INTRAMUSCULAR

## 2013-03-30 ENCOUNTER — Other Ambulatory Visit: Payer: Self-pay | Admitting: *Deleted

## 2013-03-30 MED ORDER — OMEPRAZOLE 20 MG PO CPDR: 20.0000 mg | DELAYED_RELEASE_CAPSULE | Freq: Two times a day (BID) | ORAL | Status: DC

## 2013-04-05 ENCOUNTER — Other Ambulatory Visit: Payer: Self-pay | Admitting: *Deleted

## 2013-04-05 MED ORDER — AMLODIPINE BESYLATE 5 MG PO TABS: 5.0000 mg | ORAL_TABLET | Freq: Every day | ORAL | Status: DC

## 2013-04-26 ENCOUNTER — Ambulatory Visit (INDEPENDENT_AMBULATORY_CARE_PROVIDER_SITE_OTHER): Payer: Medicare Other | Admitting: *Deleted

## 2013-04-26 DIAGNOSIS — E538 Deficiency of other specified B group vitamins: Secondary | ICD-10-CM | POA: Diagnosis not present

## 2013-04-26 MED ORDER — CYANOCOBALAMIN 1000 MCG/ML IJ SOLN
1000.0000 ug | Freq: Once | INTRAMUSCULAR | Status: AC
  Administered 2013-04-26: 1000 ug via INTRAMUSCULAR

## 2013-05-05 ENCOUNTER — Telehealth: Payer: Self-pay | Admitting: *Deleted

## 2013-05-05 NOTE — Telephone Encounter (Signed)
Refill request:  Gabapentin cap 100 mg  Take one by mouth three times daily

## 2013-05-10 ENCOUNTER — Telehealth: Payer: Self-pay | Admitting: *Deleted

## 2013-05-10 NOTE — Telephone Encounter (Signed)
Refill Request  Gabapentin cap 100 mg  Take one by mouth 3 times daily

## 2013-05-10 NOTE — Telephone Encounter (Signed)
Please confirm with her how taking and taking regularly.  I am ok with refills if taking regularly

## 2013-05-10 NOTE — Telephone Encounter (Signed)
Please advise to refill 

## 2013-05-11 MED ORDER — GABAPENTIN 100 MG PO CAPS: ORAL_CAPSULE | ORAL | Status: DC

## 2013-05-11 NOTE — Telephone Encounter (Signed)
Pt states that she takes one in the morning & 2 at bedtime. However, she still occasionally has the burning sensation in her feet at night, but not often.

## 2013-05-11 NOTE — Telephone Encounter (Signed)
I sent the rx to [facility].  I am not sure this is where she wanted it to go.  Thanks.

## 2013-05-11 NOTE — Telephone Encounter (Signed)
Pt notified & pharmacy correct

## 2013-05-12 ENCOUNTER — Other Ambulatory Visit (INDEPENDENT_AMBULATORY_CARE_PROVIDER_SITE_OTHER): Payer: Medicare Other

## 2013-05-12 DIAGNOSIS — C50919 Malignant neoplasm of unspecified site of unspecified female breast: Secondary | ICD-10-CM | POA: Diagnosis not present

## 2013-05-12 DIAGNOSIS — E119 Type 2 diabetes mellitus without complications: Secondary | ICD-10-CM

## 2013-05-12 DIAGNOSIS — E7800 Pure hypercholesterolemia, unspecified: Secondary | ICD-10-CM | POA: Diagnosis not present

## 2013-05-12 DIAGNOSIS — I10 Essential (primary) hypertension: Secondary | ICD-10-CM | POA: Diagnosis not present

## 2013-05-12 LAB — CBC WITH DIFFERENTIAL/PLATELET
Basophils Absolute: 0 10*3/uL (ref 0.0–0.1)
Basophils Relative: 0.4 % (ref 0.0–3.0)
EOS ABS: 0.1 10*3/uL (ref 0.0–0.7)
Eosinophils Relative: 0.9 % (ref 0.0–5.0)
HCT: 41.2 % (ref 36.0–46.0)
Hemoglobin: 13.6 g/dL (ref 12.0–15.0)
LYMPHS PCT: 22.1 % (ref 12.0–46.0)
Lymphs Abs: 1.7 10*3/uL (ref 0.7–4.0)
MCHC: 33.1 g/dL (ref 30.0–36.0)
MCV: 90.2 fl (ref 78.0–100.0)
Monocytes Absolute: 0.7 10*3/uL (ref 0.1–1.0)
Monocytes Relative: 9.1 % (ref 3.0–12.0)
NEUTROS PCT: 67.5 % (ref 43.0–77.0)
Neutro Abs: 5.2 10*3/uL (ref 1.4–7.7)
Platelets: 257 10*3/uL (ref 150.0–400.0)
RBC: 4.57 Mil/uL (ref 3.87–5.11)
RDW: 14.9 % — ABNORMAL HIGH (ref 11.5–14.6)
WBC: 7.8 10*3/uL (ref 4.5–10.5)

## 2013-05-12 LAB — BASIC METABOLIC PANEL
BUN: 14 mg/dL (ref 6–23)
CO2: 29 meq/L (ref 19–32)
CREATININE: 1 mg/dL (ref 0.4–1.2)
Calcium: 9.3 mg/dL (ref 8.4–10.5)
Chloride: 108 mEq/L (ref 96–112)
GFR: 59.92 mL/min — ABNORMAL LOW (ref >60.00)
Glucose, Bld: 111 mg/dL — ABNORMAL HIGH (ref 70–99)
Potassium: 4.3 mEq/L (ref 3.5–5.1)
Sodium: 143 mEq/L (ref 135–145)

## 2013-05-12 LAB — LIPID PANEL
CHOL/HDL RATIO: 3
Cholesterol: 169 mg/dL (ref 0–200)
HDL: 59.7 mg/dL (ref >39.00)
LDL Cholesterol: 96 mg/dL (ref 0–99)
TRIGLYCERIDES: 66 mg/dL (ref 0.0–149.0)
VLDL: 13.2 mg/dL (ref 0.0–40.0)

## 2013-05-12 LAB — HEMOGLOBIN A1C: HEMOGLOBIN A1C: 6.9 % — AB (ref 4.6–6.5)

## 2013-05-12 LAB — HEPATIC FUNCTION PANEL
ALBUMIN: 4.2 g/dL (ref 3.5–5.2)
ALT: 12 U/L (ref 0–35)
AST: 14 U/L (ref 0–37)
Alkaline Phosphatase: 54 U/L (ref 39–117)
Bilirubin, Direct: 0.1 mg/dL (ref 0.0–0.3)
Total Bilirubin: 0.6 mg/dL (ref 0.3–1.2)
Total Protein: 7.9 g/dL (ref 6.0–8.3)

## 2013-05-13 ENCOUNTER — Encounter: Payer: Self-pay | Admitting: Internal Medicine

## 2013-05-17 ENCOUNTER — Ambulatory Visit (INDEPENDENT_AMBULATORY_CARE_PROVIDER_SITE_OTHER): Payer: Medicare Other | Admitting: Internal Medicine

## 2013-05-17 ENCOUNTER — Encounter: Payer: Self-pay | Admitting: Internal Medicine

## 2013-05-17 ENCOUNTER — Encounter: Payer: Self-pay | Admitting: *Deleted

## 2013-05-17 VITALS — BP 130/82 | HR 70 | Temp 97.9°F | Ht 64.3 in | Wt 166.0 lb

## 2013-05-17 DIAGNOSIS — E039 Hypothyroidism, unspecified: Secondary | ICD-10-CM | POA: Diagnosis not present

## 2013-05-17 DIAGNOSIS — M899 Disorder of bone, unspecified: Secondary | ICD-10-CM | POA: Diagnosis not present

## 2013-05-17 DIAGNOSIS — E119 Type 2 diabetes mellitus without complications: Secondary | ICD-10-CM | POA: Diagnosis not present

## 2013-05-17 DIAGNOSIS — G629 Polyneuropathy, unspecified: Secondary | ICD-10-CM

## 2013-05-17 DIAGNOSIS — G609 Hereditary and idiopathic neuropathy, unspecified: Secondary | ICD-10-CM

## 2013-05-17 DIAGNOSIS — M949 Disorder of cartilage, unspecified: Secondary | ICD-10-CM

## 2013-05-17 DIAGNOSIS — I10 Essential (primary) hypertension: Secondary | ICD-10-CM

## 2013-05-17 DIAGNOSIS — N35919 Unspecified urethral stricture, male, unspecified site: Secondary | ICD-10-CM

## 2013-05-17 DIAGNOSIS — M8580 Other specified disorders of bone density and structure, unspecified site: Secondary | ICD-10-CM

## 2013-05-17 DIAGNOSIS — Z23 Encounter for immunization: Secondary | ICD-10-CM | POA: Diagnosis not present

## 2013-05-17 DIAGNOSIS — Z79899 Other long term (current) drug therapy: Secondary | ICD-10-CM | POA: Diagnosis not present

## 2013-05-17 DIAGNOSIS — R339 Retention of urine, unspecified: Secondary | ICD-10-CM

## 2013-05-17 DIAGNOSIS — C50919 Malignant neoplasm of unspecified site of unspecified female breast: Secondary | ICD-10-CM

## 2013-05-17 DIAGNOSIS — E7800 Pure hypercholesterolemia, unspecified: Secondary | ICD-10-CM

## 2013-05-17 LAB — URINALYSIS, ROUTINE W REFLEX MICROSCOPIC
Bilirubin Urine: NEGATIVE
HGB URINE DIPSTICK: NEGATIVE
KETONES UR: NEGATIVE
Leukocytes, UA: NEGATIVE
Nitrite: NEGATIVE
Specific Gravity, Urine: 1.015 (ref 1.000–1.030)
Total Protein, Urine: NEGATIVE
UROBILINOGEN UA: 0.2 (ref 0.0–1.0)
Urine Glucose: NEGATIVE
pH: 7 (ref 5.0–8.0)

## 2013-05-17 NOTE — Assessment & Plan Note (Addendum)
Last bone density 06/27/10 revealed osteopenia.  Decreased from 2009.  Vitamin D 06/08/12 wnl.  Continue calcium and vitamin D and weight bearing exercise.  Receiving Reclast.  Due next reclast - 05/2013. For completed for prior authorization.

## 2013-05-17 NOTE — Progress Notes (Signed)
Pre-visit discussion using our clinic review tool. No additional management support is needed unless otherwise documented below in the visit note.  

## 2013-05-18 ENCOUNTER — Encounter: Payer: Self-pay | Admitting: Internal Medicine

## 2013-05-18 LAB — VITAMIN D 25 HYDROXY (VIT D DEFICIENCY, FRACTURES): Vit D, 25-Hydroxy: 54 ng/mL (ref 30–89)

## 2013-05-19 ENCOUNTER — Encounter: Payer: Self-pay | Admitting: Internal Medicine

## 2013-05-19 DIAGNOSIS — Z87448 Personal history of other diseases of urinary system: Secondary | ICD-10-CM | POA: Insufficient documentation

## 2013-05-19 NOTE — Assessment & Plan Note (Signed)
S/p right lumpectomy 1986 with XRT and Tamoxifen 1986.  S/p mastectomy (right) for reoccurrence 1993.  Mammogram 10/29/12 - BiRADS I.

## 2013-05-19 NOTE — Assessment & Plan Note (Signed)
Has required dilatation previously.  Now with reoccurring symptoms.  Refer back to urology for evaluation and further treatment.

## 2013-05-19 NOTE — Progress Notes (Signed)
Subjective:    Patient ID: Julie Hoover, female    DOB: May 20, 1929, 80 y.o.   MRN: [id number]  HPI 80 year old female with past history of hypertension, hypercholesterolemia, hyperglycemia and breast cancer who comes in today for a scheduled follow up.  Recent A1c improved - 6.9.   AM sugars averaging 83-104 and pm sugars - 120-150s.  See attached list for details.  Bowels stable.  No chest pain or tightness.  No increased sob.  Breathing stable.  Saw Dr Enzo Bi.  Had a follow up pelvic ultrasound.  States everything checked out fine.  No abdominal pain or cramping.  Gabapentin helping neuropathic discomfort. Wants to change to taking three tablets before bed.  Does not have problems during the day.  Will hold the am dose or gabapentin.   Blood pressure has been doing well.  See attached list.  Had eye surgery 10/14.  Doing well.  Is trying to watch her diet more and has started exercising.  She has noticed having to strain with urination.  Has seen Dr Ernst Spell previously.  Is s/p dilatation.  Feels needs to be evaluated again.     Past Medical History  Diagnosis Date  . Hypertension   . Hypercholesterolemia   . Hyperglycemia   . Hypothyroidism   . Breast cancer 1986    s/p right lumpectomy, XRT and Tamoxifen  . Diverticulosis   . Environmental allergies   . Arthritis   . GERD (gastroesophageal reflux disease)   . Glaucoma   . Colon polyps   . Chicken pox     Outpatient Encounter Prescriptions as of 05/17/2013  Medication Sig  . amLODipine (NORVASC) 5 MG tablet Take 1 tablet (5 mg total) by mouth daily.  Marland Kitchen aspirin 81 MG tablet Take 81 mg by mouth daily.  . bimatoprost (LUMIGAN) 0.01 % SOLN 1 drop at bedtime.  . Calcium Carbonate-Vitamin D (CALCIUM 600+D) 600-400 MG-UNIT per tablet Take 1 tablet by mouth 3 (three) times daily.   . cetirizine (ZYRTEC) 10 MG tablet Take 10 mg by mouth daily.  . Coenzyme Q10 (COQ10) 400 MG CAPS Take by mouth 2 (two) times daily.   . Cyanocobalamin  (VITAMIN B-12 IJ) Inject as directed once. monthly  . gabapentin (NEURONTIN) 100 MG capsule Take one in the am and two q hs.  . levothyroxine (SYNTHROID, LEVOTHROID) 50 MCG tablet Take 1 tablet (50 mcg total) by mouth daily.  Marland Kitchen omeprazole (PRILOSEC) 20 MG capsule Take 1 capsule (20 mg total) by mouth 2 (two) times daily.  . simvastatin (ZOCOR) 10 MG tablet Take 1 tablet (10 mg total) by mouth daily.  . temazepam (RESTORIL) 30 MG capsule Take 1 capsule (30 mg total) by mouth at bedtime as needed.  . timolol (BETIMOL) 0.25 % ophthalmic solution Place 1 drop into both eyes 2 (two) times daily.  . zoledronic acid (RECLAST) 5 MG/100ML SOLN Inject 5 mg into the vein once.  . [DISCONTINUED] latanoprost (XALATAN) 0.005 % ophthalmic solution Place 1 drop into both eyes at bedtime.    Review of Systems Patient denies any headache, lightheadedness or dizziness.  No significant sinus or allergy problems. No chest pain, tightness or palpitations.  No increased shortness of breath, cough or congestion.  No nausea or vomiting.   No increased acid reflux.  No abdominal pain or cramping.  No bowel change, such as diarrhea, constipation, BRBPR or melana.   Describes the straining with urination as outlined.   Had a recent eye exam.  S/p  right and left eye cataract surgery.  Doing well.       Objective:   Physical Exam  Filed Vitals:   05/17/13 0909  BP: 130/82  Pulse: 70  Temp: 97.9 F (87.5 C)   80 year old female in no acute distress.   HEENT:  Nares- clear.  Oropharynx - without lesions. NECK:  Supple.  Nontender.  No audible bruit.    HEART:  Appears to be regular. LUNGS:  No crackles or wheezing audible.  Respirations even and unlabored.  RADIAL PULSE:  Equal bilaterally.  ABDOMEN:  Soft, nontender.  Bowel sounds present and normal.  No audible abdominal bruit.    EXTREMITIES:  No increased edema present.  DP pulses palpable and equal bilaterally.   FEET:  No lesions.          Assessment &  Plan:  DIFFICULTY SLEEPING.  Takes Temazepam prn.  Follow.  Rarely takes.   SOFT TISSUE FULLNESS (NECK/ANTERIOR CHEST).  Non tender.  Saw Dr Tamala Julian.  Diagnosed with lipoma.  Recommended following.     HEALTH MAINTENANCE.  Physical 09/14/12.    Colonoscopy 08/20/09 - diverticulosis.  Mammogram 10/29/12 - Birads I.

## 2013-05-19 NOTE — Assessment & Plan Note (Signed)
On synthroid.  Follow tsh.   

## 2013-05-19 NOTE — Assessment & Plan Note (Signed)
Blood pressure under good control.  Same medication.  Follow metabolic panel.   

## 2013-05-19 NOTE — Assessment & Plan Note (Signed)
Sugars as outlined.  Low carb diet.  Exercise.  A1c 05/12/13 - 6.9. .  Started exercising.  Watching her diet.  Up to date with eye exams.

## 2013-05-19 NOTE — Assessment & Plan Note (Signed)
On Gabapentin.  Will change gabapentin to 100mg  (three tablets q hs).  Follow.

## 2013-05-19 NOTE — Assessment & Plan Note (Signed)
On Simvastatin.  Low cholesterol diet and exercise.  Lipid panel 05/12/13 revealed total cholesterol 169, triglycerides 66, HDL 60 and LDL 96.  Follow.

## 2013-05-30 ENCOUNTER — Ambulatory Visit (INDEPENDENT_AMBULATORY_CARE_PROVIDER_SITE_OTHER): Payer: Medicare Other | Admitting: *Deleted

## 2013-05-30 DIAGNOSIS — E538 Deficiency of other specified B group vitamins: Secondary | ICD-10-CM | POA: Diagnosis not present

## 2013-05-30 MED ORDER — CYANOCOBALAMIN 1000 MCG/ML IJ SOLN
1000.0000 ug | Freq: Once | INTRAMUSCULAR | Status: AC
  Administered 2013-05-30: 1000 ug via INTRAMUSCULAR

## 2013-05-31 DIAGNOSIS — N35919 Unspecified urethral stricture, male, unspecified site: Secondary | ICD-10-CM | POA: Diagnosis not present

## 2013-06-03 ENCOUNTER — Encounter: Payer: Self-pay | Admitting: Internal Medicine

## 2013-06-27 ENCOUNTER — Other Ambulatory Visit: Payer: Self-pay | Admitting: *Deleted

## 2013-06-27 MED ORDER — SIMVASTATIN 10 MG PO TABS: 10.0000 mg | ORAL_TABLET | Freq: Every day | ORAL | Status: DC

## 2013-06-28 ENCOUNTER — Ambulatory Visit (INDEPENDENT_AMBULATORY_CARE_PROVIDER_SITE_OTHER): Payer: Medicare Other | Admitting: *Deleted

## 2013-06-28 DIAGNOSIS — E538 Deficiency of other specified B group vitamins: Secondary | ICD-10-CM

## 2013-06-28 MED ORDER — CYANOCOBALAMIN 1000 MCG/ML IJ SOLN
1000.0000 ug | Freq: Once | INTRAMUSCULAR | Status: AC
  Administered 2013-06-28: 1000 ug via INTRAMUSCULAR

## 2013-07-05 ENCOUNTER — Encounter: Payer: Self-pay | Admitting: Internal Medicine

## 2013-07-12 DIAGNOSIS — M810 Age-related osteoporosis without current pathological fracture: Secondary | ICD-10-CM | POA: Diagnosis not present

## 2013-07-29 ENCOUNTER — Ambulatory Visit (INDEPENDENT_AMBULATORY_CARE_PROVIDER_SITE_OTHER): Payer: Medicare Other | Admitting: *Deleted

## 2013-07-29 DIAGNOSIS — E538 Deficiency of other specified B group vitamins: Secondary | ICD-10-CM

## 2013-07-29 MED ORDER — CYANOCOBALAMIN 1000 MCG/ML IJ SOLN
1000.0000 ug | Freq: Once | INTRAMUSCULAR | Status: AC
  Administered 2013-07-29: 1000 ug via INTRAMUSCULAR

## 2013-08-04 DIAGNOSIS — H903 Sensorineural hearing loss, bilateral: Secondary | ICD-10-CM | POA: Diagnosis not present

## 2013-08-04 DIAGNOSIS — J301 Allergic rhinitis due to pollen: Secondary | ICD-10-CM | POA: Diagnosis not present

## 2013-08-04 DIAGNOSIS — H6120 Impacted cerumen, unspecified ear: Secondary | ICD-10-CM | POA: Diagnosis not present

## 2013-08-25 ENCOUNTER — Encounter: Payer: Self-pay | Admitting: Internal Medicine

## 2013-08-29 ENCOUNTER — Encounter: Payer: Self-pay | Admitting: Internal Medicine

## 2013-08-30 ENCOUNTER — Ambulatory Visit (INDEPENDENT_AMBULATORY_CARE_PROVIDER_SITE_OTHER): Payer: Medicare Other | Admitting: *Deleted

## 2013-08-30 DIAGNOSIS — E538 Deficiency of other specified B group vitamins: Secondary | ICD-10-CM

## 2013-08-30 MED ORDER — OMEPRAZOLE 40 MG PO CPDR: 40.0000 mg | DELAYED_RELEASE_CAPSULE | Freq: Every day | ORAL | Status: DC

## 2013-08-30 MED ORDER — CYANOCOBALAMIN 1000 MCG/ML IJ SOLN
1000.0000 ug | Freq: Once | INTRAMUSCULAR | Status: AC
  Administered 2013-08-30: 1000 ug via INTRAMUSCULAR

## 2013-08-30 NOTE — Telephone Encounter (Signed)
Refilled omeprazole 40mg  #90 with one refill.  Sent rx to [facility]

## 2013-09-05 ENCOUNTER — Encounter: Payer: Self-pay | Admitting: Internal Medicine

## 2013-09-09 DIAGNOSIS — H4010X0 Unspecified open-angle glaucoma, stage unspecified: Secondary | ICD-10-CM | POA: Diagnosis not present

## 2013-09-19 ENCOUNTER — Other Ambulatory Visit (INDEPENDENT_AMBULATORY_CARE_PROVIDER_SITE_OTHER): Payer: Medicare Other

## 2013-09-19 DIAGNOSIS — E039 Hypothyroidism, unspecified: Secondary | ICD-10-CM | POA: Diagnosis not present

## 2013-09-19 DIAGNOSIS — E119 Type 2 diabetes mellitus without complications: Secondary | ICD-10-CM | POA: Diagnosis not present

## 2013-09-19 DIAGNOSIS — E7800 Pure hypercholesterolemia, unspecified: Secondary | ICD-10-CM

## 2013-09-19 LAB — HEPATIC FUNCTION PANEL
ALK PHOS: 47 U/L (ref 39–117)
ALT: 14 U/L (ref 0–35)
AST: 16 U/L (ref 0–37)
Albumin: 4.1 g/dL (ref 3.5–5.2)
BILIRUBIN DIRECT: 0.1 mg/dL (ref 0.0–0.3)
BILIRUBIN TOTAL: 0.6 mg/dL (ref 0.2–1.2)
TOTAL PROTEIN: 7.5 g/dL (ref 6.0–8.3)

## 2013-09-19 LAB — BASIC METABOLIC PANEL
BUN: 17 mg/dL (ref 6–23)
CALCIUM: 9.6 mg/dL (ref 8.4–10.5)
CO2: 28 mEq/L (ref 19–32)
CREATININE: 1.1 mg/dL (ref 0.4–1.2)
Chloride: 106 mEq/L (ref 96–112)
GFR: 49.51 mL/min — AB (ref >60.00)
Glucose, Bld: 106 mg/dL — ABNORMAL HIGH (ref 70–99)
Potassium: 4.3 mEq/L (ref 3.5–5.1)
Sodium: 141 mEq/L (ref 135–145)

## 2013-09-19 LAB — MICROALBUMIN / CREATININE URINE RATIO
Creatinine,U: 80.1 mg/dL
Microalb Creat Ratio: 0.2 mg/g (ref 0.0–30.0)
Microalb, Ur: 0.2 mg/dL (ref 0.0–1.9)

## 2013-09-19 LAB — TSH: TSH: 3.28 u[IU]/mL (ref 0.35–4.50)

## 2013-09-19 LAB — HEMOGLOBIN A1C: Hgb A1c MFr Bld: 6.8 % — ABNORMAL HIGH (ref 4.6–6.5)

## 2013-09-19 LAB — LIPID PANEL
CHOLESTEROL: 172 mg/dL (ref 0–200)
HDL: 57.6 mg/dL (ref >39.00)
LDL Cholesterol: 99 mg/dL (ref 0–99)
Total CHOL/HDL Ratio: 3
Triglycerides: 75 mg/dL (ref 0.0–149.0)
VLDL: 15 mg/dL (ref 0.0–40.0)

## 2013-09-20 ENCOUNTER — Ambulatory Visit (INDEPENDENT_AMBULATORY_CARE_PROVIDER_SITE_OTHER): Payer: Medicare Other | Admitting: Internal Medicine

## 2013-09-20 ENCOUNTER — Encounter: Payer: Self-pay | Admitting: Internal Medicine

## 2013-09-20 VITALS — BP 120/60 | HR 62 | Temp 98.1°F | Ht 65.0 in | Wt 163.5 lb

## 2013-09-20 DIAGNOSIS — I10 Essential (primary) hypertension: Secondary | ICD-10-CM

## 2013-09-20 DIAGNOSIS — G629 Polyneuropathy, unspecified: Secondary | ICD-10-CM

## 2013-09-20 DIAGNOSIS — M949 Disorder of cartilage, unspecified: Secondary | ICD-10-CM

## 2013-09-20 DIAGNOSIS — G609 Hereditary and idiopathic neuropathy, unspecified: Secondary | ICD-10-CM

## 2013-09-20 DIAGNOSIS — E039 Hypothyroidism, unspecified: Secondary | ICD-10-CM

## 2013-09-20 DIAGNOSIS — C50919 Malignant neoplasm of unspecified site of unspecified female breast: Secondary | ICD-10-CM

## 2013-09-20 DIAGNOSIS — E7800 Pure hypercholesterolemia, unspecified: Secondary | ICD-10-CM | POA: Diagnosis not present

## 2013-09-20 DIAGNOSIS — E119 Type 2 diabetes mellitus without complications: Secondary | ICD-10-CM | POA: Diagnosis not present

## 2013-09-20 DIAGNOSIS — M8580 Other specified disorders of bone density and structure, unspecified site: Secondary | ICD-10-CM

## 2013-09-20 DIAGNOSIS — H409 Unspecified glaucoma: Secondary | ICD-10-CM

## 2013-09-20 DIAGNOSIS — M899 Disorder of bone, unspecified: Secondary | ICD-10-CM

## 2013-09-20 DIAGNOSIS — Z1211 Encounter for screening for malignant neoplasm of colon: Secondary | ICD-10-CM

## 2013-09-20 MED ORDER — TEMAZEPAM 30 MG PO CAPS: 30.0000 mg | ORAL_CAPSULE | Freq: Every evening | ORAL | Status: DC | PRN

## 2013-09-20 NOTE — Progress Notes (Signed)
Subjective:    Patient ID: Julie Hoover, female    DOB: 03-11-1930, 80 y.o.   MRN: [id number]  HPI 80 year old female with past history of hypertension, hypercholesterolemia, hyperglycemia and breast cancer who comes in today to follow up on these issues as well as for a complete physical exam.   Recent A1c improved - 6.8.   AM sugars averaging 83-90s and pm sugars - 116-150s two hours after supper.  See attached list for details.  Bowels stable.  No chest pain or tightness.  No increased sob.  Breathing stable.  Saw Dr Enzo Bi.  Had a follow up pelvic ultrasound.  States everything checked out fine.  No abdominal pain or cramping.  Gabapentin helping neuropathic discomfort.  Blood pressure has been doing well.  See attached list - averaging 112-128/60-70s.  Had eye surgery 10/14.  Plans to f/u with Dr Dorthula Nettles regarding her glaucoma.   Plans to start walking.  She desires another refill on temazapam.  Rarely uses.  Just saw Dr Richardson Landry for decreased hearing.  Resolved after having ear wax removed.      Past Medical History  Diagnosis Date  . Hypertension   . Hypercholesterolemia   . Hyperglycemia   . Hypothyroidism   . Breast cancer 1986    s/p right lumpectomy, XRT and Tamoxifen  . Diverticulosis   . Environmental allergies   . Arthritis   . GERD (gastroesophageal reflux disease)   . Glaucoma   . Colon polyps   . Chicken pox     Outpatient Encounter Prescriptions as of 09/20/2013  Medication Sig  . amLODipine (NORVASC) 5 MG tablet Take 1 tablet (5 mg total) by mouth daily.  Marland Kitchen aspirin 81 MG tablet Take 81 mg by mouth daily.  . bimatoprost (LUMIGAN) 0.01 % SOLN 1 drop at bedtime.  . Calcium Carbonate-Vitamin D (CALCIUM 600+D) 600-400 MG-UNIT per tablet Take 1 tablet by mouth 3 (three) times daily.   . cetirizine (ZYRTEC) 10 MG tablet Take 10 mg by mouth daily.  . Coenzyme Q10 (COQ10) 400 MG CAPS Take by mouth 2 (two) times daily.   . Cyanocobalamin (VITAMIN B-12 IJ) Inject as directed  once. monthly  . gabapentin (NEURONTIN) 100 MG capsule Take one in the am and two q hs.  . levothyroxine (SYNTHROID, LEVOTHROID) 50 MCG tablet Take 1 tablet (50 mcg total) by mouth daily.  Marland Kitchen omeprazole (PRILOSEC) 40 MG capsule Take 1 capsule (40 mg total) by mouth daily.  . simvastatin (ZOCOR) 10 MG tablet Take 1 tablet (10 mg total) by mouth daily.  . temazepam (RESTORIL) 30 MG capsule Take 1 capsule (30 mg total) by mouth at bedtime as needed.  . timolol (BETIMOL) 0.25 % ophthalmic solution Place 1 drop into both eyes 2 (two) times daily.  . zoledronic acid (RECLAST) 5 MG/100ML SOLN Inject 5 mg into the vein once.    Review of Systems Patient denies any headache, lightheadedness or dizziness.  No significant sinus or allergy problems. No chest pain, tightness or palpitations.  No increased shortness of breath, cough or congestion.  No nausea or vomiting.   No increased acid reflux.  No abdominal pain or cramping.  No bowel change, such as diarrhea, constipation, BRBPR or melana.   Had a recent eye exam.  S/p right and left eye cataract surgery.  Plans to f/u with Dr Dorthula Nettles regarding her glaucoma.  Sugars and blood pressure as outlined.       Objective:   Physical Exam  Filed Vitals:  09/20/13 1029  BP: 120/60  Pulse: 62  Temp: 98.1 F (36.7 C)   Blood pressure recheck:  32/19  80 year old female in no acute distress.   HEENT:  Nares- clear.  Oropharynx - without lesions. NECK:  Supple.  Nontender.  No audible bruit.  HEART:  Appears to be regular. LUNGS:  No crackles or wheezing audible.  Respirations even and unlabored.  RADIAL PULSE:  Equal bilaterally.    BREASTS:  S/p right mastectomy.  Left breast reveals no nipple discharge or nipple retraction present.  Could not appreciate any distinct nodules or axillary adenopathy.  ABDOMEN:  Soft, nontender.  Bowel sounds present and normal.  No audible abdominal bruit.  GU:  Noto performed.     EXTREMITIES:  No increased edema  present.  DP pulses palpable and equal bilaterally.      FEET:  Without lesions.       Assessment & Plan:  DIFFICULTY SLEEPING.  Takes Temazepam prn.  Follow.  Rarely takes.   SOFT TISSUE FULLNESS (NECK/ANTERIOR CHEST).  Non tender.  Saw Dr Tamala Julian.  Diagnosed with lipoma.  Recommended following.     HEALTH MAINTENANCE.  Physical today.    Colonoscopy 08/20/09 - diverticulosis.  Mammogram 10/29/12 - Birads I.  Schedule f/u mammogram when due.   I spent 25 minutes with the patient and more than 50% of the time was spent in consultation regarding the above.

## 2013-09-20 NOTE — Progress Notes (Signed)
Pre visit review using our clinic review tool, if applicable. No additional management support is needed unless otherwise documented below in the visit note. 

## 2013-09-25 ENCOUNTER — Encounter: Payer: Self-pay | Admitting: Internal Medicine

## 2013-09-25 DIAGNOSIS — H409 Unspecified glaucoma: Secondary | ICD-10-CM | POA: Insufficient documentation

## 2013-09-25 NOTE — Assessment & Plan Note (Signed)
Last bone density 06/27/10 revealed osteopenia.  Decreased from 2009.  Continue calcium and vitamin D and weight bearing exercise.  Received Reclast.  Schedule f/u bone density.

## 2013-09-25 NOTE — Assessment & Plan Note (Signed)
On Gabapentin.  Stable.  Follow.

## 2013-09-25 NOTE — Assessment & Plan Note (Signed)
Sugars as outlined.  Low carb diet.  Exercise.  A1c just checked 09/19/13  - 6.8.  Plans to start walking.  Watching her diet.  Up to date with eye exams.  Plans to start seeing Dr Dorthula Nettles for her glaucoma.

## 2013-09-25 NOTE — Assessment & Plan Note (Signed)
Blood pressure under good control.  Same medication.  Follow metabolic panel.

## 2013-09-25 NOTE — Assessment & Plan Note (Signed)
S/p right lumpectomy 1986 with XRT and Tamoxifen 1986.  S/p mastectomy (right) for reoccurrence 1993.  Mammogram 10/29/12 - BiRADS I.  Schedule f/u mammogram when due.

## 2013-09-25 NOTE — Assessment & Plan Note (Signed)
Planning to f/u with Dr Dorthula Nettles.

## 2013-09-25 NOTE — Assessment & Plan Note (Signed)
On Simvastatin.  Low cholesterol diet and exercise.  Lipid panel 09/19/13 revealed triglycerides 75 and LDL 99.  Follow.

## 2013-09-25 NOTE — Assessment & Plan Note (Signed)
On synthroid.  Follow tsh.   

## 2013-09-29 ENCOUNTER — Other Ambulatory Visit (INDEPENDENT_AMBULATORY_CARE_PROVIDER_SITE_OTHER): Payer: Medicare Other

## 2013-09-29 DIAGNOSIS — Z1211 Encounter for screening for malignant neoplasm of colon: Secondary | ICD-10-CM | POA: Diagnosis not present

## 2013-09-29 LAB — FECAL OCCULT BLOOD, IMMUNOCHEMICAL: Fecal Occult Bld: NEGATIVE

## 2013-09-30 ENCOUNTER — Encounter: Payer: Self-pay | Admitting: Internal Medicine

## 2013-10-04 ENCOUNTER — Ambulatory Visit (INDEPENDENT_AMBULATORY_CARE_PROVIDER_SITE_OTHER): Payer: Medicare Other | Admitting: *Deleted

## 2013-10-04 DIAGNOSIS — E538 Deficiency of other specified B group vitamins: Secondary | ICD-10-CM | POA: Diagnosis not present

## 2013-10-04 MED ORDER — CYANOCOBALAMIN 1000 MCG/ML IJ SOLN
1000.0000 ug | Freq: Once | INTRAMUSCULAR | Status: AC
  Administered 2013-10-04: 1000 ug via INTRAMUSCULAR

## 2013-10-05 ENCOUNTER — Other Ambulatory Visit: Payer: Self-pay | Admitting: *Deleted

## 2013-10-05 MED ORDER — GABAPENTIN 100 MG PO CAPS: ORAL_CAPSULE | ORAL | Status: DC

## 2013-10-05 MED ORDER — AMLODIPINE BESYLATE 5 MG PO TABS: 5.0000 mg | ORAL_TABLET | Freq: Every day | ORAL | Status: DC

## 2013-10-09 ENCOUNTER — Encounter: Payer: Self-pay | Admitting: Internal Medicine

## 2013-10-17 ENCOUNTER — Encounter: Payer: Self-pay | Admitting: Internal Medicine

## 2013-11-01 ENCOUNTER — Ambulatory Visit: Payer: Self-pay | Admitting: Internal Medicine

## 2013-11-01 ENCOUNTER — Encounter: Payer: Self-pay | Admitting: *Deleted

## 2013-11-01 DIAGNOSIS — E349 Endocrine disorder, unspecified: Secondary | ICD-10-CM | POA: Diagnosis not present

## 2013-11-01 DIAGNOSIS — M949 Disorder of cartilage, unspecified: Secondary | ICD-10-CM | POA: Diagnosis not present

## 2013-11-01 DIAGNOSIS — M899 Disorder of bone, unspecified: Secondary | ICD-10-CM | POA: Diagnosis not present

## 2013-11-01 DIAGNOSIS — Z1231 Encounter for screening mammogram for malignant neoplasm of breast: Secondary | ICD-10-CM | POA: Diagnosis not present

## 2013-11-01 LAB — HM DEXA SCAN: HM Dexa Scan: NORMAL

## 2013-11-01 LAB — HM MAMMOGRAPHY: HM Mammogram: NEGATIVE

## 2013-11-02 ENCOUNTER — Encounter: Payer: Self-pay | Admitting: *Deleted

## 2013-11-04 ENCOUNTER — Encounter: Payer: Self-pay | Admitting: Internal Medicine

## 2013-11-08 ENCOUNTER — Ambulatory Visit (INDEPENDENT_AMBULATORY_CARE_PROVIDER_SITE_OTHER): Payer: Medicare Other | Admitting: *Deleted

## 2013-11-08 DIAGNOSIS — E538 Deficiency of other specified B group vitamins: Secondary | ICD-10-CM

## 2013-11-08 MED ORDER — CYANOCOBALAMIN 1000 MCG/ML IJ SOLN
1000.0000 ug | Freq: Once | INTRAMUSCULAR | Status: AC
  Administered 2013-11-08: 1000 ug via INTRAMUSCULAR

## 2013-11-28 ENCOUNTER — Other Ambulatory Visit: Payer: Self-pay | Admitting: *Deleted

## 2013-11-28 MED ORDER — LEVOTHYROXINE SODIUM 50 MCG PO TABS: 50.0000 ug | ORAL_TABLET | Freq: Every day | ORAL | Status: DC

## 2013-12-09 DIAGNOSIS — H4010X0 Unspecified open-angle glaucoma, stage unspecified: Secondary | ICD-10-CM | POA: Diagnosis not present

## 2013-12-13 ENCOUNTER — Ambulatory Visit: Payer: Medicare Other

## 2013-12-13 DIAGNOSIS — H61009 Unspecified perichondritis of external ear, unspecified ear: Secondary | ICD-10-CM | POA: Diagnosis not present

## 2013-12-13 DIAGNOSIS — Z1283 Encounter for screening for malignant neoplasm of skin: Secondary | ICD-10-CM | POA: Diagnosis not present

## 2013-12-14 ENCOUNTER — Ambulatory Visit (INDEPENDENT_AMBULATORY_CARE_PROVIDER_SITE_OTHER): Payer: Medicare Other | Admitting: *Deleted

## 2013-12-14 DIAGNOSIS — E538 Deficiency of other specified B group vitamins: Secondary | ICD-10-CM

## 2013-12-14 DIAGNOSIS — Z23 Encounter for immunization: Secondary | ICD-10-CM | POA: Diagnosis not present

## 2013-12-14 MED ORDER — CYANOCOBALAMIN 1000 MCG/ML IJ SOLN
1000.0000 ug | Freq: Once | INTRAMUSCULAR | Status: AC
  Administered 2013-12-14: 1000 ug via INTRAMUSCULAR

## 2014-01-16 ENCOUNTER — Other Ambulatory Visit: Payer: Medicare Other

## 2014-01-17 ENCOUNTER — Ambulatory Visit (INDEPENDENT_AMBULATORY_CARE_PROVIDER_SITE_OTHER): Payer: Medicare Other | Admitting: *Deleted

## 2014-01-17 ENCOUNTER — Other Ambulatory Visit (INDEPENDENT_AMBULATORY_CARE_PROVIDER_SITE_OTHER): Payer: Medicare Other

## 2014-01-17 DIAGNOSIS — E538 Deficiency of other specified B group vitamins: Secondary | ICD-10-CM | POA: Diagnosis not present

## 2014-01-17 DIAGNOSIS — E119 Type 2 diabetes mellitus without complications: Secondary | ICD-10-CM

## 2014-01-17 DIAGNOSIS — E7800 Pure hypercholesterolemia, unspecified: Secondary | ICD-10-CM

## 2014-01-17 LAB — HEPATIC FUNCTION PANEL
ALT: 14 U/L (ref 0–35)
AST: 16 U/L (ref 0–37)
Albumin: 4.2 g/dL (ref 3.5–5.2)
Alkaline Phosphatase: 45 U/L (ref 39–117)
BILIRUBIN TOTAL: 0.8 mg/dL (ref 0.2–1.2)
Bilirubin, Direct: 0 mg/dL (ref 0.0–0.3)
Total Protein: 7.5 g/dL (ref 6.0–8.3)

## 2014-01-17 LAB — LIPID PANEL
Cholesterol: 176 mg/dL (ref 0–200)
HDL: 57.3 mg/dL (ref >39.00)
LDL CALC: 105 mg/dL — AB (ref 0–99)
NONHDL: 118.7
Total CHOL/HDL Ratio: 3
Triglycerides: 68 mg/dL (ref 0.0–149.0)
VLDL: 13.6 mg/dL (ref 0.0–40.0)

## 2014-01-17 LAB — HEMOGLOBIN A1C: Hgb A1c MFr Bld: 6.9 % — ABNORMAL HIGH (ref 4.6–6.5)

## 2014-01-17 LAB — BASIC METABOLIC PANEL
BUN: 18 mg/dL (ref 6–23)
CHLORIDE: 108 meq/L (ref 96–112)
CO2: 28 mEq/L (ref 19–32)
Calcium: 9.5 mg/dL (ref 8.4–10.5)
Creatinine, Ser: 1 mg/dL (ref 0.4–1.2)
GFR: 56.38 mL/min — ABNORMAL LOW (ref >60.00)
GLUCOSE: 109 mg/dL — AB (ref 70–99)
POTASSIUM: 4.4 meq/L (ref 3.5–5.1)
SODIUM: 141 meq/L (ref 135–145)

## 2014-01-17 MED ORDER — CYANOCOBALAMIN 1000 MCG/ML IJ SOLN
1000.0000 ug | Freq: Once | INTRAMUSCULAR | Status: AC
  Administered 2014-01-17: 1000 ug via INTRAMUSCULAR

## 2014-01-18 ENCOUNTER — Encounter: Payer: Self-pay | Admitting: Internal Medicine

## 2014-01-24 ENCOUNTER — Encounter: Payer: Self-pay | Admitting: Internal Medicine

## 2014-01-24 ENCOUNTER — Ambulatory Visit (INDEPENDENT_AMBULATORY_CARE_PROVIDER_SITE_OTHER): Payer: Medicare Other | Admitting: Internal Medicine

## 2014-01-24 VITALS — BP 130/70 | HR 66 | Temp 98.2°F | Ht 65.0 in | Wt 165.5 lb

## 2014-01-24 DIAGNOSIS — H409 Unspecified glaucoma: Secondary | ICD-10-CM

## 2014-01-24 DIAGNOSIS — E7800 Pure hypercholesterolemia, unspecified: Secondary | ICD-10-CM

## 2014-01-24 DIAGNOSIS — I10 Essential (primary) hypertension: Secondary | ICD-10-CM

## 2014-01-24 DIAGNOSIS — E039 Hypothyroidism, unspecified: Secondary | ICD-10-CM

## 2014-01-24 DIAGNOSIS — E119 Type 2 diabetes mellitus without complications: Secondary | ICD-10-CM

## 2014-01-24 DIAGNOSIS — M8580 Other specified disorders of bone density and structure, unspecified site: Secondary | ICD-10-CM

## 2014-01-24 DIAGNOSIS — G629 Polyneuropathy, unspecified: Secondary | ICD-10-CM

## 2014-01-24 DIAGNOSIS — C50919 Malignant neoplasm of unspecified site of unspecified female breast: Secondary | ICD-10-CM | POA: Diagnosis not present

## 2014-01-24 NOTE — Progress Notes (Signed)
Subjective:    Patient ID: Julie Hoover, female    DOB: [birth year], 80 y.o.   MRN: [id number]  HPI 80 year old female with past history of hypertension, hypercholesterolemia, hyperglycemia and breast cancer who comes in today for a scheduled follow up.  Recent A1c  6.9.   AM sugars averaging 80-90s and pm sugars - 110-140s two hours after supper.  See attached list for details.  Bowels stable.  No chest pain or tightness.  No increased sob.  Breathing stable.  Saw Dr Enzo Bi.  Had a follow up pelvic ultrasound.  States everything checked out fine.  No abdominal pain or cramping.  Gabapentin helping neuropathic discomfort.  Blood pressure has been doing well.   Had eye surgery 10/14.  Is s/p bilateral cataract surgery.  Now seeing Dr Dorthula Nettles regarding her glaucoma.  Pressure increased.  Planning for surgery next week for shunt placement for the increased pressure.   Plans to start walking more.  Still some decreased hearing.  Desires no further intervention at this time.       Past Medical History  Diagnosis Date  . Hypertension   . Hypercholesterolemia   . Hyperglycemia   . Hypothyroidism   . Breast cancer 1986    s/p right lumpectomy, XRT and Tamoxifen  . Diverticulosis   . Environmental allergies   . Arthritis   . GERD (gastroesophageal reflux disease)   . Glaucoma   . Colon polyps   . Chicken pox     Outpatient Encounter Prescriptions as of 01/24/2014  Medication Sig  . amLODipine (NORVASC) 5 MG tablet Take 1 tablet (5 mg total) by mouth daily.  Marland Kitchen aspirin 81 MG tablet Take 81 mg by mouth daily.  . bimatoprost (LUMIGAN) 0.01 % SOLN 1 drop at bedtime.  . Calcium Carbonate-Vitamin D (CALCIUM 600+D) 600-400 MG-UNIT per tablet Take 1 tablet by mouth 3 (three) times daily.   . cetirizine (ZYRTEC) 10 MG tablet Take 10 mg by mouth daily.  . Coenzyme Q10 (COQ10) 400 MG CAPS Take by mouth 2 (two) times daily.   . Cyanocobalamin (VITAMIN B-12 IJ) Inject as directed once. monthly   . dorzolamide-timolol (COSOPT) 22.3-6.8 MG/ML ophthalmic solution Place 1 drop into both eyes 2 (two) times daily.  Marland Kitchen gabapentin (NEURONTIN) 100 MG capsule Take one in the am and two q hs.  . levothyroxine (SYNTHROID, LEVOTHROID) 50 MCG tablet Take 1 tablet (50 mcg total) by mouth daily.  Marland Kitchen omeprazole (PRILOSEC) 40 MG capsule Take 1 capsule (40 mg total) by mouth daily.  . simvastatin (ZOCOR) 10 MG tablet Take 1 tablet (10 mg total) by mouth daily.  . temazepam (RESTORIL) 30 MG capsule Take 1 capsule (30 mg total) by mouth at bedtime as needed.  . zoledronic acid (RECLAST) 5 MG/100ML SOLN Inject 5 mg into the vein once.  . [DISCONTINUED] timolol (BETIMOL) 0.25 % ophthalmic solution Place 1 drop into both eyes 2 (two) times daily.    Review of Systems Patient denies any headache, lightheadedness or dizziness.  No significant sinus or allergy problems. No chest pain, tightness or palpitations.  No increased shortness of breath, cough or congestion.  No nausea or vomiting.   No increased acid reflux.  No abdominal pain or cramping.  No bowel change, such as diarrhea, constipation, BRBPR or melana.   Had a recent eye exam.  S/p right and left eye cataract surgery.  Seeing Dr Dorthula Nettles regarding her glaucoma.  Planning for surgery next week.  Pressure is up.  Sugars and blood pressure as outlined.       Objective:   Physical Exam  Filed Vitals:   01/24/14 0746  BP: 130/70  Pulse: 66  Temp: 98.2 F (36.8 C)   Blood pressure recheck:  60/87  80 year old female in no acute distress.   HEENT:  Nares- clear.  Oropharynx - without lesions.  Right ear - clear.  No cerumen impaction.  Left ear - minimal wax.   NECK:  Supple.  Nontender.  No audible bruit.  HEART:  Appears to be regular. LUNGS:  No crackles or wheezing audible.  Respirations even and unlabored.  RADIAL PULSE:  Equal bilaterally.  ABDOMEN:  Soft, nontender.  Bowel sounds present and normal.  No audible abdominal bruit.     EXTREMITIES:  No increased edema present.  DP pulses palpable and equal bilaterally.      FEET:  Without lesions.       Assessment & Plan:  DIFFICULTY SLEEPING.  Takes Temazepam prn.  Follow.  Rarely takes.   SOFT TISSUE FULLNESS (NECK/ANTERIOR CHEST).  Non tender.  Saw Dr Tamala Julian.  Diagnosed with lipoma.  Recommended following.     HEALTH MAINTENANCE.  Physical 09/20/13.    Colonoscopy 08/20/09 - diverticulosis.  Mammogram 11/01/13 - Birads I.

## 2014-01-24 NOTE — Assessment & Plan Note (Signed)
On Simvastatin.  Low cholesterol diet and exercise.  Lipid panel 01/17/14 revealed triglycerides 68 and LDL 105.  Follow.

## 2014-01-24 NOTE — Progress Notes (Signed)
Pre visit review using our clinic review tool, if applicable. No additional management support is needed unless otherwise documented below in the visit note. 

## 2014-01-24 NOTE — Assessment & Plan Note (Signed)
Blood pressure under good control.  Same medication.  Follow metabolic panel.

## 2014-01-24 NOTE — Assessment & Plan Note (Signed)
S/p right lumpectomy 1986 with XRT and Tamoxifen 1986.  S/p mastectomy (right) for reoccurrence 1993.  Mammogram 11/01/13 - Birads I.

## 2014-01-24 NOTE — Assessment & Plan Note (Signed)
Seeing Dr Dorthula Nettles for glaucoma.  Planning for surgery next week.

## 2014-01-24 NOTE — Assessment & Plan Note (Signed)
On Gabapentin.  Stable.  Follow.

## 2014-01-24 NOTE — Assessment & Plan Note (Signed)
Last bone density 11/01/13 - normal.  S/p reclast.  Continue weight bearing exercise and her vitamin D as she is doing.  Follow.

## 2014-01-24 NOTE — Assessment & Plan Note (Signed)
On synthroid.  Follow tsh.   

## 2014-01-24 NOTE — Assessment & Plan Note (Signed)
Sugars as outlined.  Low carb diet.  Exercise.  A1c just checked 01/17/14  - 6.9.  Plans to start walking.  Watching her diet.  Up to date with eye exams.  Seeing Dr Dorthula Nettles for her glaucoma.

## 2014-01-30 ENCOUNTER — Ambulatory Visit: Payer: Self-pay | Admitting: Ophthalmology

## 2014-01-30 DIAGNOSIS — Z853 Personal history of malignant neoplasm of breast: Secondary | ICD-10-CM | POA: Diagnosis not present

## 2014-01-30 DIAGNOSIS — H4011X0 Primary open-angle glaucoma, stage unspecified: Secondary | ICD-10-CM | POA: Diagnosis not present

## 2014-01-30 DIAGNOSIS — H401212 Low-tension glaucoma, right eye, moderate stage: Secondary | ICD-10-CM | POA: Diagnosis not present

## 2014-01-30 DIAGNOSIS — I10 Essential (primary) hypertension: Secondary | ICD-10-CM | POA: Diagnosis not present

## 2014-01-30 DIAGNOSIS — E119 Type 2 diabetes mellitus without complications: Secondary | ICD-10-CM | POA: Diagnosis not present

## 2014-01-30 DIAGNOSIS — H4089 Other specified glaucoma: Secondary | ICD-10-CM | POA: Diagnosis not present

## 2014-02-09 ENCOUNTER — Other Ambulatory Visit: Payer: Self-pay | Admitting: *Deleted

## 2014-02-09 MED ORDER — OMEPRAZOLE 40 MG PO CPDR: 40.0000 mg | DELAYED_RELEASE_CAPSULE | Freq: Every day | ORAL | Status: DC

## 2014-02-21 ENCOUNTER — Ambulatory Visit (INDEPENDENT_AMBULATORY_CARE_PROVIDER_SITE_OTHER): Payer: Medicare Other | Admitting: *Deleted

## 2014-02-21 DIAGNOSIS — E538 Deficiency of other specified B group vitamins: Secondary | ICD-10-CM | POA: Diagnosis not present

## 2014-02-21 MED ORDER — CYANOCOBALAMIN 1000 MCG/ML IJ SOLN
1000.0000 ug | Freq: Once | INTRAMUSCULAR | Status: AC
  Administered 2014-02-21: 1000 ug via INTRAMUSCULAR

## 2014-02-28 ENCOUNTER — Other Ambulatory Visit: Payer: Self-pay | Admitting: *Deleted

## 2014-02-28 MED ORDER — SIMVASTATIN 10 MG PO TABS: 10.0000 mg | ORAL_TABLET | Freq: Every day | ORAL | Status: DC

## 2014-03-29 ENCOUNTER — Ambulatory Visit (INDEPENDENT_AMBULATORY_CARE_PROVIDER_SITE_OTHER): Payer: Medicare Other | Admitting: *Deleted

## 2014-03-29 DIAGNOSIS — E538 Deficiency of other specified B group vitamins: Secondary | ICD-10-CM

## 2014-03-29 MED ORDER — CYANOCOBALAMIN 1000 MCG/ML IJ SOLN
1000.0000 ug | Freq: Once | INTRAMUSCULAR | Status: AC
  Administered 2014-03-29: 1000 ug via INTRAMUSCULAR

## 2014-04-03 ENCOUNTER — Other Ambulatory Visit: Payer: Self-pay | Admitting: Internal Medicine

## 2014-04-24 ENCOUNTER — Other Ambulatory Visit: Payer: Self-pay | Admitting: Internal Medicine

## 2014-05-02 ENCOUNTER — Ambulatory Visit (INDEPENDENT_AMBULATORY_CARE_PROVIDER_SITE_OTHER): Payer: Medicare Other | Admitting: *Deleted

## 2014-05-02 DIAGNOSIS — E538 Deficiency of other specified B group vitamins: Secondary | ICD-10-CM

## 2014-05-02 MED ORDER — CYANOCOBALAMIN 1000 MCG/ML IJ SOLN
1000.0000 ug | Freq: Once | INTRAMUSCULAR | Status: AC
  Administered 2014-05-02: 1000 ug via INTRAMUSCULAR

## 2014-05-24 ENCOUNTER — Other Ambulatory Visit (INDEPENDENT_AMBULATORY_CARE_PROVIDER_SITE_OTHER): Payer: Medicare Other

## 2014-05-24 ENCOUNTER — Encounter: Payer: Self-pay | Admitting: Internal Medicine

## 2014-05-24 DIAGNOSIS — E119 Type 2 diabetes mellitus without complications: Secondary | ICD-10-CM

## 2014-05-24 DIAGNOSIS — E7800 Pure hypercholesterolemia, unspecified: Secondary | ICD-10-CM

## 2014-05-24 DIAGNOSIS — C50919 Malignant neoplasm of unspecified site of unspecified female breast: Secondary | ICD-10-CM

## 2014-05-24 LAB — CBC WITH DIFFERENTIAL/PLATELET
Basophils Absolute: 0.1 K/uL (ref 0.0–0.1)
Basophils Relative: 0.7 % (ref 0.0–3.0)
Eosinophils Absolute: 0.1 K/uL (ref 0.0–0.7)
Eosinophils Relative: 1.4 % (ref 0.0–5.0)
HCT: 41.4 % (ref 36.0–46.0)
Hemoglobin: 13.8 g/dL (ref 12.0–15.0)
Lymphocytes Relative: 24.4 % (ref 12.0–46.0)
Lymphs Abs: 1.8 K/uL (ref 0.7–4.0)
MCHC: 33.3 g/dL (ref 30.0–36.0)
MCV: 89.1 fl (ref 78.0–100.0)
Monocytes Absolute: 0.5 K/uL (ref 0.1–1.0)
Monocytes Relative: 7 % (ref 3.0–12.0)
Neutro Abs: 4.9 K/uL (ref 1.4–7.7)
Neutrophils Relative %: 66.5 % (ref 43.0–77.0)
Platelets: 257 K/uL (ref 150.0–400.0)
RBC: 4.65 Mil/uL (ref 3.87–5.11)
RDW: 14.9 % (ref 11.5–15.5)
WBC: 7.4 K/uL (ref 4.0–10.5)

## 2014-05-24 LAB — BASIC METABOLIC PANEL
BUN: 17 mg/dL (ref 6–23)
CALCIUM: 9.5 mg/dL (ref 8.4–10.5)
CO2: 31 meq/L (ref 19–32)
Chloride: 106 mEq/L (ref 96–112)
Creatinine, Ser: 0.96 mg/dL (ref 0.40–1.20)
GFR: 59.05 mL/min — ABNORMAL LOW (ref >60.00)
GLUCOSE: 110 mg/dL — AB (ref 70–99)
Potassium: 4.5 mEq/L (ref 3.5–5.1)
Sodium: 140 mEq/L (ref 135–145)

## 2014-05-24 LAB — LIPID PANEL
CHOL/HDL RATIO: 3
CHOLESTEROL: 168 mg/dL (ref 0–200)
HDL: 64 mg/dL (ref >39.00)
LDL CALC: 89 mg/dL (ref 0–99)
NONHDL: 104
TRIGLYCERIDES: 74 mg/dL (ref 0.0–149.0)
VLDL: 14.8 mg/dL (ref 0.0–40.0)

## 2014-05-24 LAB — HEPATIC FUNCTION PANEL
ALK PHOS: 47 U/L (ref 39–117)
ALT: 11 U/L (ref 0–35)
AST: 13 U/L (ref 0–37)
Albumin: 4.3 g/dL (ref 3.5–5.2)
Bilirubin, Direct: 0.2 mg/dL (ref 0.0–0.3)
Total Bilirubin: 0.6 mg/dL (ref 0.2–1.2)
Total Protein: 7.3 g/dL (ref 6.0–8.3)

## 2014-05-24 LAB — HEMOGLOBIN A1C: Hgb A1c MFr Bld: 7 % — ABNORMAL HIGH (ref 4.6–6.5)

## 2014-05-25 ENCOUNTER — Other Ambulatory Visit: Payer: Medicare Other

## 2014-05-29 ENCOUNTER — Encounter: Payer: Self-pay | Admitting: Internal Medicine

## 2014-05-29 ENCOUNTER — Ambulatory Visit (INDEPENDENT_AMBULATORY_CARE_PROVIDER_SITE_OTHER): Payer: Medicare Other | Admitting: Internal Medicine

## 2014-05-29 VITALS — BP 115/68 | HR 65 | Temp 98.0°F | Ht 65.0 in | Wt 163.8 lb

## 2014-05-29 DIAGNOSIS — M79674 Pain in right toe(s): Secondary | ICD-10-CM | POA: Diagnosis not present

## 2014-05-29 DIAGNOSIS — C50919 Malignant neoplasm of unspecified site of unspecified female breast: Secondary | ICD-10-CM

## 2014-05-29 DIAGNOSIS — H409 Unspecified glaucoma: Secondary | ICD-10-CM

## 2014-05-29 DIAGNOSIS — N359 Urethral stricture, unspecified: Secondary | ICD-10-CM

## 2014-05-29 DIAGNOSIS — E7800 Pure hypercholesterolemia, unspecified: Secondary | ICD-10-CM

## 2014-05-29 DIAGNOSIS — E039 Hypothyroidism, unspecified: Secondary | ICD-10-CM

## 2014-05-29 DIAGNOSIS — G629 Polyneuropathy, unspecified: Secondary | ICD-10-CM

## 2014-05-29 DIAGNOSIS — M79676 Pain in unspecified toe(s): Secondary | ICD-10-CM | POA: Insufficient documentation

## 2014-05-29 DIAGNOSIS — M8580 Other specified disorders of bone density and structure, unspecified site: Secondary | ICD-10-CM | POA: Diagnosis not present

## 2014-05-29 DIAGNOSIS — I10 Essential (primary) hypertension: Secondary | ICD-10-CM

## 2014-05-29 DIAGNOSIS — E119 Type 2 diabetes mellitus without complications: Secondary | ICD-10-CM | POA: Diagnosis not present

## 2014-05-29 NOTE — Assessment & Plan Note (Signed)
Has required dilatation previously.  Now with some reoccurring symptoms.  Mild.  Desires no further w/up at this point.  Will notify me when agreeable.

## 2014-05-29 NOTE — Patient Instructions (Signed)
Julie Hoover

## 2014-05-29 NOTE — Assessment & Plan Note (Signed)
Seeing ophthalmology.

## 2014-05-29 NOTE — Progress Notes (Signed)
Patient ID: Julie Hoover, female   DOB: [birth year], 81 y.o.   MRN: [id number]   Subjective:    Patient ID: Julie Hoover, female    DOB: 17-Feb-1930, 81 y.o.   MRN: [id number]  HPI  Patient here for a scheduled follow up.  Has a history of diabetes, hypertension and hypercholesterolemia.  She injured her toe 3-4 weeks ago.  Kicked a stool.  Has been sore since.  Some minimal swelling.  Is some better.  Has kept the two toes taped.  She had a shunt placed in her left eye.  Pressure better.  Continues to f/u with ophthalmology.  She has had issues with urethral stricture in the past.  Has started having some reoccurring symptoms.  Desires no referral at this time.  Sugars attached.  AM sugars averaging 90-110 and pm sugars averaging 120-150.  Overall she feels she is dong well.     Past Medical History  Diagnosis Date  . Hypertension   . Hypercholesterolemia   . Hyperglycemia   . Hypothyroidism   . Breast cancer 1986    s/p right lumpectomy, XRT and Tamoxifen  . Diverticulosis   . Environmental allergies   . Arthritis   . GERD (gastroesophageal reflux disease)   . Glaucoma   . Colon polyps   . Chicken pox     Current Outpatient Prescriptions on File Prior to Visit  Medication Sig Dispense Refill  . amLODipine (NORVASC) 5 MG tablet TAKE 1 BY MOUTH DAILY 90 tablet 0  . aspirin 81 MG tablet Take 81 mg by mouth daily.    . bimatoprost (LUMIGAN) 0.01 % SOLN Place 1 drop into the left eye at bedtime.     . Calcium Carbonate-Vitamin D (CALCIUM 600+D) 600-400 MG-UNIT per tablet Take 1 tablet by mouth 3 (three) times daily.     . cetirizine (ZYRTEC) 10 MG tablet Take 10 mg by mouth daily.    . Coenzyme Q10 (COQ10) 400 MG CAPS Take by mouth 2 (two) times daily.     . Cyanocobalamin (VITAMIN B-12 IJ) Inject as directed once. monthly    . dorzolamide-timolol (COSOPT) 22.3-6.8 MG/ML ophthalmic solution Place 2 drops into the left eye 2 (two) times daily.     Marland Kitchen gabapentin  (NEURONTIN) 100 MG capsule TAKE 1 BY MOUTH EVERY MORNING AND TAKE 2 BY MOUTH AT BEDTIME 270 capsule 0  . levothyroxine (SYNTHROID, LEVOTHROID) 50 MCG tablet Take 1 tablet (50 mcg total) by mouth daily. 90 tablet 3  . omeprazole (PRILOSEC) 40 MG capsule Take 1 capsule (40 mg total) by mouth daily. 90 capsule 1  . simvastatin (ZOCOR) 10 MG tablet Take 1 tablet (10 mg total) by mouth daily. 90 tablet 2  . temazepam (RESTORIL) 30 MG capsule Take 1 capsule (30 mg total) by mouth at bedtime as needed. 30 capsule 0  . zoledronic acid (RECLAST) 5 MG/100ML SOLN Inject 5 mg into the vein once.     No current facility-administered medications on file prior to visit.    Review of Systems  Constitutional: Negative for appetite change, fatigue and unexpected weight change.  HENT: Negative for congestion and sinus pressure.   Respiratory: Negative for cough, chest tightness and shortness of breath.   Cardiovascular: Negative for chest pain, palpitations and leg swelling.  Gastrointestinal: Negative for nausea, vomiting, abdominal pain, diarrhea and constipation.  Genitourinary: Negative for frequency and hematuria.       Has had issues with a urethral stricture previously.  Some reoccurring symptoms.  Not severe.    Musculoskeletal: Negative for back pain and joint swelling.  Skin: Negative for color change and rash.  Neurological: Negative for dizziness, light-headedness and headaches.  Hematological: Negative for adenopathy. Does not bruise/bleed easily.       Objective:    Physical Exam  Constitutional: She appears well-developed and well-nourished. No distress.  HENT:  Nose: Nose normal.  Mouth/Throat: Oropharynx is clear and moist.  Neck: Neck supple. No thyromegaly present.  Cardiovascular: Normal rate and regular rhythm.   Pulmonary/Chest: Breath sounds normal. No respiratory distress. She has no wheezes.  Abdominal: Soft. Bowel sounds are normal. There is no tenderness.  Musculoskeletal:  She exhibits no edema or tenderness.  Lymphadenopathy:    She has no cervical adenopathy.  Skin: No rash noted. No erythema.    BP 115/68 mmHg  Pulse 65  Temp(Src) 98 F (36.7 C) (Oral)  Ht 5\' 5"  (1.651 m)  Wt 163 lb 12 oz (74.277 kg)  BMI 27.25 kg/m2  SpO2 95% Wt Readings from Last 3 Encounters:  05/29/14 163 lb 12 oz (74.277 kg)  01/24/14 165 lb 8 oz (75.07 kg)  09/20/13 163 lb 8 oz (74.163 kg)     Lab Results  Component Value Date   WBC 7.4 05/24/2014   HGB 13.8 05/24/2014   HCT 41.4 05/24/2014   PLT 257.0 05/24/2014   GLUCOSE 110* 05/24/2014   CHOL 168 05/24/2014   TRIG 74.0 05/24/2014   HDL 64.00 05/24/2014   LDLCALC 89 05/24/2014   ALT 11 05/24/2014   AST 13 05/24/2014   NA 140 05/24/2014   K 4.5 05/24/2014   CL 106 05/24/2014   CREATININE 0.96 05/24/2014   BUN 17 05/24/2014   CO2 31 05/24/2014   TSH 3.28 09/19/2013   HGBA1C 7.0* 05/24/2014   MICROALBUR 0.2 09/19/2013       Assessment & Plan:   Problem List Items Addressed This Visit    Breast cancer    S/p right lumpectomy 1986 .  Is s/p XRT and tamoxifen 1986.  S/p right mastectomy for reoccurrence in 1993.  Mammogram 11/01/13 - [facility] I.       Diabetes    Sugars attached.  Discussed low carb diet.  Follow.        Relevant Orders   Hemoglobin A1c   Glaucoma    Seeing ophthalmology.        Relevant Medications   DUREZOL 0.05 % EMUL   Hypercholesterolemia    Low cholesterol diet and exercise.  On simvastatin.  Follow lipid panel and liver function tests.        Relevant Orders   Lipid panel   Hepatic function panel   Hypertension - Primary    Blood pressure doing well.  Same medication regimen.  Follow pressures.        Relevant Orders   Basic metabolic panel   Hypothyroidism    On thyroid replacement.  Follow tsh.        Relevant Orders   TSH   Osteopenia    F/u bone density 11/01/13 - normal.  S/p reclast.  Continue weight bearing exercise and her vitamin D. Check vitamin D  level.        Relevant Orders   Vit D  25 hydroxy (rtn osteoporosis monitoring)   Peripheral neuropathy    On gabapentin.  Stable.        Toe pain    Persistent pain s/p injury.  Is better.  She desires no further w/up  or evaluation.  Follow.        Urethral stricture    Has required dilatation previously.  Now with some reoccurring symptoms.  Mild.  Desires no further w/up at this point.  Will notify me when agreeable.          HEALTH MAINTENANCE.  Physical 09/20/13.  Colonoscopy 08/20/09.  Mammogram 11/01/13 - [facility] I  Einar Pheasant, MD

## 2014-05-29 NOTE — Assessment & Plan Note (Signed)
Blood pressure doing well.  Same medication regimen.  Follow pressures.   

## 2014-05-29 NOTE — Assessment & Plan Note (Signed)
On gabapentin.  Stable.  

## 2014-05-29 NOTE — Assessment & Plan Note (Signed)
Persistent pain s/p injury.  Is better.  She desires no further w/up or evaluation.  Follow.

## 2014-05-29 NOTE — Assessment & Plan Note (Signed)
On thyroid replacement.  Follow tsh.  

## 2014-05-29 NOTE — Assessment & Plan Note (Signed)
Sugars attached.  Discussed low carb diet.  Follow.

## 2014-05-29 NOTE — Assessment & Plan Note (Signed)
S/p right lumpectomy 1986 .  Is s/p XRT and tamoxifen 1986.  S/p right mastectomy for reoccurrence in 1993.  Mammogram 11/01/13 - [facility] I.

## 2014-05-29 NOTE — Assessment & Plan Note (Signed)
Low cholesterol diet and exercise.  On simvastatin.  Follow lipid panel and liver function tests.   

## 2014-05-29 NOTE — Progress Notes (Signed)
Pre visit review using our clinic review tool, if applicable. No additional management support is needed unless otherwise documented below in the visit note. 

## 2014-05-29 NOTE — Assessment & Plan Note (Signed)
F/u bone density 11/01/13 - normal.  S/p reclast.  Continue weight bearing exercise and her vitamin D. Check vitamin D level.

## 2014-06-06 ENCOUNTER — Ambulatory Visit: Payer: Medicare Other

## 2014-06-06 DIAGNOSIS — H401233 Low-tension glaucoma, bilateral, severe stage: Secondary | ICD-10-CM | POA: Diagnosis not present

## 2014-06-09 ENCOUNTER — Other Ambulatory Visit: Payer: Self-pay | Admitting: Internal Medicine

## 2014-06-14 ENCOUNTER — Ambulatory Visit: Payer: Medicare Other

## 2014-06-15 ENCOUNTER — Ambulatory Visit (INDEPENDENT_AMBULATORY_CARE_PROVIDER_SITE_OTHER): Payer: Medicare Other

## 2014-06-15 DIAGNOSIS — E538 Deficiency of other specified B group vitamins: Secondary | ICD-10-CM | POA: Diagnosis not present

## 2014-06-15 MED ORDER — CYANOCOBALAMIN 1000 MCG/ML IJ SOLN
1000.0000 ug | Freq: Once | INTRAMUSCULAR | Status: AC
  Administered 2014-06-15: 1000 ug via INTRAMUSCULAR

## 2014-06-20 ENCOUNTER — Ambulatory Visit: Payer: Medicare Other

## 2014-06-30 DIAGNOSIS — H401233 Low-tension glaucoma, bilateral, severe stage: Secondary | ICD-10-CM | POA: Diagnosis not present

## 2014-07-07 ENCOUNTER — Telehealth: Payer: Self-pay | Admitting: *Deleted

## 2014-07-07 MED ORDER — ONETOUCH VERIO IQ SYSTEM W/DEVICE KIT: PACK | Status: DC

## 2014-07-07 MED ORDER — GLUCOSE BLOOD VI STRP: ORAL_STRIP | Status: DC

## 2014-07-07 NOTE — Telephone Encounter (Signed)
Phone call from pt, diabetic supply company will no longer send her supplies. Insurance told her she can get a free [employer]. Needs Rx sent to [facility]. Rx sent to pharmacy by escript

## 2014-07-13 ENCOUNTER — Telehealth: Payer: Self-pay | Admitting: *Deleted

## 2014-07-13 DIAGNOSIS — M8580 Other specified disorders of bone density and structure, unspecified site: Secondary | ICD-10-CM

## 2014-07-13 NOTE — Telephone Encounter (Signed)
Juliann Pulse with Dr. Precious Reel office called to inform Dr. Nicki Reaper that she called patient to get her scheduled for her Reclast & the patient stated that she gets Reclast every other year. She also wanted to let you know that her last bone density was in 2012. If we would like to to schedule her bone density please contact patient at: [phone].

## 2014-07-15 NOTE — Telephone Encounter (Signed)
If pt is agreeable, I will schedule the bone density.

## 2014-07-17 NOTE — Telephone Encounter (Signed)
LMTCB

## 2014-07-17 NOTE — Telephone Encounter (Signed)
Pt states that she is agreeable to proceed with bone density test

## 2014-07-17 NOTE — Telephone Encounter (Signed)
Bone density ordered.

## 2014-07-18 ENCOUNTER — Ambulatory Visit (INDEPENDENT_AMBULATORY_CARE_PROVIDER_SITE_OTHER): Payer: Medicare Other | Admitting: *Deleted

## 2014-07-18 DIAGNOSIS — E538 Deficiency of other specified B group vitamins: Secondary | ICD-10-CM

## 2014-07-18 DIAGNOSIS — G629 Polyneuropathy, unspecified: Secondary | ICD-10-CM

## 2014-07-18 MED ORDER — CYANOCOBALAMIN 1000 MCG/ML IJ SOLN
1000.0000 ug | Freq: Once | INTRAMUSCULAR | Status: AC
  Administered 2014-07-18: 1000 ug via INTRAMUSCULAR

## 2014-07-31 ENCOUNTER — Other Ambulatory Visit: Payer: Self-pay | Admitting: Internal Medicine

## 2014-07-31 DIAGNOSIS — M8580 Other specified disorders of bone density and structure, unspecified site: Secondary | ICD-10-CM | POA: Diagnosis not present

## 2014-07-31 LAB — HM DEXA SCAN

## 2014-08-01 ENCOUNTER — Encounter: Payer: Self-pay | Admitting: *Deleted

## 2014-08-01 ENCOUNTER — Telehealth: Payer: Self-pay | Admitting: Internal Medicine

## 2014-08-01 NOTE — Telephone Encounter (Signed)
Pt notified bone density improved via my chart.  (osteopenia).

## 2014-08-08 DIAGNOSIS — H401233 Low-tension glaucoma, bilateral, severe stage: Secondary | ICD-10-CM | POA: Diagnosis not present

## 2014-08-22 ENCOUNTER — Ambulatory Visit (INDEPENDENT_AMBULATORY_CARE_PROVIDER_SITE_OTHER): Payer: Medicare Other | Admitting: *Deleted

## 2014-08-22 DIAGNOSIS — E538 Deficiency of other specified B group vitamins: Secondary | ICD-10-CM | POA: Diagnosis not present

## 2014-08-22 MED ORDER — CYANOCOBALAMIN 1000 MCG/ML IJ SOLN
1000.0000 ug | Freq: Once | INTRAMUSCULAR | Status: AC
  Administered 2014-08-22: 1000 ug via INTRAMUSCULAR

## 2014-09-25 ENCOUNTER — Other Ambulatory Visit (INDEPENDENT_AMBULATORY_CARE_PROVIDER_SITE_OTHER): Payer: Medicare Other

## 2014-09-25 DIAGNOSIS — E119 Type 2 diabetes mellitus without complications: Secondary | ICD-10-CM

## 2014-09-25 DIAGNOSIS — M859 Disorder of bone density and structure, unspecified: Secondary | ICD-10-CM | POA: Diagnosis not present

## 2014-09-25 DIAGNOSIS — I10 Essential (primary) hypertension: Secondary | ICD-10-CM | POA: Diagnosis not present

## 2014-09-25 DIAGNOSIS — E7800 Pure hypercholesterolemia, unspecified: Secondary | ICD-10-CM

## 2014-09-25 DIAGNOSIS — E039 Hypothyroidism, unspecified: Secondary | ICD-10-CM

## 2014-09-25 DIAGNOSIS — M8580 Other specified disorders of bone density and structure, unspecified site: Secondary | ICD-10-CM

## 2014-09-25 LAB — HEPATIC FUNCTION PANEL
ALK PHOS: 52 U/L (ref 39–117)
ALT: 11 U/L (ref 0–35)
AST: 14 U/L (ref 0–37)
Albumin: 4.4 g/dL (ref 3.5–5.2)
BILIRUBIN TOTAL: 0.6 mg/dL (ref 0.2–1.2)
Bilirubin, Direct: 0.1 mg/dL (ref 0.0–0.3)
TOTAL PROTEIN: 7.6 g/dL (ref 6.0–8.3)

## 2014-09-25 LAB — BASIC METABOLIC PANEL
BUN: 17 mg/dL (ref 6–23)
CALCIUM: 9.7 mg/dL (ref 8.4–10.5)
CHLORIDE: 104 meq/L (ref 96–112)
CO2: 29 mEq/L (ref 19–32)
Creatinine, Ser: 1.03 mg/dL (ref 0.40–1.20)
GFR: 54.4 mL/min — ABNORMAL LOW (ref >60.00)
Glucose, Bld: 111 mg/dL — ABNORMAL HIGH (ref 70–99)
Potassium: 4.8 mEq/L (ref 3.5–5.1)
SODIUM: 140 meq/L (ref 135–145)

## 2014-09-25 LAB — LIPID PANEL
Cholesterol: 183 mg/dL (ref 0–200)
HDL: 64.9 mg/dL (ref >39.00)
LDL CALC: 104 mg/dL — AB (ref 0–99)
NonHDL: 118.1
Total CHOL/HDL Ratio: 3
Triglycerides: 69 mg/dL (ref 0.0–149.0)
VLDL: 13.8 mg/dL (ref 0.0–40.0)

## 2014-09-25 LAB — TSH: TSH: 2.46 u[IU]/mL (ref 0.35–4.50)

## 2014-09-25 LAB — HEMOGLOBIN A1C: HEMOGLOBIN A1C: 6.6 % — AB (ref 4.6–6.5)

## 2014-09-25 LAB — VITAMIN D 25 HYDROXY (VIT D DEFICIENCY, FRACTURES): VITD: 36.12 ng/mL (ref 30.00–100.00)

## 2014-09-27 ENCOUNTER — Ambulatory Visit (INDEPENDENT_AMBULATORY_CARE_PROVIDER_SITE_OTHER): Payer: Medicare Other | Admitting: Internal Medicine

## 2014-09-27 ENCOUNTER — Encounter: Payer: Self-pay | Admitting: Internal Medicine

## 2014-09-27 VITALS — BP 103/62 | HR 62 | Temp 97.8°F | Ht 65.0 in | Wt 160.0 lb

## 2014-09-27 DIAGNOSIS — E7800 Pure hypercholesterolemia, unspecified: Secondary | ICD-10-CM

## 2014-09-27 DIAGNOSIS — E538 Deficiency of other specified B group vitamins: Secondary | ICD-10-CM | POA: Diagnosis not present

## 2014-09-27 DIAGNOSIS — H409 Unspecified glaucoma: Secondary | ICD-10-CM

## 2014-09-27 DIAGNOSIS — I10 Essential (primary) hypertension: Secondary | ICD-10-CM

## 2014-09-27 DIAGNOSIS — C50911 Malignant neoplasm of unspecified site of right female breast: Secondary | ICD-10-CM

## 2014-09-27 DIAGNOSIS — E039 Hypothyroidism, unspecified: Secondary | ICD-10-CM

## 2014-09-27 DIAGNOSIS — G629 Polyneuropathy, unspecified: Secondary | ICD-10-CM

## 2014-09-27 DIAGNOSIS — E119 Type 2 diabetes mellitus without complications: Secondary | ICD-10-CM

## 2014-09-27 DIAGNOSIS — Z0000 Encounter for general adult medical examination without abnormal findings: Secondary | ICD-10-CM | POA: Diagnosis not present

## 2014-09-27 DIAGNOSIS — M8580 Other specified disorders of bone density and structure, unspecified site: Secondary | ICD-10-CM

## 2014-09-27 MED ORDER — CYANOCOBALAMIN 1000 MCG/ML IJ SOLN
1000.0000 ug | Freq: Once | INTRAMUSCULAR | Status: AC
  Administered 2014-09-27: 1000 ug via INTRAMUSCULAR

## 2014-09-27 NOTE — Assessment & Plan Note (Signed)
Physical today 09/27/14.  Colonoscopy 08/20/09.  Mammogram 11/01/13 - [facility] I.  Schedule f/u mammogram.  Bone density 07/31/14 - improved.

## 2014-09-27 NOTE — Progress Notes (Signed)
Patient ID: Julie Hoover, female   DOB: [birth year], 81 y.o.   MRN: [id number]   Subjective:    Patient ID: Julie Hoover, female    DOB: [birth year], 81 y.o.   MRN: [id number]  HPI  Patient here to follow up on her current medical issues and for a physical exam.  Tries to stay active.  No cardiac symptoms with increased activity or exertion.  Sugars better.  a1c 6.6.  States am sugars averaging 90-100 and pm sugars never above 150  Breathing stable.  Bowels stable.  Some burning in her feet.  Takes gabapentin.  Has adjusted time taking.  Takes one dose in afternoon and two capsules at night.  This has helped.  Will notify me if feels needs more.    Past Medical History  Diagnosis Date  . Hypertension   . Hypercholesterolemia   . Hyperglycemia   . Hypothyroidism   . Breast cancer 1986    s/p right lumpectomy, XRT and Tamoxifen  . Diverticulosis   . Environmental allergies   . Arthritis   . GERD (gastroesophageal reflux disease)   . Glaucoma   . Colon polyps   . Chicken pox     Current Outpatient Prescriptions on File Prior to Visit  Medication Sig Dispense Refill  . amLODipine (NORVASC) 5 MG tablet TAKE 1 BY MOUTH DAILY 90 tablet 0  . aspirin 81 MG tablet Take 81 mg by mouth daily.    . Blood Glucose Monitoring Suppl (ONETOUCH VERIO IQ SYSTEM) W/DEVICE KIT Use as directed Dx: E11.9 1 kit 0  . Calcium Carbonate-Vitamin D (CALCIUM 600+D) 600-400 MG-UNIT per tablet Take 1 tablet by mouth 3 (three) times daily.     . cetirizine (ZYRTEC) 10 MG tablet Take 10 mg by mouth daily.    . Coenzyme Q10 (COQ10) 400 MG CAPS Take by mouth 2 (two) times daily.     . Cyanocobalamin (VITAMIN B-12 IJ) Inject as directed once. monthly    . dorzolamide-timolol (COSOPT) 22.3-6.8 MG/ML ophthalmic solution Place 2 drops into the left eye 2 (two) times daily.     . DUREZOL 0.05 % EMUL Place 1 drop into the right eye daily.  0  . gabapentin (NEURONTIN) 100 MG capsule TAKE 1 BY MOUTH EVERY  MORNING AND 2 BY MOUTH AT BEDTIME 270 capsule 0  . glucose blood (ONETOUCH VERIO) test strip Check sugar daily Dx: E11.9 100 each 5  . levothyroxine (SYNTHROID, LEVOTHROID) 50 MCG tablet Take 1 tablet (50 mcg total) by mouth daily. 90 tablet 3  . omeprazole (PRILOSEC) 40 MG capsule TAKE 1 BY MOUTH DAILY 90 capsule 0  . simvastatin (ZOCOR) 10 MG tablet Take 1 tablet (10 mg total) by mouth daily. 90 tablet 2  . temazepam (RESTORIL) 30 MG capsule Take 1 capsule (30 mg total) by mouth at bedtime as needed. 30 capsule 0  . zoledronic acid (RECLAST) 5 MG/100ML SOLN Inject 5 mg into the vein once.     No current facility-administered medications on file prior to visit.    Review of Systems  Constitutional: Negative for activity change and unexpected weight change (has adjusted her diet and trying to watch what she is eating. ).  HENT: Negative for congestion and sinus pressure.   Eyes: Negative for pain.       Sees Dr Dorthula Nettles for her eyes.  Has glaucoma.  Pressures ok now.   Respiratory: Negative for cough, chest tightness and shortness of breath.   Cardiovascular: Negative for chest  pain, palpitations and leg swelling.  Gastrointestinal: Negative for nausea, vomiting, abdominal pain and diarrhea.  Genitourinary: Negative for dysuria and difficulty urinating.  Musculoskeletal: Negative for back pain and joint swelling.  Skin: Negative for color change and rash.  Neurological: Negative for dizziness, light-headedness and headaches.  Hematological: Negative for adenopathy. Does not bruise/bleed easily.  Psychiatric/Behavioral: Negative for dysphoric mood and agitation.       Objective:     Blood pressure recheck:  132/78  Physical Exam  Constitutional: She is oriented to person, place, and time. She appears well-developed and well-nourished.  HENT:  Nose: Nose normal.  Mouth/Throat: Oropharynx is clear and moist.  Eyes: Right eye exhibits no discharge. Left eye exhibits no discharge. No  scleral icterus.  Neck: Neck supple. No thyromegaly present.  Cardiovascular: Normal rate and regular rhythm.   Pulmonary/Chest: Breath sounds normal. No accessory muscle usage. No tachypnea. No respiratory distress. She has no decreased breath sounds. She has no wheezes. She has no rhonchi. Right breast exhibits no inverted nipple, no mass, no nipple discharge and no tenderness (no axillary adenopathy). Left breast exhibits no inverted nipple, no mass, no nipple discharge and no tenderness (no axilarry adenopathy).  Abdominal: Soft. Bowel sounds are normal. There is no tenderness.  Musculoskeletal: She exhibits no edema or tenderness.  Lymphadenopathy:    She has no cervical adenopathy.  Neurological: She is alert and oriented to person, place, and time.  Skin: Skin is warm. No rash noted.  Psychiatric: She has a normal mood and affect. Her behavior is normal.    BP 103/62 mmHg  Pulse 62  Temp(Src) 97.8 F (36.6 C) (Oral)  Ht 5' 5" (1.651 m)  Wt 160 lb (72.576 kg)  BMI 26.63 kg/m2  SpO2 94% Wt Readings from Last 3 Encounters:  09/27/14 160 lb (72.576 kg)  05/29/14 163 lb 12 oz (74.277 kg)  01/24/14 165 lb 8 oz (75.07 kg)     Lab Results  Component Value Date   WBC 7.4 05/24/2014   HGB 13.8 05/24/2014   HCT 41.4 05/24/2014   PLT 257.0 05/24/2014   GLUCOSE 111* 09/25/2014   CHOL 183 09/25/2014   TRIG 69.0 09/25/2014   HDL 64.90 09/25/2014   LDLCALC 104* 09/25/2014   ALT 11 09/25/2014   AST 14 09/25/2014   NA 140 09/25/2014   K 4.8 09/25/2014   CL 104 09/25/2014   CREATININE 1.03 09/25/2014   BUN 17 09/25/2014   CO2 29 09/25/2014   TSH 2.46 09/25/2014   HGBA1C 6.6* 09/25/2014   MICROALBUR 0.2 09/19/2013       Assessment & Plan:   Problem List Items Addressed This Visit    Breast cancer - Primary    S/p right lumpectomy 1986.  Is s/p XRT and tamoxifen 1986.  S/p right mastectomy for reoccurrence in 1993.  Mammogram 11/01/13 - [facility] I.  Schedule diagnostic  mammogram of left breast.        Relevant Orders   MM Digital Diagnostic Unilat L   Diabetes    Sugars doing well as outlined.  A1c just checked - 6.6.  Improved.  Continue diet and exercise.        Relevant Orders   Hemoglobin A1c   Microalbumin / creatinine urine ratio   Glaucoma    Sees opthalmology.       Relevant Medications   latanoprost (XALATAN) 0.005 % ophthalmic solution   prednisoLONE acetate (PRED FORTE) 1 % ophthalmic suspension   Health care maintenance  Physical today 09/27/14.  Colonoscopy 08/20/09.  Mammogram 11/01/13 - [facility] I.  Schedule f/u mammogram.  Bone density 07/31/14 - improved.        Hypercholesterolemia    Low cholesterol diet and exercise.  Follow lipid panel and liver function tests.  On simvastatin.        Relevant Orders   Lipid panel   Hepatic function panel   Hypertension    Blood pressure under good control.  Same medication regimen.  Follow pressures.  Follow metabolic panel.        Relevant Orders   Basic metabolic panel   Hypothyroidism    On thyroid replacement.  Follow tsh.        Osteopenia    Recent bone density improved from the 06/2010 test.  Continue calcium and vitamin D supplements.  Vitamin D level just checked and wnl (36).        Peripheral neuropathy    Has adjusted when she takes her gabapentin.  Better.  Follow.  Will notify me if feels needs to increase the dose.         Other Visit Diagnoses    B12 deficiency        Relevant Medications    cyanocobalamin ((VITAMIN B-12)) injection 1,000 mcg (Completed)      I spent 25 minutes with the patient and more than 50% of the time was spent in consultation regarding the above.     Einar Pheasant, MD

## 2014-09-27 NOTE — Progress Notes (Signed)
Pre visit review using our clinic review tool, if applicable. No additional management support is needed unless otherwise documented below in the visit note. 

## 2014-10-01 ENCOUNTER — Encounter: Payer: Self-pay | Admitting: Internal Medicine

## 2014-10-01 NOTE — Assessment & Plan Note (Signed)
Sees opthalmology.

## 2014-10-01 NOTE — Assessment & Plan Note (Signed)
Blood pressure under good control.  Same medication regimen.  Follow pressures.  Follow metabolic panel.  

## 2014-10-01 NOTE — Assessment & Plan Note (Signed)
Sugars doing well as outlined.  A1c just checked - 6.6.  Improved.  Continue diet and exercise.

## 2014-10-01 NOTE — Assessment & Plan Note (Signed)
On thyroid replacement.  Follow tsh.  

## 2014-10-01 NOTE — Assessment & Plan Note (Addendum)
Recent bone density improved from the 06/2010 test.  Continue calcium and vitamin D supplements.  Vitamin D level just checked and wnl (36).

## 2014-10-01 NOTE — Assessment & Plan Note (Signed)
Low cholesterol diet and exercise.  Follow lipid panel and liver function tests.  On simvastatin.   

## 2014-10-01 NOTE — Assessment & Plan Note (Signed)
S/p right lumpectomy 1986.  Is s/p XRT and tamoxifen 1986.  S/p right mastectomy for reoccurrence in 1993.  Mammogram 11/01/13 - [facility] I.  Schedule diagnostic mammogram of left breast.

## 2014-10-01 NOTE — Assessment & Plan Note (Signed)
Has adjusted when she takes her gabapentin.  Better.  Follow.  Will notify me if feels needs to increase the dose.

## 2014-10-03 ENCOUNTER — Other Ambulatory Visit: Payer: Self-pay | Admitting: *Deleted

## 2014-10-03 MED ORDER — LANCETS 30G MISC
Status: DC
Start: 2014-10-03 — End: 2018-02-15

## 2014-10-09 DIAGNOSIS — H401233 Low-tension glaucoma, bilateral, severe stage: Secondary | ICD-10-CM | POA: Diagnosis not present

## 2014-10-16 ENCOUNTER — Other Ambulatory Visit: Payer: Self-pay

## 2014-10-24 ENCOUNTER — Encounter: Payer: Self-pay | Admitting: Internal Medicine

## 2014-10-24 ENCOUNTER — Other Ambulatory Visit: Payer: Self-pay | Admitting: *Deleted

## 2014-10-24 MED ORDER — TEMAZEPAM 30 MG PO CAPS: 30.0000 mg | ORAL_CAPSULE | Freq: Every evening | ORAL | Status: DC | PRN

## 2014-10-24 NOTE — Telephone Encounter (Signed)
I have refilled the temazepam.  rx signed.  I will forward the information regarding the mammogram to our referral coordinator.  The order is in the system.  Thanks.

## 2014-10-24 NOTE — Telephone Encounter (Signed)
Faxed to pharmacy

## 2014-10-25 ENCOUNTER — Telehealth: Payer: Self-pay | Admitting: *Deleted

## 2014-10-25 NOTE — Telephone Encounter (Signed)
Fax from pharmacy, needing PA for Temazepam. Started online, sent mychart to pt for more information.

## 2014-10-27 ENCOUNTER — Telehealth: Payer: Self-pay | Admitting: *Deleted

## 2014-10-27 NOTE — Telephone Encounter (Signed)
PA completed online, but see pt's mychart response, she has always paid out of pocket for it as insurance has never covered it.

## 2014-10-27 NOTE — Telephone Encounter (Signed)
Please call pt and notify her there her insurance is not covering temazepam.  I am not sure is she will want to pay for it or try to change.  If going to change, can schedule an appt to discuss options.  Also, if can contact pharmacy and see if any alternatives are given.

## 2014-10-27 NOTE — Telephone Encounter (Signed)
Spoke with pt, she states she has already purchased the Temazepam.  She states she will continue to purchase it as she has tried others which were not effective.

## 2014-10-27 NOTE — Telephone Encounter (Signed)
Julie Hoover from [facility] called states Temazepam Rx is denied as it is not covered under the formulary.  No alternatives given

## 2014-10-28 ENCOUNTER — Other Ambulatory Visit: Payer: Self-pay | Admitting: Internal Medicine

## 2014-10-28 DIAGNOSIS — Z1239 Encounter for other screening for malignant neoplasm of breast: Secondary | ICD-10-CM

## 2014-10-28 DIAGNOSIS — Z853 Personal history of malignant neoplasm of breast: Secondary | ICD-10-CM

## 2014-10-28 NOTE — Progress Notes (Signed)
Order placed for right breast screening mammogram.  

## 2014-10-31 ENCOUNTER — Ambulatory Visit (INDEPENDENT_AMBULATORY_CARE_PROVIDER_SITE_OTHER): Payer: Medicare Other

## 2014-10-31 DIAGNOSIS — E538 Deficiency of other specified B group vitamins: Secondary | ICD-10-CM | POA: Diagnosis not present

## 2014-10-31 MED ORDER — CYANOCOBALAMIN 1000 MCG/ML IJ SOLN
1000.0000 ug | Freq: Once | INTRAMUSCULAR | Status: AC
  Administered 2014-10-31: 1000 ug via INTRAMUSCULAR

## 2014-11-03 ENCOUNTER — Encounter: Payer: Self-pay | Admitting: *Deleted

## 2014-11-07 ENCOUNTER — Ambulatory Visit
Admission: RE | Admit: 2014-11-07 | Discharge: 2014-11-07 | Disposition: A | Discharge status: Home / Self Care | Payer: Medicare Other | Source: Ambulatory Visit | Attending: Internal Medicine | Admitting: Internal Medicine

## 2014-11-07 DIAGNOSIS — Z1231 Encounter for screening mammogram for malignant neoplasm of breast: Secondary | ICD-10-CM | POA: Diagnosis not present

## 2014-11-07 DIAGNOSIS — Z1239 Encounter for other screening for malignant neoplasm of breast: Secondary | ICD-10-CM

## 2014-11-07 DIAGNOSIS — Z853 Personal history of malignant neoplasm of breast: Secondary | ICD-10-CM

## 2014-11-07 HISTORY — DX: Malignant (primary) neoplasm, unspecified: C80.1

## 2014-11-08 ENCOUNTER — Other Ambulatory Visit: Payer: Self-pay | Admitting: Internal Medicine

## 2014-11-20 ENCOUNTER — Other Ambulatory Visit: Payer: Self-pay | Admitting: Internal Medicine

## 2014-11-29 LAB — HM DIABETES EYE EXAM

## 2014-12-01 ENCOUNTER — Telehealth: Payer: Self-pay | Admitting: Internal Medicine

## 2014-12-01 DIAGNOSIS — N359 Urethral stricture, unspecified: Secondary | ICD-10-CM

## 2014-12-01 NOTE — Telephone Encounter (Signed)
Pt has requested a referral to [facility]n

## 2014-12-01 NOTE — Telephone Encounter (Signed)
Why does she need referral?

## 2014-12-05 ENCOUNTER — Other Ambulatory Visit: Payer: Self-pay | Admitting: Internal Medicine

## 2014-12-05 ENCOUNTER — Ambulatory Visit (INDEPENDENT_AMBULATORY_CARE_PROVIDER_SITE_OTHER): Payer: Medicare Other

## 2014-12-05 DIAGNOSIS — E538 Deficiency of other specified B group vitamins: Secondary | ICD-10-CM

## 2014-12-05 MED ORDER — CYANOCOBALAMIN 1000 MCG/ML IJ SOLN
1000.0000 ug | Freq: Once | INTRAMUSCULAR | Status: AC
  Administered 2014-12-05: 1000 ug via INTRAMUSCULAR

## 2014-12-05 NOTE — Telephone Encounter (Signed)
Received this msg from pt "I am having problems again emptying my bladder. Dr. Nicki Reaper knows about the scar tissue restrictions I have and has referred me to [facility] to dilate my bladder. in the past." please advise

## 2014-12-05 NOTE — Telephone Encounter (Signed)
Order placed for urology referral.  

## 2014-12-05 NOTE — Progress Notes (Signed)
Patient came in for monthly B12 injection.  Received injection in Left Deltoid.  Patient tolerated well.

## 2014-12-12 ENCOUNTER — Encounter: Payer: Self-pay | Admitting: *Deleted

## 2014-12-12 DIAGNOSIS — H4011X3 Primary open-angle glaucoma, severe stage: Secondary | ICD-10-CM | POA: Diagnosis not present

## 2014-12-19 DIAGNOSIS — H4011X3 Primary open-angle glaucoma, severe stage: Secondary | ICD-10-CM | POA: Diagnosis not present

## 2014-12-19 DIAGNOSIS — L570 Actinic keratosis: Secondary | ICD-10-CM | POA: Diagnosis not present

## 2014-12-19 DIAGNOSIS — H61032 Chondritis of left external ear: Secondary | ICD-10-CM | POA: Diagnosis not present

## 2014-12-19 DIAGNOSIS — B353 Tinea pedis: Secondary | ICD-10-CM | POA: Diagnosis not present

## 2014-12-19 DIAGNOSIS — Z1283 Encounter for screening for malignant neoplasm of skin: Secondary | ICD-10-CM | POA: Diagnosis not present

## 2014-12-20 ENCOUNTER — Ambulatory Visit (INDEPENDENT_AMBULATORY_CARE_PROVIDER_SITE_OTHER): Payer: Medicare Other | Admitting: Urology

## 2014-12-20 ENCOUNTER — Encounter: Payer: Self-pay | Admitting: Urology

## 2014-12-20 VITALS — BP 127/78 | HR 71 | Resp 16 | Ht 65.0 in | Wt 160.2 lb

## 2014-12-20 DIAGNOSIS — N343 Urethral syndrome, unspecified: Secondary | ICD-10-CM | POA: Diagnosis not present

## 2014-12-20 DIAGNOSIS — N362 Urethral caruncle: Secondary | ICD-10-CM

## 2014-12-20 DIAGNOSIS — N952 Postmenopausal atrophic vaginitis: Secondary | ICD-10-CM

## 2014-12-20 MED ORDER — ESTRADIOL 0.1 MG/GM VA CREA: 1.0000 | TOPICAL_CREAM | VAGINAL | Status: DC

## 2014-12-20 MED ORDER — AMOXICILLIN-POT CLAVULANATE 875-125 MG PO TABS: 1.0000 | ORAL_TABLET | Freq: Two times a day (BID) | ORAL | Status: DC

## 2014-12-20 NOTE — Patient Instructions (Signed)
Patient was given a sample of vaginal estrogen cream and instructed to apply 0.5mg  (pea-sized amount)  just inside the vaginal introitus with a finger-tip every night for two weeks and then Monday, Wednesday and Friday nights.

## 2014-12-20 NOTE — Progress Notes (Signed)
12/20/2014 9:28 AM   Julie Hoover 1929-11-20 [id number]  Referring provider: Einar Pheasant, MD [address] [place], [place] [address]  Chief Complaint  Patient presents with  . Urethral Stricture    HPI: Patient is an 81 year old white female who presents today complaining of suprapubic pressure and feeling of incomplete bladder emptying.    She is also experiencing urinary frequency, urgency, intermittency, straining and urinary hesitancy.  She denies any dysuria, gross hematuria or low back pain. She's not had any recent fevers, chills, nausea or vomiting.  She is undergone urethral dilation in the past and has found ineffective for her symptoms and she is requesting dilation today.  Last visit with [place] was in February 2015 and she was dilated at that appointment.   PMH: Past Medical History  Diagnosis Date  . Hypertension   . Hypercholesterolemia   . Hyperglycemia   . Hypothyroidism   . Diverticulosis   . Environmental allergies   . Arthritis   . GERD (gastroesophageal reflux disease)   . Glaucoma   . Colon polyps   . Chicken pox   . Breast cancer 1986    s/p right lumpectomy, XRT and Tamoxifen  . Cancer     skin ca  . Urethral stricture   . Osteopenia   . Peripheral neuropathy   . Diabetes     Surgical History: Past Surgical History  Procedure Laterality Date  . Breast lumpectomy  1986    right  . Tubal ligation    . Urethral dilation    . Knee surgery  06/2008    torn meniscus, right  . Knee surgery  2012    torn menisucs, right  . Breast biopsy Right 6/86    lumpectomy-positive/rad  . Mastectomy  1993    right, recurrence    Home Medications:    Medication List       This list is accurate as of: 12/20/14  9:28 AM.  Always use your most recent med list.               amLODipine 5 MG tablet  Commonly known as:  NORVASC  TAKE 1 BY MOUTH DAILY     aspirin 81 MG tablet  Take 81 mg by mouth daily.     CALCIUM 600+D 600-400 MG-UNIT per tablet  Generic drug:  Calcium Carbonate-Vitamin D  Take 1 tablet by mouth 3 (three) times daily.     cetirizine 10 MG tablet  Commonly known as:  ZYRTEC  Take 10 mg by mouth daily.     desoximetasone 0.05 % cream  Commonly known as:  TOPICORT     dorzolamide-timolol 22.3-6.8 MG/ML ophthalmic solution  Commonly known as:  COSOPT  Place 2 drops into the left eye 2 (two) times daily.     gabapentin 100 MG capsule  Commonly known as:  NEURONTIN  TAKE 1 BY MOUTH IN THE MORNING AND 2 BY MOUTH AT BEDTIME     glucose blood test strip  Commonly known as:  ONETOUCH VERIO  Check sugar daily Dx: E11.9     Lancets 30G Misc  One touch delica; to check blood sugars twice daily; E11.9     latanoprost 0.005 % ophthalmic solution  Commonly known as:  XALATAN  Place 1 drop into the left eye at bedtime.     levothyroxine 50 MCG tablet  Commonly known as:  SYNTHROID, LEVOTHROID  Take 1 tablet (50 mcg total) by mouth daily.  omeprazole 40 MG capsule  Commonly known as:  PRILOSEC  TAKE 1 BY MOUTH DAILY     ONETOUCH VERIO IQ SYSTEM W/DEVICE Kit  Use as directed Dx: E11.9     prednisoLONE acetate 1 % ophthalmic suspension  Commonly known as:  PRED FORTE  Place 1 drop into the right eye every other day.     RECLAST 5 MG/100ML Soln injection  Generic drug:  zoledronic acid  Inject 5 mg into the vein once.     simvastatin 10 MG tablet  Commonly known as:  ZOCOR  TAKE 1 BY MOUTH DAILY     temazepam 30 MG capsule  Commonly known as:  RESTORIL  Take 1 capsule (30 mg total) by mouth at bedtime as needed.     VITAMIN B-12 IJ  Inject as directed once. monthly        Allergies:  Allergies  Allergen Reactions  . Iodinated Diagnostic Agents     Other reaction(s): Blood Disorder Uncoded Allergy. Allergen: ANESTHESIA ENDING IN THANE  . Ciprofloxacin Other (See Comments)    Intolerance when taking large doses  . Gentian Root     Blisters   .  Halothane Other (See Comments)    Hepatitis and elevated liver enzymes  . Hydrocodone Itching  . Norco [Hydrocodone-Acetaminophen] Itching  . Sulfa Antibiotics Rash    Family History: Family History  Problem Relation Age of Onset  . Heart disease Father   . Hypertension Father   . COPD Mother   . Hypertension Brother     x2  . Hypercholesterolemia Brother     x2  . Hypertension Sister   . Hypercholesterolemia Sister   . Diabetes Mellitus II Sister   . Cancer      mouth, aunt  . Colon cancer Neg Hx     Social History:  reports that she has never smoked. She has never used smokeless tobacco. She reports that she does not drink alcohol or use illicit drugs.  ROS: UROLOGY Frequent Urination?: Yes Hard to postpone urination?: Yes Burning/pain with urination?: No Get up at night to urinate?: No Leakage of urine?: No Urine stream starts and stops?: Yes Trouble starting stream?: Yes Do you have to strain to urinate?: Yes Blood in urine?: No Urinary tract infection?: No Sexually transmitted disease?: No Injury to kidneys or bladder?: No Painful intercourse?: No Weak stream?: Yes Currently pregnant?: No Vaginal bleeding?: No Last menstrual period?: ???  Gastrointestinal Nausea?: No Vomiting?: No Indigestion/heartburn?: No Diarrhea?: No Constipation?: No  Constitutional Fever: No Night sweats?: No Weight loss?: No Fatigue?: No  Skin Skin rash/lesions?: No Itching?: No  Eyes Blurred vision?: No Double vision?: No  Ears/Nose/Throat Sore throat?: No Sinus problems?: No  Hematologic/Lymphatic Swollen glands?: No Easy bruising?: No  Cardiovascular Leg swelling?: No Chest pain?: No  Respiratory Cough?: No Shortness of breath?: No  Endocrine Excessive thirst?: No  Musculoskeletal Back pain?: No Joint pain?: No  Neurological Headaches?: No Dizziness?: No  Psychologic Depression?: No Anxiety?: No  Physical Exam: BP 127/78 mmHg  Pulse 71   Resp 16  Ht _0  (1.651 m)  Wt 160 lb 3.2 oz (72.666 kg)  BMI 26.66 kg/m2  GU: Atrophic external genitalia.   Urethral caruncle/prolaspe  at the meatus. No urethral masses and/or tenderness. No bladder fullness or masses. No vaginal lesions or discharge. Normal rectal tone, no masses. Normal anus and perineum.   Laboratory Data:  Lab Results  Component Value Date   WBC 7.4 05/24/2014  HGB 13.8 05/24/2014   HCT 41.4 05/24/2014   MCV 89.1 05/24/2014   PLT 257.0 05/24/2014    Lab Results  Component Value Date   CREATININE 1.03 09/25/2014   Lab Results  Component Value Date   HGBA1C 6.6* 09/25/2014    Procedure: Patient was placed in stirrups and urethra was cleaned using Betadine. She then had 2% lidocaine jelly inserted into the urethra and a sterile Q-tip was placed to keep the jelly in place.  The Q-tip was then removed.  Patient then was dilated to a 63 [place] with [employer] with no urine return. She tolerated the procedure well.  Assessment & Plan:    1. Urethral syndrome:   Patient underwent urethral dilation today without difficulty to her. She will return as needed for subsequent dilations.  I have given her a prescription for Augmentin to have on hand should she develop a urinary tract infection.  2. Atrophic vaginitis:    Patient was given a sample of vaginal estrogen cream (PREMARIN) and instructed to apply 0.13m (pea-sized amount)  just inside the vaginal introitus with a finger-tip every night for two weeks and then Monday, Wednesday and Friday nights.  I explained to the patient that vaginally administered estrogen, which causes only a slight increase in the blood estrogen levels, have fewer contraindications and adverse systemic effects that oral HT.  3. Urethral caruncle:   Patient was found to have a urethral caruncle which may be contributing to her obstructive urinary symptoms. Her PVR today was minimal.  She will apply the vaginal estrogen cream to the  caruncle.  She will return if her urinary symptoms do not improve.   No Follow-up on file.  [facility] [address] [place] [phone]

## 2015-01-02 ENCOUNTER — Other Ambulatory Visit: Payer: Self-pay | Admitting: Internal Medicine

## 2015-01-09 ENCOUNTER — Ambulatory Visit (INDEPENDENT_AMBULATORY_CARE_PROVIDER_SITE_OTHER): Payer: Medicare Other

## 2015-01-09 DIAGNOSIS — Z23 Encounter for immunization: Secondary | ICD-10-CM

## 2015-01-09 DIAGNOSIS — E538 Deficiency of other specified B group vitamins: Secondary | ICD-10-CM | POA: Diagnosis not present

## 2015-01-09 MED ORDER — CYANOCOBALAMIN 1000 MCG/ML IJ SOLN
1000.0000 ug | Freq: Once | INTRAMUSCULAR | Status: AC
  Administered 2015-01-09: 1000 ug via INTRAMUSCULAR

## 2015-01-09 NOTE — Progress Notes (Signed)
Patient came in for B12 injection and flu shot.  Received B12 in Right deltoid and Flu in Left deltoid.  Patient tolerated well.

## 2015-01-12 ENCOUNTER — Other Ambulatory Visit: Payer: Self-pay | Admitting: *Deleted

## 2015-01-12 ENCOUNTER — Other Ambulatory Visit: Payer: Self-pay | Admitting: Internal Medicine

## 2015-01-12 MED ORDER — LEVOTHYROXINE SODIUM 50 MCG PO TABS: 50.0000 ug | ORAL_TABLET | Freq: Every day | ORAL | Status: DC

## 2015-01-23 ENCOUNTER — Other Ambulatory Visit (INDEPENDENT_AMBULATORY_CARE_PROVIDER_SITE_OTHER): Payer: Medicare Other

## 2015-01-23 DIAGNOSIS — I10 Essential (primary) hypertension: Secondary | ICD-10-CM | POA: Diagnosis not present

## 2015-01-23 DIAGNOSIS — E7800 Pure hypercholesterolemia, unspecified: Secondary | ICD-10-CM

## 2015-01-23 DIAGNOSIS — E119 Type 2 diabetes mellitus without complications: Secondary | ICD-10-CM | POA: Diagnosis not present

## 2015-01-23 LAB — MICROALBUMIN / CREATININE URINE RATIO
Creatinine,U: 154.3 mg/dL
Microalb Creat Ratio: 0.5 mg/g (ref 0.0–30.0)
Microalb, Ur: <0.7 mg/dL (ref 0.0–1.9)

## 2015-01-23 LAB — HEPATIC FUNCTION PANEL
ALK PHOS: 54 U/L (ref 39–117)
ALT: 12 U/L (ref 0–35)
AST: 15 U/L (ref 0–37)
Albumin: 4.2 g/dL (ref 3.5–5.2)
Bilirubin, Direct: 0.1 mg/dL (ref 0.0–0.3)
TOTAL PROTEIN: 7.5 g/dL (ref 6.0–8.3)
Total Bilirubin: 0.5 mg/dL (ref 0.2–1.2)

## 2015-01-23 LAB — BASIC METABOLIC PANEL
BUN: 15 mg/dL (ref 6–23)
CO2: 28 mEq/L (ref 19–32)
Calcium: 9.9 mg/dL (ref 8.4–10.5)
Chloride: 106 mEq/L (ref 96–112)
Creatinine, Ser: 1.06 mg/dL (ref 0.40–1.20)
GFR: 52.58 mL/min — AB (ref >60.00)
Glucose, Bld: 108 mg/dL — ABNORMAL HIGH (ref 70–99)
Potassium: 4.5 mEq/L (ref 3.5–5.1)
Sodium: 142 mEq/L (ref 135–145)

## 2015-01-23 LAB — LIPID PANEL
CHOL/HDL RATIO: 3
Cholesterol: 171 mg/dL (ref 0–200)
HDL: 65.4 mg/dL (ref >39.00)
LDL Cholesterol: 94 mg/dL (ref 0–99)
NonHDL: 106.09
TRIGLYCERIDES: 59 mg/dL (ref 0.0–149.0)
VLDL: 11.8 mg/dL (ref 0.0–40.0)

## 2015-01-23 LAB — HEMOGLOBIN A1C: HEMOGLOBIN A1C: 6.6 % — AB (ref 4.6–6.5)

## 2015-01-24 ENCOUNTER — Encounter: Payer: Self-pay | Admitting: Internal Medicine

## 2015-01-29 ENCOUNTER — Ambulatory Visit (INDEPENDENT_AMBULATORY_CARE_PROVIDER_SITE_OTHER): Payer: Medicare Other | Admitting: Internal Medicine

## 2015-01-29 ENCOUNTER — Encounter: Payer: Self-pay | Admitting: Internal Medicine

## 2015-01-29 VITALS — BP 120/60 | HR 62 | Temp 98.0°F | Resp 18 | Ht 65.0 in | Wt 161.5 lb

## 2015-01-29 DIAGNOSIS — Z23 Encounter for immunization: Secondary | ICD-10-CM | POA: Diagnosis not present

## 2015-01-29 DIAGNOSIS — I10 Essential (primary) hypertension: Secondary | ICD-10-CM | POA: Diagnosis not present

## 2015-01-29 DIAGNOSIS — C50911 Malignant neoplasm of unspecified site of right female breast: Secondary | ICD-10-CM

## 2015-01-29 DIAGNOSIS — E119 Type 2 diabetes mellitus without complications: Secondary | ICD-10-CM

## 2015-01-29 DIAGNOSIS — E039 Hypothyroidism, unspecified: Secondary | ICD-10-CM

## 2015-01-29 DIAGNOSIS — N359 Urethral stricture, unspecified: Secondary | ICD-10-CM

## 2015-01-29 DIAGNOSIS — G6289 Other specified polyneuropathies: Secondary | ICD-10-CM | POA: Diagnosis not present

## 2015-01-29 DIAGNOSIS — E7800 Pure hypercholesterolemia, unspecified: Secondary | ICD-10-CM

## 2015-01-29 LAB — HM DIABETES FOOT EXAM

## 2015-01-29 NOTE — Progress Notes (Signed)
Patient ID: Julie Hoover, female   DOB: Sep 05, 1929, 81 y.o.   MRN: [id number]   Subjective:    Patient ID: Julie Hoover, female    DOB: February 14, 1930, 81 y.o.   MRN: [id number]  HPI  Patient with past history of hypertension, diabetes, hypothyroidism and hypercholesterolemia.  She comes in today to follow up on these issues.  Was having urinary issues.  See previous notes.  Saw urology.  S/p urethral dilatation.  Still some straining, but overall better.  Elected not to use the estrogen cream.  Desires no further intervention at this point.  Sugars reviewed and attached.  AM sugars averaging 92-106 and pm sugars averaging 130-150s.  We discussed diet and exercise.  No cardiac symptoms with increased activity or exertion.  No sob.  If takes omeprazole regularly has no problems with acid reflux.  No significant swallowing issues.  No abdominal pain or cramping.  Bowels stable.     Past Medical History  Diagnosis Date  . Hypertension   . Hypercholesterolemia   . Hyperglycemia   . Hypothyroidism   . Diverticulosis   . Environmental allergies   . Arthritis   . GERD (gastroesophageal reflux disease)   . Glaucoma   . Colon polyps   . Chicken pox   . Breast cancer ([place]) 1986    s/p right lumpectomy, XRT and Tamoxifen  . Cancer ([place])     skin ca  . Urethral stricture   . Osteopenia   . Peripheral neuropathy ([place])   . Diabetes [facility])    Past Surgical History  Procedure Laterality Date  . Breast lumpectomy  1986    right  . Tubal ligation    . Urethral dilation    . Knee surgery  06/2008    torn meniscus, right  . Knee surgery  2012    torn menisucs, right  . Breast biopsy Right 6/86    lumpectomy-positive/rad  . Mastectomy  1993    right, recurrence   Family History  Problem Relation Age of Onset  . Heart disease Father   . Hypertension Father   . COPD Mother   . Hypertension Brother     x2  . Hypercholesterolemia Brother     x2  . Hypertension Sister   .  Hypercholesterolemia Sister   . Diabetes Mellitus II Sister   . Cancer      mouth, aunt  . Colon cancer Neg Hx    Social History   Social History  . Marital Status: Widowed    Spouse Name: N/A  . Number of Children: 2  . Years of Education: N/A   Social History Main Topics  . Smoking status: Never Smoker   . Smokeless tobacco: Never Used  . Alcohol Use: No  . Drug Use: No  . Sexual Activity: Not Asked   Other Topics Concern  . None   Social History Narrative    Outpatient Encounter Prescriptions as of 01/29/2015  Medication Sig  . amLODipine (NORVASC) 5 MG tablet TAKE 1 BY MOUTH DAILY  . aspirin 81 MG tablet Take 81 mg by mouth daily.  . Blood Glucose Monitoring Suppl (ONETOUCH VERIO IQ SYSTEM) W/DEVICE KIT Use as directed Dx: E11.9 (Patient taking differently: Check sugars twice a day, twice a week Dx: E11.9)  . Calcium Carbonate-Vitamin D (CALCIUM 600+D) 600-400 MG-UNIT per tablet Take 1 tablet by mouth 3 (three) times daily.   . cetirizine (ZYRTEC) 10 MG tablet Take 10 mg by mouth daily.  Marland Kitchen  Cyanocobalamin (VITAMIN B-12 IJ) Inject as directed once. monthly  . desoximetasone (TOPICORT) 0.05 % cream   . dorzolamide-timolol (COSOPT) 22.3-6.8 MG/ML ophthalmic solution Place 2 drops into the left eye 2 (two) times daily.   Marland Kitchen gabapentin (NEURONTIN) 100 MG capsule TAKE 1 BY MOUTH IN THE MORNING AND 2 AT BEDTIME  . glucose blood (ONETOUCH VERIO) test strip Check sugar daily Dx: E11.9 (Patient taking differently: Check sugars twice a day, twice a week Dx: E11.9)  . Lancets 30G MISC One touch delica; to check blood sugars twice daily; E11.9  . latanoprost (XALATAN) 0.005 % ophthalmic solution Place 1 drop into the left eye at bedtime.  Marland Kitchen levothyroxine (SYNTHROID, LEVOTHROID) 50 MCG tablet Take 1 tablet (50 mcg total) by mouth daily.  Marland Kitchen omeprazole (PRILOSEC) 40 MG capsule TAKE 1 BY MOUTH DAILY  . simvastatin (ZOCOR) 10 MG tablet TAKE 1 BY MOUTH DAILY  . temazepam (RESTORIL) 30 MG  capsule Take 1 capsule (30 mg total) by mouth at bedtime as needed.  . zoledronic acid (RECLAST) 5 MG/100ML SOLN Inject 5 mg into the vein once.  . [DISCONTINUED] amoxicillin-clavulanate (AUGMENTIN) 875-125 MG per tablet Take 1 tablet by mouth every 12 (twelve) hours.  . [DISCONTINUED] estradiol (ESTRACE VAGINAL) 0.1 MG/GM vaginal cream Place 1 Applicatorful vaginally 3 (three) times a week. Use your fingertip to apply the medication, not the applicator  . [DISCONTINUED] prednisoLONE acetate (PRED FORTE) 1 % ophthalmic suspension Place 1 drop into the right eye every other day.   No facility-administered encounter medications on file as of 01/29/2015.    Review of Systems  Constitutional: Negative for appetite change and unexpected weight change.  HENT: Negative for congestion and sinus pressure.   Eyes: Negative for pain and visual disturbance.  Respiratory: Negative for cough, chest tightness and shortness of breath.   Cardiovascular: Negative for chest pain, palpitations and leg swelling.  Gastrointestinal: Negative for nausea, vomiting, abdominal pain and diarrhea.  Genitourinary: Negative for dysuria.       Occasional straining with urination as outlined.   Musculoskeletal: Negative for back pain and joint swelling.  Skin: Negative for color change and rash.  Neurological: Negative for dizziness, light-headedness and headaches.  Psychiatric/Behavioral: Negative for dysphoric mood and agitation.       Objective:     Blood pressure rechecked by me:  134/78  Physical Exam  Constitutional: She appears well-developed and well-nourished. No distress.  HENT:  Nose: Nose normal.  Mouth/Throat: Oropharynx is clear and moist.  Eyes: Conjunctivae are normal. Right eye exhibits no discharge.  Neck: Neck supple. No thyromegaly present.  Cardiovascular: Normal rate and regular rhythm.   Pulmonary/Chest: Breath sounds normal. No respiratory distress. She has no wheezes.  Abdominal: Soft.  Bowel sounds are normal. There is no tenderness.  Musculoskeletal: She exhibits no edema or tenderness.  Lymphadenopathy:    She has no cervical adenopathy.  Skin: No rash noted. No erythema.  Psychiatric: She has a normal mood and affect. Her behavior is normal.    BP 120/60 mmHg  Pulse 62  Temp(Src) 98 F (36.7 C) (Oral)  Resp 18  Ht 5' 5"  (1.651 m)  Wt 161 lb 8 oz (73.256 kg)  BMI 26.88 kg/m2  SpO2 95% Wt Readings from Last 3 Encounters:  01/29/15 161 lb 8 oz (73.256 kg)  12/20/14 160 lb 3.2 oz (72.666 kg)  09/27/14 160 lb (72.576 kg)     Lab Results  Component Value Date   WBC 7.4 05/24/2014   HGB  13.8 05/24/2014   HCT 41.4 05/24/2014   PLT 257.0 05/24/2014   GLUCOSE 108* 01/23/2015   CHOL 171 01/23/2015   TRIG 59.0 01/23/2015   HDL 65.40 01/23/2015   LDLCALC 94 01/23/2015   ALT 12 01/23/2015   AST 15 01/23/2015   NA 142 01/23/2015   K 4.5 01/23/2015   CL 106 01/23/2015   CREATININE 1.06 01/23/2015   BUN 15 01/23/2015   CO2 28 01/23/2015   TSH 2.46 09/25/2014   HGBA1C 6.6* 01/23/2015   MICROALBUR <0.7 01/23/2015    Mm Digital Screening Unilat L  11/07/2014   CLINICAL DATA:  Screening.  EXAM: DIGITAL SCREENING UNILATERAL LEFT MAMMOGRAM WITH CAD  COMPARISON:  Previous exam(s).  ACR Breast Density Category b: There are scattered areas of fibroglandular density.  FINDINGS: The patient has had a right mastectomy. There are no findings suspicious for malignancy.  Images were processed with CAD.  IMPRESSION: No mammographic evidence of malignancy. A result letter of this screening mammogram will be mailed directly to the patient.  RECOMMENDATION: Screening mammogram in one year.  ([id number])  BI-RADS CATEGORY  1: Negative.   Electronically Signed   By: Pamelia Hoit M.D.   On: 11/07/2014 16:38       Assessment & Plan:   Problem List Items Addressed This Visit    Breast cancer [facility])    S/p right lumpectomy 1986.  S/p XRT and tamoxifen.  S/p right mastectomy for  reoccurrence 1993.  Mammogram 11/07/14 - Birads I.       Diabetes ([place])    Sugars have been well controlled.  Low carb diet and exercise.  Follow met b and a1c.  Up to date with eye exam.        Relevant Orders   Hemoglobin I0X   Basic metabolic panel   Hypercholesterolemia    Low cholesterol diet and exercise.  Follow lipid panel and liver function tests.  On simvastatin.        Relevant Orders   Lipid panel   Hepatic function panel   Hypertension    Blood pressure under good control.  Continue same medication regimen.  Follow pressures.  Follow metabolic panel.        Relevant Orders   CBC with Differential/Platelet   Hypothyroidism    On thyroid replacement.  Follow tsh.        Peripheral neuropathy ([facility])    On gabapentin.  Stable.  Follow.        Urethral stricture    Has required dilatation.  Recently evaluated.  Some straining, but better.  Follow.  Desires no further intervention.         Other Visit Diagnoses    Need for prophylactic vaccination against Streptococcus pneumoniae (pneumococcus)    -  Primary    Relevant Orders    Pneumococcal polysaccharide vaccine 23-valent greater than or equal to 2yo subcutaneous/IM (Completed)        Einar Pheasant, MD

## 2015-01-29 NOTE — Progress Notes (Signed)
Pre-visit discussion using our clinic review tool. No additional management support is needed unless otherwise documented below in the visit note.  

## 2015-02-04 ENCOUNTER — Encounter: Payer: Self-pay | Admitting: Internal Medicine

## 2015-02-04 NOTE — Assessment & Plan Note (Signed)
Sugars have been well controlled.  Low carb diet and exercise.  Follow met b and a1c.  Up to date with eye exam.

## 2015-02-04 NOTE — Assessment & Plan Note (Signed)
S/p right lumpectomy 1986.  S/p XRT and tamoxifen.  S/p right mastectomy for reoccurrence 1993.  Mammogram 11/07/14 - Birads I.

## 2015-02-04 NOTE — Assessment & Plan Note (Signed)
On gabapentin.  Stable.  Follow.

## 2015-02-04 NOTE — Assessment & Plan Note (Signed)
Blood pressure under good control.  Continue same medication regimen.  Follow pressures.  Follow metabolic panel.   

## 2015-02-04 NOTE — Assessment & Plan Note (Signed)
On thyroid replacement.  Follow tsh.  

## 2015-02-04 NOTE — Assessment & Plan Note (Signed)
Low cholesterol diet and exercise.  Follow lipid panel and liver function tests.  On simvastatin.   

## 2015-02-04 NOTE — Assessment & Plan Note (Signed)
Has required dilatation.  Recently evaluated.  Some straining, but better.  Follow.  Desires no further intervention.

## 2015-02-12 ENCOUNTER — Other Ambulatory Visit: Payer: Self-pay | Admitting: Internal Medicine

## 2015-02-13 ENCOUNTER — Ambulatory Visit: Payer: Self-pay

## 2015-02-13 DIAGNOSIS — H401133 Primary open-angle glaucoma, bilateral, severe stage: Secondary | ICD-10-CM | POA: Diagnosis not present

## 2015-02-14 ENCOUNTER — Ambulatory Visit (INDEPENDENT_AMBULATORY_CARE_PROVIDER_SITE_OTHER): Payer: Medicare Other

## 2015-02-14 DIAGNOSIS — E538 Deficiency of other specified B group vitamins: Secondary | ICD-10-CM | POA: Diagnosis not present

## 2015-02-14 MED ORDER — CYANOCOBALAMIN 1000 MCG/ML IJ SOLN
1000.0000 ug | Freq: Once | INTRAMUSCULAR | Status: AC
  Administered 2015-02-14: 1000 ug via INTRAMUSCULAR

## 2015-02-14 NOTE — Progress Notes (Signed)
Patient came in for monthly b12 shot.  Received in Left deltoid.  Patient tolerated well.

## 2015-02-20 DIAGNOSIS — L570 Actinic keratosis: Secondary | ICD-10-CM | POA: Diagnosis not present

## 2015-02-20 DIAGNOSIS — H61039 Chondritis of external ear, unspecified ear: Secondary | ICD-10-CM | POA: Diagnosis not present

## 2015-02-28 DIAGNOSIS — L03031 Cellulitis of right toe: Secondary | ICD-10-CM | POA: Diagnosis not present

## 2015-03-20 ENCOUNTER — Ambulatory Visit (INDEPENDENT_AMBULATORY_CARE_PROVIDER_SITE_OTHER): Payer: Medicare Other | Admitting: *Deleted

## 2015-03-20 DIAGNOSIS — E538 Deficiency of other specified B group vitamins: Secondary | ICD-10-CM

## 2015-03-20 DIAGNOSIS — Z23 Encounter for immunization: Secondary | ICD-10-CM

## 2015-03-20 MED ORDER — CYANOCOBALAMIN 1000 MCG/ML IJ SOLN
1000.0000 ug | Freq: Once | INTRAMUSCULAR | Status: AC
  Administered 2015-03-20: 1000 ug via INTRAMUSCULAR

## 2015-03-20 NOTE — Progress Notes (Signed)
Patient presented for B12 injection to right deltoid and tolerated well.

## 2015-03-21 ENCOUNTER — Other Ambulatory Visit: Payer: Self-pay | Admitting: Internal Medicine

## 2015-04-24 ENCOUNTER — Ambulatory Visit (INDEPENDENT_AMBULATORY_CARE_PROVIDER_SITE_OTHER): Payer: Medicare Other | Admitting: *Deleted

## 2015-04-24 ENCOUNTER — Telehealth: Payer: Self-pay | Admitting: Internal Medicine

## 2015-04-24 DIAGNOSIS — G6289 Other specified polyneuropathies: Secondary | ICD-10-CM | POA: Diagnosis not present

## 2015-04-24 MED ORDER — CYANOCOBALAMIN 1000 MCG/ML IJ SOLN
1000.0000 ug | Freq: Once | INTRAMUSCULAR | Status: AC
  Administered 2015-04-24: 1000 ug via INTRAMUSCULAR

## 2015-04-24 NOTE — Telephone Encounter (Signed)
I have updated her records.

## 2015-04-24 NOTE — Progress Notes (Signed)
Patient presented for B12 injection to left deltoid and tolerated well. 

## 2015-04-24 NOTE — Telephone Encounter (Signed)
Pt changed RX card to [facility]. Scanned into Demographics.

## 2015-05-04 DIAGNOSIS — H401133 Primary open-angle glaucoma, bilateral, severe stage: Secondary | ICD-10-CM | POA: Diagnosis not present

## 2015-05-21 ENCOUNTER — Other Ambulatory Visit (INDEPENDENT_AMBULATORY_CARE_PROVIDER_SITE_OTHER): Payer: Medicare Other

## 2015-05-21 DIAGNOSIS — I10 Essential (primary) hypertension: Secondary | ICD-10-CM

## 2015-05-21 DIAGNOSIS — E7800 Pure hypercholesterolemia, unspecified: Secondary | ICD-10-CM

## 2015-05-21 DIAGNOSIS — E119 Type 2 diabetes mellitus without complications: Secondary | ICD-10-CM | POA: Diagnosis not present

## 2015-05-21 LAB — BASIC METABOLIC PANEL
BUN: 19 mg/dL (ref 6–23)
CHLORIDE: 103 meq/L (ref 96–112)
CO2: 30 meq/L (ref 19–32)
Calcium: 9.7 mg/dL (ref 8.4–10.5)
Creatinine, Ser: 0.97 mg/dL (ref 0.40–1.20)
GFR: 58.2 mL/min — ABNORMAL LOW (ref >60.00)
Glucose, Bld: 115 mg/dL — ABNORMAL HIGH (ref 70–99)
Potassium: 4.5 mEq/L (ref 3.5–5.1)
SODIUM: 141 meq/L (ref 135–145)

## 2015-05-21 LAB — HEMOGLOBIN A1C: HEMOGLOBIN A1C: 6.7 % — AB (ref 4.6–6.5)

## 2015-05-21 LAB — LIPID PANEL
Cholesterol: 179 mg/dL (ref 0–200)
HDL: 64.5 mg/dL (ref >39.00)
LDL CALC: 100 mg/dL — AB (ref 0–99)
NONHDL: 114.63
Total CHOL/HDL Ratio: 3
Triglycerides: 75 mg/dL (ref 0.0–149.0)
VLDL: 15 mg/dL (ref 0.0–40.0)

## 2015-05-21 LAB — HEPATIC FUNCTION PANEL
ALBUMIN: 4.4 g/dL (ref 3.5–5.2)
ALT: 11 U/L (ref 0–35)
AST: 13 U/L (ref 0–37)
Alkaline Phosphatase: 50 U/L (ref 39–117)
BILIRUBIN TOTAL: 0.6 mg/dL (ref 0.2–1.2)
Bilirubin, Direct: 0.1 mg/dL (ref 0.0–0.3)
Total Protein: 7.8 g/dL (ref 6.0–8.3)

## 2015-05-21 LAB — CBC WITH DIFFERENTIAL/PLATELET
Basophils Absolute: 0 10*3/uL (ref 0.0–0.1)
Basophils Relative: 0.5 % (ref 0.0–3.0)
EOS PCT: 0.9 % (ref 0.0–5.0)
Eosinophils Absolute: 0.1 10*3/uL (ref 0.0–0.7)
HCT: 43.2 % (ref 36.0–46.0)
HEMOGLOBIN: 14 g/dL (ref 12.0–15.0)
LYMPHS PCT: 23.6 % (ref 12.0–46.0)
Lymphs Abs: 2.2 10*3/uL (ref 0.7–4.0)
MCHC: 32.5 g/dL (ref 30.0–36.0)
MCV: 91.1 fl (ref 78.0–100.0)
MONOS PCT: 8.1 % (ref 3.0–12.0)
Monocytes Absolute: 0.7 10*3/uL (ref 0.1–1.0)
Neutro Abs: 6.1 10*3/uL (ref 1.4–7.7)
Neutrophils Relative %: 66.9 % (ref 43.0–77.0)
Platelets: 256 10*3/uL (ref 150.0–400.0)
RBC: 4.74 Mil/uL (ref 3.87–5.11)
RDW: 14.8 % (ref 11.5–15.5)
WBC: 9.2 10*3/uL (ref 4.0–10.5)

## 2015-05-22 ENCOUNTER — Encounter: Payer: Self-pay | Admitting: Internal Medicine

## 2015-05-28 ENCOUNTER — Ambulatory Visit (INDEPENDENT_AMBULATORY_CARE_PROVIDER_SITE_OTHER): Payer: Medicare Other | Admitting: Internal Medicine

## 2015-05-28 ENCOUNTER — Encounter: Payer: Self-pay | Admitting: Internal Medicine

## 2015-05-28 VITALS — BP 122/70 | HR 77 | Temp 98.1°F | Resp 18 | Ht 65.0 in | Wt 161.4 lb

## 2015-05-28 DIAGNOSIS — E119 Type 2 diabetes mellitus without complications: Secondary | ICD-10-CM | POA: Diagnosis not present

## 2015-05-28 DIAGNOSIS — E039 Hypothyroidism, unspecified: Secondary | ICD-10-CM | POA: Diagnosis not present

## 2015-05-28 DIAGNOSIS — E538 Deficiency of other specified B group vitamins: Secondary | ICD-10-CM

## 2015-05-28 DIAGNOSIS — Z853 Personal history of malignant neoplasm of breast: Secondary | ICD-10-CM

## 2015-05-28 DIAGNOSIS — E7800 Pure hypercholesterolemia, unspecified: Secondary | ICD-10-CM

## 2015-05-28 DIAGNOSIS — G6289 Other specified polyneuropathies: Secondary | ICD-10-CM

## 2015-05-28 DIAGNOSIS — I10 Essential (primary) hypertension: Secondary | ICD-10-CM | POA: Diagnosis not present

## 2015-05-28 DIAGNOSIS — M25512 Pain in left shoulder: Secondary | ICD-10-CM

## 2015-05-28 MED ORDER — SIMVASTATIN 10 MG PO TABS: ORAL_TABLET | ORAL | Status: DC

## 2015-05-28 MED ORDER — CYANOCOBALAMIN 1000 MCG/ML IJ SOLN
1000.0000 ug | Freq: Once | INTRAMUSCULAR | Status: AC
  Administered 2015-05-28: 1000 ug via INTRAMUSCULAR

## 2015-05-28 NOTE — Progress Notes (Signed)
Pre-visit discussion using our clinic review tool. No additional management support is needed unless otherwise documented below in the visit note.  

## 2015-05-28 NOTE — Progress Notes (Signed)
Patient ID: Julie Hoover, female   DOB: Apr 25, 1929, 82 y.o.   MRN: [id number]   Subjective:    Patient ID: Julie Hoover, female    DOB: Feb 20, 1930, 82 y.o.   MRN: [id number]  HPI  Patient with past history of hypercholesterolemia, diabetes, hypertension and hypothyroidism.  She comes in today to follow up on these issues.  She states she is doing well.  Tries to stay active.  No cardiac symptoms with increased activity or exertion.  Does reports some left shoulder pain.  Hurts with full extension.  Good rom.  No known injury or trauma.  Not taking any medication for pain.  No acid reflux.  No abdominal pain or cramping.  Bowels stable.     Past Medical History  Diagnosis Date  . Hypertension   . Hypercholesterolemia   . Hyperglycemia   . Hypothyroidism   . Diverticulosis   . Environmental allergies   . Arthritis   . GERD (gastroesophageal reflux disease)   . Glaucoma   . Colon polyps   . Chicken pox   . Breast cancer ([place]) 1986    s/p right lumpectomy, XRT and Tamoxifen  . Cancer ([place])     skin ca  . Urethral stricture   . Osteopenia   . Peripheral neuropathy ([place])   . Diabetes [facility])    Past Surgical History  Procedure Laterality Date  . Breast lumpectomy  1986    right  . Tubal ligation    . Urethral dilation    . Knee surgery  06/2008    torn meniscus, right  . Knee surgery  2012    torn menisucs, right  . Breast biopsy Right 6/86    lumpectomy-positive/rad  . Mastectomy  1993    right, recurrence   Family History  Problem Relation Age of Onset  . Heart disease Father   . Hypertension Father   . COPD Mother   . Hypertension Brother     x2  . Hypercholesterolemia Brother     x2  . Hypertension Sister   . Hypercholesterolemia Sister   . Diabetes Mellitus II Sister   . Cancer      mouth, aunt  . Colon cancer Neg Hx    Social History   Social History  . Marital Status: Widowed    Spouse Name: N/A  . Number of Children: 2  . Years of  Education: N/A   Social History Main Topics  . Smoking status: Never Smoker   . Smokeless tobacco: Never Used  . Alcohol Use: No  . Drug Use: No  . Sexual Activity: Not Asked   Other Topics Concern  . None   Social History Narrative    Outpatient Encounter Prescriptions as of 05/28/2015  Medication Sig  . amLODipine (NORVASC) 5 MG tablet TAKE 1 BY MOUTH DAILY  . aspirin 81 MG tablet Take 81 mg by mouth daily.  . Blood Glucose Monitoring Suppl (ONETOUCH VERIO IQ SYSTEM) W/DEVICE KIT Use as directed Dx: E11.9 (Patient taking differently: Check sugars twice a day, twice a week Dx: E11.9)  . Calcium Carbonate-Vitamin D (CALCIUM 600+D) 600-400 MG-UNIT per tablet Take 1 tablet by mouth 3 (three) times daily.   . cetirizine (ZYRTEC) 10 MG tablet Take 10 mg by mouth daily.  . Cyanocobalamin (VITAMIN B-12 IJ) Inject as directed once. monthly  . desoximetasone (TOPICORT) 0.05 % cream   . dorzolamide-timolol (COSOPT) 22.3-6.8 MG/ML ophthalmic solution Place 2 drops into the left eye 2 (two) times  daily.   . gabapentin (NEURONTIN) 100 MG capsule TAKE 1 BY MOUTH IN THE MORNING AND 2 AT BEDTIME  . glucose blood (ONETOUCH VERIO) test strip Check sugar daily Dx: E11.9 (Patient taking differently: Check sugars twice a day, twice a week Dx: E11.9)  . Lancets 30G MISC One touch delica; to check blood sugars twice daily; E11.9  . latanoprost (XALATAN) 0.005 % ophthalmic solution Place 1 drop into the left eye at bedtime.  Marland Kitchen levothyroxine (SYNTHROID, LEVOTHROID) 50 MCG tablet Take 1 tablet (50 mcg total) by mouth daily.  Marland Kitchen omeprazole (PRILOSEC) 40 MG capsule TAKE 1 BY MOUTH DAILY  . simvastatin (ZOCOR) 10 MG tablet TAKE 1 BY MOUTH DAILY  . temazepam (RESTORIL) 30 MG capsule Take 1 capsule (30 mg total) by mouth at bedtime as needed.  . zoledronic acid (RECLAST) 5 MG/100ML SOLN Inject 5 mg into the vein once.  . [DISCONTINUED] simvastatin (ZOCOR) 10 MG tablet TAKE 1 BY MOUTH DAILY  . [EXPIRED]  cyanocobalamin ((VITAMIN B-12)) injection 1,000 mcg    No facility-administered encounter medications on file as of 05/28/2015.    Review of Systems  Constitutional: Negative for appetite change and unexpected weight change.  HENT: Negative for congestion and sinus pressure.   Respiratory: Negative for cough, chest tightness and shortness of breath.   Cardiovascular: Negative for chest pain, palpitations and leg swelling.  Gastrointestinal: Negative for nausea, vomiting, abdominal pain and diarrhea.  Genitourinary: Negative for dysuria and difficulty urinating.  Musculoskeletal: Negative for joint swelling.       Left shoulder pain as outlined.   Skin: Negative for color change and rash.  Neurological: Negative for dizziness, light-headedness and headaches.  Psychiatric/Behavioral: Negative for dysphoric mood and agitation.       Objective:     Blood pressure rechecked by me:  126/68  Physical Exam  Constitutional: She appears well-developed and well-nourished. No distress.  HENT:  Nose: Nose normal.  Mouth/Throat: Oropharynx is clear and moist.  Neck: Neck supple. No thyromegaly present.  Cardiovascular: Normal rate and regular rhythm.   Pulmonary/Chest: Breath sounds normal. No respiratory distress. She has no wheezes.  Abdominal: Soft. Bowel sounds are normal. There is no tenderness.  Musculoskeletal: She exhibits no edema or tenderness.  No pain to palpation over the left shoulder.  Some minimal discomfort with full extension of the left arm - localized to top of shoulder.  Good rom.    Lymphadenopathy:    She has no cervical adenopathy.  Skin: No rash noted. No erythema.  Psychiatric: She has a normal mood and affect. Her behavior is normal.    BP 122/70 mmHg  Pulse 77  Temp(Src) 98.1 F (36.7 C) (Oral)  Resp 18  Ht 5' 5"  (1.651 m)  Wt 161 lb 6 oz (73.199 kg)  BMI 26.85 kg/m2  SpO2 97% Wt Readings from Last 3 Encounters:  05/28/15 161 lb 6 oz (73.199 kg)    01/29/15 161 lb 8 oz (73.256 kg)  12/20/14 160 lb 3.2 oz (72.666 kg)     Lab Results  Component Value Date   WBC 9.2 05/21/2015   HGB 14.0 05/21/2015   HCT 43.2 05/21/2015   PLT 256.0 05/21/2015   GLUCOSE 115* 05/21/2015   CHOL 179 05/21/2015   TRIG 75.0 05/21/2015   HDL 64.50 05/21/2015   LDLCALC 100* 05/21/2015   ALT 11 05/21/2015   AST 13 05/21/2015   NA 141 05/21/2015   K 4.5 05/21/2015   CL 103 05/21/2015   CREATININE  0.97 05/21/2015   BUN 19 05/21/2015   CO2 30 05/21/2015   TSH 2.46 09/25/2014   HGBA1C 6.7* 05/21/2015   MICROALBUR <0.7 01/23/2015    Mm Digital Screening Unilat L  11/07/2014  CLINICAL DATA:  Screening. EXAM: DIGITAL SCREENING UNILATERAL LEFT MAMMOGRAM WITH CAD COMPARISON:  Previous exam(s). ACR Breast Density Category b: There are scattered areas of fibroglandular density. FINDINGS: The patient has had a right mastectomy. There are no findings suspicious for malignancy. Images were processed with CAD. IMPRESSION: No mammographic evidence of malignancy. A result letter of this screening mammogram will be mailed directly to the patient. RECOMMENDATION: Screening mammogram in one year.  (SM-L-37M) BI-RADS CATEGORY  1: Negative. Electronically Signed   By: Pamelia Hoit M.D.   On: 11/07/2014 16:38       Assessment & Plan:   Problem List Items Addressed This Visit    Diabetes ([place])    Sugars have been doing well.  Recent a1c 6.7.  Continue diet and exercise.  Follow.        Relevant Medications   simvastatin (ZOCOR) 10 MG tablet   Other Relevant Orders   Hemoglobin V7K   Basic metabolic panel   History of breast cancer    Mammogram 11/07/14 - birads I.       Hypercholesterolemia    Low cholesterol diet and exercise.  Follow lipid panel and liver function tests.  On simvastatin.   Lab Results  Component Value Date   CHOL 179 05/21/2015   HDL 64.50 05/21/2015   LDLCALC 100* 05/21/2015   TRIG 75.0 05/21/2015   CHOLHDL 3 05/21/2015         Relevant Medications   simvastatin (ZOCOR) 10 MG tablet   Other Relevant Orders   Lipid panel   Hepatic function panel   Hypertension    Blood pressure under good control.  Continue same medication regimen.  Follow pressures.  Follow metabolic panel.        Relevant Medications   simvastatin (ZOCOR) 10 MG tablet   Hypothyroidism    On thyroid replacement.  Follow tsh.       Relevant Orders   TSH   Left shoulder pain    Exam as outlined.  Tylenol as directed.  Follow.  Notify me if desires any further evaluation.        Peripheral neuropathy ([facility])    Check B12.  On gabapentin.  Stable.         Other Visit Diagnoses    B12 deficiency    -  Primary    Relevant Medications    cyanocobalamin ((VITAMIN B-12)) injection 1,000 mcg (Completed)    Other Relevant Orders    Vitamin B12        Einar Pheasant, MD

## 2015-06-11 ENCOUNTER — Encounter: Payer: Self-pay | Admitting: Internal Medicine

## 2015-06-11 DIAGNOSIS — M25512 Pain in left shoulder: Secondary | ICD-10-CM | POA: Insufficient documentation

## 2015-06-11 NOTE — Assessment & Plan Note (Signed)
On thyroid replacement.  Follow tsh.  

## 2015-06-11 NOTE — Assessment & Plan Note (Signed)
Exam as outlined.  Tylenol as directed.  Follow.  Notify me if desires any further evaluation.

## 2015-06-11 NOTE — Assessment & Plan Note (Signed)
Low cholesterol diet and exercise.  Follow lipid panel and liver function tests.  On simvastatin.   Lab Results  Component Value Date   CHOL 179 05/21/2015   HDL 64.50 05/21/2015   LDLCALC 100* 05/21/2015   TRIG 75.0 05/21/2015   CHOLHDL 3 05/21/2015

## 2015-06-11 NOTE — Assessment & Plan Note (Signed)
Blood pressure under good control.  Continue same medication regimen.  Follow pressures.  Follow metabolic panel.   

## 2015-06-11 NOTE — Assessment & Plan Note (Signed)
Check B12.  On gabapentin.  Stable.

## 2015-06-11 NOTE — Assessment & Plan Note (Signed)
Mammogram 11/07/14 - birads I.

## 2015-06-11 NOTE — Assessment & Plan Note (Signed)
Sugars have been doing well.  Recent a1c 6.7.  Continue diet and exercise.  Follow.

## 2015-07-03 ENCOUNTER — Ambulatory Visit (INDEPENDENT_AMBULATORY_CARE_PROVIDER_SITE_OTHER): Payer: Medicare Other | Admitting: *Deleted

## 2015-07-03 DIAGNOSIS — E538 Deficiency of other specified B group vitamins: Secondary | ICD-10-CM | POA: Diagnosis not present

## 2015-07-03 MED ORDER — CYANOCOBALAMIN 1000 MCG/ML IJ SOLN
1000.0000 ug | Freq: Once | INTRAMUSCULAR | Status: AC
  Administered 2015-07-03: 1000 ug via INTRAMUSCULAR

## 2015-07-03 NOTE — Progress Notes (Signed)
Patient presented for B12 injection to left deltoid and tolerated well. 

## 2015-07-13 DIAGNOSIS — S0081XA Abrasion of other part of head, initial encounter: Secondary | ICD-10-CM | POA: Diagnosis not present

## 2015-07-13 DIAGNOSIS — L570 Actinic keratosis: Secondary | ICD-10-CM | POA: Diagnosis not present

## 2015-07-30 ENCOUNTER — Telehealth: Payer: Self-pay | Admitting: Internal Medicine

## 2015-07-30 NOTE — Telephone Encounter (Signed)
Pt would like to have her gabapentin (NEURONTIN) 100 MG capsule refilled through mail order.

## 2015-07-31 MED ORDER — GABAPENTIN 100 MG PO CAPS: ORAL_CAPSULE | ORAL | Status: DC

## 2015-07-31 NOTE — Telephone Encounter (Signed)
Refilled per request.

## 2015-08-04 ENCOUNTER — Encounter: Payer: Self-pay | Admitting: Internal Medicine

## 2015-08-04 ENCOUNTER — Other Ambulatory Visit: Payer: Self-pay | Admitting: *Deleted

## 2015-08-04 MED ORDER — AMLODIPINE BESYLATE 5 MG PO TABS: ORAL_TABLET | ORAL | Status: DC

## 2015-08-04 MED ORDER — LEVOTHYROXINE SODIUM 50 MCG PO TABS: 50.0000 ug | ORAL_TABLET | Freq: Every day | ORAL | Status: DC

## 2015-08-04 MED ORDER — OMEPRAZOLE 40 MG PO CPDR: DELAYED_RELEASE_CAPSULE | ORAL | Status: DC

## 2015-08-07 ENCOUNTER — Ambulatory Visit (INDEPENDENT_AMBULATORY_CARE_PROVIDER_SITE_OTHER): Payer: Medicare Other

## 2015-08-07 DIAGNOSIS — E538 Deficiency of other specified B group vitamins: Secondary | ICD-10-CM

## 2015-08-07 MED ORDER — CYANOCOBALAMIN 1000 MCG/ML IJ SOLN
1000.0000 ug | Freq: Once | INTRAMUSCULAR | Status: AC
  Administered 2015-08-07: 1000 ug via INTRAMUSCULAR

## 2015-08-07 NOTE — Progress Notes (Signed)
patient is receiving a B12 in her right deltoid. Patient tolerated well.

## 2015-08-26 IMAGING — MG MM DIGITAL SCREENING UNILAT*L* W/ CAD
1 series · 2 of 2 positions shown · non-contrast
Comparison: Previous exam(s).

CLINICAL DATA: Screening.

EXAM:
DIGITAL SCREENING UNILATERAL LEFT MAMMOGRAM WITH CAD

[L CC · left · 2 of 2 slices shown]
[im 1/2]
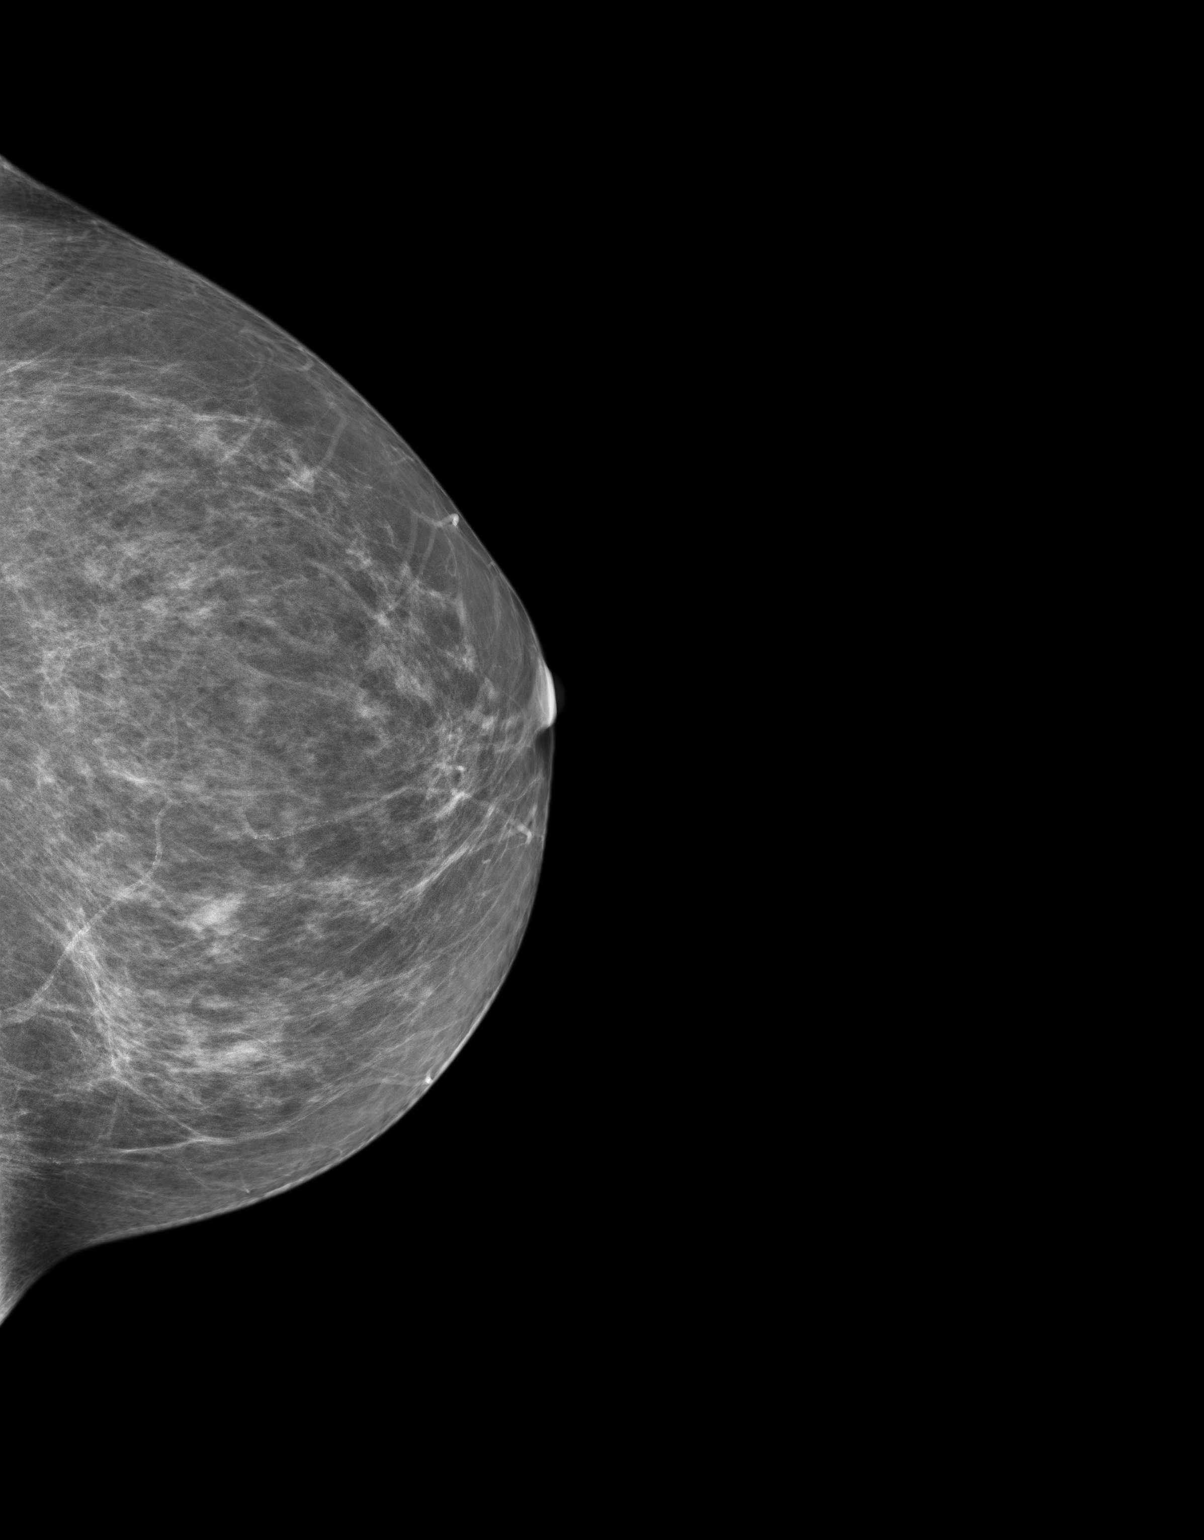
[im 2/2]
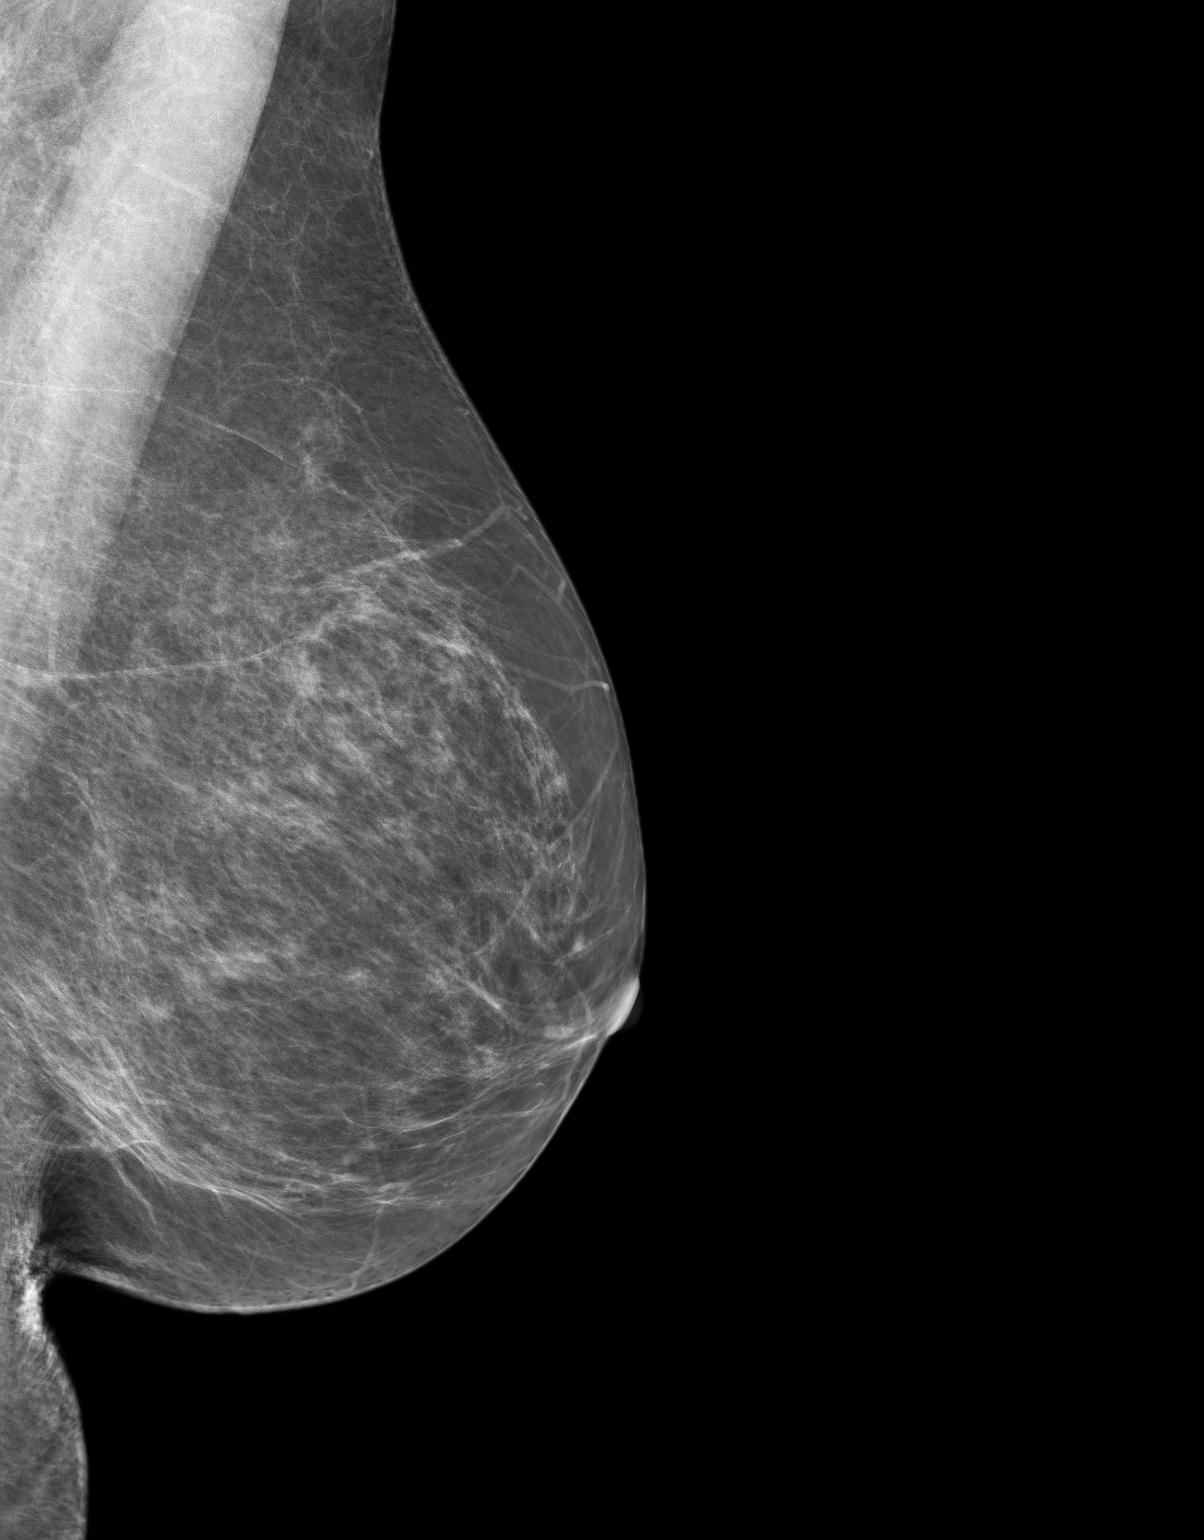

[2 of 2 positions shown; findings below may reference images not displayed]

ACR Breast Density Category b: There are scattered areas of
fibroglandular density.
FINDINGS: The patient has had a right mastectomy. There are no findings
suspicious for malignancy.

Images were processed with CAD.
IMPRESSION: No mammographic evidence of malignancy. A result letter of this
screening mammogram will be mailed directly to the patient.

RECOMMENDATION:
Screening mammogram in one year.  ([id number])

BI-RADS CATEGORY  1: Negative.

## 2015-09-04 DIAGNOSIS — H401133 Primary open-angle glaucoma, bilateral, severe stage: Secondary | ICD-10-CM | POA: Diagnosis not present

## 2015-09-11 ENCOUNTER — Ambulatory Visit (INDEPENDENT_AMBULATORY_CARE_PROVIDER_SITE_OTHER): Payer: Medicare Other

## 2015-09-11 DIAGNOSIS — E538 Deficiency of other specified B group vitamins: Secondary | ICD-10-CM | POA: Diagnosis not present

## 2015-09-11 MED ORDER — CYANOCOBALAMIN 1000 MCG/ML IJ SOLN
1000.0000 ug | Freq: Once | INTRAMUSCULAR | Status: AC
  Administered 2015-09-11: 1000 ug via INTRAMUSCULAR

## 2015-09-11 NOTE — Progress Notes (Signed)
Patient came in for b12 injection.  Received in Left deltoid.  Patient tolerated well  

## 2015-09-24 ENCOUNTER — Telehealth: Payer: Self-pay | Admitting: Internal Medicine

## 2015-09-24 ENCOUNTER — Telehealth: Payer: Self-pay | Admitting: *Deleted

## 2015-09-24 ENCOUNTER — Encounter: Payer: Self-pay | Admitting: Internal Medicine

## 2015-09-24 ENCOUNTER — Other Ambulatory Visit (INDEPENDENT_AMBULATORY_CARE_PROVIDER_SITE_OTHER): Payer: Medicare Other

## 2015-09-24 DIAGNOSIS — E119 Type 2 diabetes mellitus without complications: Secondary | ICD-10-CM | POA: Diagnosis not present

## 2015-09-24 DIAGNOSIS — E538 Deficiency of other specified B group vitamins: Secondary | ICD-10-CM

## 2015-09-24 DIAGNOSIS — E039 Hypothyroidism, unspecified: Secondary | ICD-10-CM

## 2015-09-24 DIAGNOSIS — E7800 Pure hypercholesterolemia, unspecified: Secondary | ICD-10-CM | POA: Diagnosis not present

## 2015-09-24 LAB — LIPID PANEL
CHOLESTEROL: 198 mg/dL (ref 0–200)
HDL: 65.4 mg/dL (ref >39.00)
LDL Cholesterol: 117 mg/dL — ABNORMAL HIGH (ref 0–99)
NonHDL: 132.57
Total CHOL/HDL Ratio: 3
Triglycerides: 76 mg/dL (ref 0.0–149.0)
VLDL: 15.2 mg/dL (ref 0.0–40.0)

## 2015-09-24 LAB — BASIC METABOLIC PANEL
BUN: 17 mg/dL (ref 6–23)
CALCIUM: 9.7 mg/dL (ref 8.4–10.5)
CO2: 30 mEq/L (ref 19–32)
Chloride: 105 mEq/L (ref 96–112)
Creatinine, Ser: 0.98 mg/dL (ref 0.40–1.20)
GFR: 57.47 mL/min — AB (ref >60.00)
GLUCOSE: 112 mg/dL — AB (ref 70–99)
POTASSIUM: 4.7 meq/L (ref 3.5–5.1)
Sodium: 141 mEq/L (ref 135–145)

## 2015-09-24 LAB — HEMOGLOBIN A1C: Hgb A1c MFr Bld: 6.7 % — ABNORMAL HIGH (ref 4.6–6.5)

## 2015-09-24 LAB — HEPATIC FUNCTION PANEL
ALK PHOS: 50 U/L (ref 39–117)
ALT: 11 U/L (ref 0–35)
AST: 13 U/L (ref 0–37)
Albumin: 4.4 g/dL (ref 3.5–5.2)
BILIRUBIN TOTAL: 0.6 mg/dL (ref 0.2–1.2)
Bilirubin, Direct: 0.1 mg/dL (ref 0.0–0.3)
Total Protein: 7.3 g/dL (ref 6.0–8.3)

## 2015-09-24 LAB — TSH: TSH: 2.88 u[IU]/mL (ref 0.35–4.50)

## 2015-09-24 LAB — VITAMIN B12: Vitamin B-12: 535 pg/mL (ref 211–911)

## 2015-09-24 MED ORDER — GLUCOSE BLOOD VI STRP: ORAL_STRIP | Status: DC

## 2015-09-24 NOTE — Telephone Encounter (Signed)
Refill request was completed and sent to pharmacy.

## 2015-09-24 NOTE — Telephone Encounter (Signed)
Patient requested her test strips refilled [place]

## 2015-09-25 ENCOUNTER — Encounter: Payer: Self-pay | Admitting: Internal Medicine

## 2015-09-26 ENCOUNTER — Other Ambulatory Visit: Payer: Self-pay | Admitting: Internal Medicine

## 2015-09-26 DIAGNOSIS — Z1231 Encounter for screening mammogram for malignant neoplasm of breast: Secondary | ICD-10-CM

## 2015-09-26 MED ORDER — GLUCOSE BLOOD VI STRP: ORAL_STRIP | Status: DC

## 2015-09-26 NOTE — Telephone Encounter (Signed)
Pt called about wanting the test strips to go to her regular pharmacy.   Pharmacy is [facility] [place], [place].  Call pt @ [phone]. Thank you!

## 2015-09-26 NOTE — Telephone Encounter (Signed)
Rx sent.  Attempted to call patient but line was busy.

## 2015-09-26 NOTE — Addendum Note (Signed)
Addended by: Westley Hummer B on: 09/26/2015 08:17 AM   Modules accepted: Orders

## 2015-10-01 ENCOUNTER — Ambulatory Visit (INDEPENDENT_AMBULATORY_CARE_PROVIDER_SITE_OTHER): Payer: Medicare Other | Admitting: Internal Medicine

## 2015-10-01 ENCOUNTER — Encounter: Payer: Self-pay | Admitting: Internal Medicine

## 2015-10-01 VITALS — BP 120/70 | HR 63 | Temp 98.1°F | Resp 18 | Ht 65.0 in | Wt 163.5 lb

## 2015-10-01 DIAGNOSIS — E7800 Pure hypercholesterolemia, unspecified: Secondary | ICD-10-CM

## 2015-10-01 DIAGNOSIS — R208 Other disturbances of skin sensation: Secondary | ICD-10-CM | POA: Diagnosis not present

## 2015-10-01 DIAGNOSIS — I10 Essential (primary) hypertension: Secondary | ICD-10-CM

## 2015-10-01 DIAGNOSIS — Z0000 Encounter for general adult medical examination without abnormal findings: Secondary | ICD-10-CM | POA: Diagnosis not present

## 2015-10-01 DIAGNOSIS — E119 Type 2 diabetes mellitus without complications: Secondary | ICD-10-CM

## 2015-10-01 DIAGNOSIS — R1310 Dysphagia, unspecified: Secondary | ICD-10-CM

## 2015-10-01 DIAGNOSIS — Z91048 Other nonmedicinal substance allergy status: Secondary | ICD-10-CM

## 2015-10-01 DIAGNOSIS — M25512 Pain in left shoulder: Secondary | ICD-10-CM

## 2015-10-01 DIAGNOSIS — Z853 Personal history of malignant neoplasm of breast: Secondary | ICD-10-CM

## 2015-10-01 DIAGNOSIS — Z9109 Other allergy status, other than to drugs and biological substances: Secondary | ICD-10-CM

## 2015-10-01 DIAGNOSIS — E039 Hypothyroidism, unspecified: Secondary | ICD-10-CM

## 2015-10-01 DIAGNOSIS — G6289 Other specified polyneuropathies: Secondary | ICD-10-CM

## 2015-10-01 DIAGNOSIS — R200 Anesthesia of skin: Secondary | ICD-10-CM

## 2015-10-01 DIAGNOSIS — H409 Unspecified glaucoma: Secondary | ICD-10-CM

## 2015-10-01 NOTE — Assessment & Plan Note (Signed)
Physical today 10/01/15.  Mammogram scheduled for 10/2015.  Colonoscopy 08/20/09.  Bone density 07/31/14 - improved.

## 2015-10-01 NOTE — Assessment & Plan Note (Signed)
On thyroid replacement.  Follow tsh.  

## 2015-10-01 NOTE — Assessment & Plan Note (Signed)
Mammogram 11/07/14 - Birads I.  Schedule f/u mammogram.

## 2015-10-01 NOTE — Assessment & Plan Note (Signed)
Persistent.  Notices more with full extension of left arm.  Some limited rom.  Discussed further evaluation.  She declines.  Discussed stretches.  Follow.

## 2015-10-01 NOTE — Assessment & Plan Note (Signed)
On gabapentin.  Stable.  Follow.

## 2015-10-01 NOTE — Assessment & Plan Note (Signed)
Appears to be c/w carpal tunnel.  Cock up wrist splint.  Declines further evaluation.  Follow.

## 2015-10-01 NOTE — Progress Notes (Signed)
Patient ID: Julie Hoover, female   DOB: Oct 21, 1929, 82 y.o.   MRN: [id number]   Subjective:    Patient ID: Julie Hoover, female    DOB: [birth year], 82 y.o.   MRN: [id number]  HPI  Patient with past history of hypercholesterolemia, diabetes, hypertension and GERD.  She comes in today for a scheduled follow up.  States her left shoulder is bothering her.  Some limited rom with full extension of her arm.  Some neck discomfort with rotation of her head.  No chest pain. No sob.  No acid reflux.  She does report dysphagia.  States notices when swallowing her calcium tablet.  Also has stopped eating chicken sandwiches because of the bread.  Hard to swallow.  No nausea or vomiting.  Bowels stable.  Having some allergy issues.  No abdominal pain or cramping.  Bowels stable.     Past Medical History  Diagnosis Date  . Hypertension   . Hypercholesterolemia   . Hyperglycemia   . Hypothyroidism   . Diverticulosis   . Environmental allergies   . Arthritis   . GERD (gastroesophageal reflux disease)   . Glaucoma   . Colon polyps   . Chicken pox   . Breast cancer ([place]) 1986    s/p right lumpectomy, XRT and Tamoxifen  . Cancer ([place])     skin ca  . Urethral stricture   . Osteopenia   . Peripheral neuropathy ([place])   . Diabetes [facility])    Past Surgical History  Procedure Laterality Date  . Breast lumpectomy  1986    right  . Tubal ligation    . Urethral dilation    . Knee surgery  06/2008    torn meniscus, right  . Knee surgery  2012    torn menisucs, right  . Breast biopsy Right 6/86    lumpectomy-positive/rad  . Mastectomy  1993    right, recurrence   Family History  Problem Relation Age of Onset  . Heart disease Father   . Hypertension Father   . COPD Mother   . Hypertension Brother     x2  . Hypercholesterolemia Brother     x2  . Hypertension Sister   . Hypercholesterolemia Sister   . Diabetes Mellitus II Sister   . Cancer      mouth, aunt  . Colon cancer  Neg Hx    Social History   Social History  . Marital Status: Widowed    Spouse Name: N/A  . Number of Children: 2  . Years of Education: N/A   Social History Main Topics  . Smoking status: Never Smoker   . Smokeless tobacco: Never Used  . Alcohol Use: No  . Drug Use: No  . Sexual Activity: Not Asked   Other Topics Concern  . None   Social History Narrative    Outpatient Encounter Prescriptions as of 10/01/2015  Medication Sig  . amLODipine (NORVASC) 5 MG tablet TAKE 1 BY MOUTH DAILY  . aspirin 81 MG tablet Take 81 mg by mouth daily.  . Blood Glucose Monitoring Suppl (ONETOUCH VERIO IQ SYSTEM) W/DEVICE KIT Use as directed Dx: E11.9 (Patient taking differently: Check sugars twice a day, twice a week Dx: E11.9)  . Calcium Carbonate-Vitamin D (CALCIUM 600+D) 600-400 MG-UNIT per tablet Take 1 tablet by mouth 3 (three) times daily.   . cetirizine (ZYRTEC) 10 MG tablet Take 10 mg by mouth daily.  . Cyanocobalamin (VITAMIN B-12 IJ) Inject as directed once. monthly  .  desoximetasone (TOPICORT) 0.05 % cream   . dorzolamide-timolol (COSOPT) 22.3-6.8 MG/ML ophthalmic solution Place 2 drops into the left eye 2 (two) times daily.   Marland Kitchen gabapentin (NEURONTIN) 100 MG capsule TAKE 1 BY MOUTH IN THE MORNING AND 2 AT BEDTIME  . glucose blood (ONETOUCH VERIO) test strip Check sugar daily Dx: E11.9  . Lancets 30G MISC One touch delica; to check blood sugars twice daily; E11.9  . latanoprost (XALATAN) 0.005 % ophthalmic solution Place 1 drop into the left eye at bedtime.  Marland Kitchen levothyroxine (SYNTHROID, LEVOTHROID) 50 MCG tablet Take 1 tablet (50 mcg total) by mouth daily.  Marland Kitchen omeprazole (PRILOSEC) 40 MG capsule TAKE 1 BY MOUTH DAILY  . simvastatin (ZOCOR) 10 MG tablet TAKE 1 BY MOUTH DAILY  . temazepam (RESTORIL) 30 MG capsule Take 1 capsule (30 mg total) by mouth at bedtime as needed.  . zoledronic acid (RECLAST) 5 MG/100ML SOLN Inject 5 mg into the vein once.   No facility-administered encounter  medications on file as of 10/01/2015.    Review of Systems  Constitutional: Negative for appetite change and unexpected weight change.  HENT: Positive for congestion (nasal congestion.  ) and postnasal drip. Negative for sinus pressure.   Eyes: Negative for pain and visual disturbance.  Respiratory: Negative for cough, chest tightness and shortness of breath.   Cardiovascular: Negative for chest pain, palpitations and leg swelling.  Gastrointestinal: Negative for nausea, vomiting, abdominal pain and diarrhea.       Dysphagia.   Genitourinary: Negative for dysuria and difficulty urinating.  Musculoskeletal: Negative for back pain and joint swelling.       Left shoulder pain and limited rom as outlined.    Skin: Negative for color change and rash.  Neurological: Negative for dizziness, light-headedness and headaches.       Left hand numbness.    Hematological: Negative for adenopathy. Does not bruise/bleed easily.  Psychiatric/Behavioral: Negative for dysphoric mood and agitation.       Objective:     Blood pressure rechecked by me:  128/78  Physical Exam  Constitutional: She is oriented to person, place, and time. She appears well-developed and well-nourished. No distress.  HENT:  Nose: Nose normal.  Mouth/Throat: Oropharynx is clear and moist.  Eyes: Right eye exhibits no discharge. Left eye exhibits no discharge. No scleral icterus.  Neck: Neck supple. No thyromegaly present.  Cardiovascular: Normal rate and regular rhythm.   Pulmonary/Chest: Breath sounds normal. No accessory muscle usage. No tachypnea. No respiratory distress. She has no decreased breath sounds. She has no wheezes. She has no rhonchi. Right breast exhibits no inverted nipple, no mass, no nipple discharge and no tenderness (no axillary adenopathy). Left breast exhibits no inverted nipple, no mass, no nipple discharge and no tenderness (no axilarry adenopathy).  Abdominal: Soft. Bowel sounds are normal. There is no  tenderness.  Musculoskeletal: She exhibits no edema or tenderness.  Positive phalens - left.  Increased discomfort in left shoulder with attempts at full extension of her left arm.   Lymphadenopathy:    She has no cervical adenopathy.  Neurological: She is alert and oriented to person, place, and time.  Skin: Skin is warm. No rash noted. No erythema.  Psychiatric: She has a normal mood and affect. Her behavior is normal.    BP 120/70 mmHg  Pulse 63  Temp(Src) 98.1 F (36.7 C) (Oral)  Resp 18  Ht '5\' 5"'$  (1.651 m)  Wt 163 lb 8 oz (74.163 kg)  BMI 27.21 kg/m2  SpO2 95% Wt Readings from Last 3 Encounters:  10/01/15 163 lb 8 oz (74.163 kg)  05/28/15 161 lb 6 oz (73.199 kg)  01/29/15 161 lb 8 oz (73.256 kg)     Lab Results  Component Value Date   WBC 9.2 05/21/2015   HGB 14.0 05/21/2015   HCT 43.2 05/21/2015   PLT 256.0 05/21/2015   GLUCOSE 112* 09/24/2015   CHOL 198 09/24/2015   TRIG 76.0 09/24/2015   HDL 65.40 09/24/2015   LDLCALC 117* 09/24/2015   ALT 11 09/24/2015   AST 13 09/24/2015   NA 141 09/24/2015   K 4.7 09/24/2015   CL 105 09/24/2015   CREATININE 0.98 09/24/2015   BUN 17 09/24/2015   CO2 30 09/24/2015   TSH 2.88 09/24/2015   HGBA1C 6.7* 09/24/2015   MICROALBUR <0.7 01/23/2015    Mm Digital Screening Unilat L  11/07/2014  CLINICAL DATA:  Screening. EXAM: DIGITAL SCREENING UNILATERAL LEFT MAMMOGRAM WITH CAD COMPARISON:  Previous exam(s). ACR Breast Density Category b: There are scattered areas of fibroglandular density. FINDINGS: The patient has had a right mastectomy. There are no findings suspicious for malignancy. Images were processed with CAD. IMPRESSION: No mammographic evidence of malignancy. A result letter of this screening mammogram will be mailed directly to the patient. RECOMMENDATION: Screening mammogram in one year.  (SM-L-15M) BI-RADS CATEGORY  1: Negative. Electronically Signed   By: Pamelia Hoit M.D.   On: 11/07/2014 16:38       Assessment &  Plan:   Problem List Items Addressed This Visit    Diabetes ([place])    a1c just checked 6.7.  Low carb diet and exercise.  Follow met b and a1c.        Relevant Orders   Hemoglobin B9U   Basic metabolic panel   Environmental allergies    Antihistamine - continue.  Add saline nasal spray and nasacort as directed.  Follow.       Glaucoma    Followed by opthalmology.        Health care maintenance    Physical today 10/01/15.  Mammogram scheduled for 10/2015.  Colonoscopy 08/20/09.  Bone density 07/31/14 - improved.       History of breast cancer    Mammogram 11/07/14 - Birads I.  Schedule f/u mammogram.        Hypercholesterolemia    On simvastatin.  Low cholesterol diet and exercise.  Follow lipid panel and liver function tests.   Lab Results  Component Value Date   CHOL 198 09/24/2015   HDL 65.40 09/24/2015   LDLCALC 117* 09/24/2015   TRIG 76.0 09/24/2015   CHOLHDL 3 09/24/2015        Relevant Orders   Lipid panel   Hepatic function panel   Hypertension    Blood pressure under good control.  Continue same medication regimen.  Follow pressures.  Follow metabolic panel.        Hypothyroidism    On thyroid replacement.  Follow tsh.       Left shoulder pain    Persistent.  Notices more with full extension of left arm.  Some limited rom.  Discussed further evaluation.  She declines.  Discussed stretches.  Follow.        Numbness of left hand    Appears to be c/w carpal tunnel.  Cock up wrist splint.  Declines further evaluation.  Follow.       Peripheral neuropathy ([facility])    On gabapentin.  Stable.  Follow.  Other Visit Diagnoses    Dysphagia    -  Primary    Relevant Orders    Ambulatory referral to Gastroenterology        Einar Pheasant, MD

## 2015-10-01 NOTE — Assessment & Plan Note (Signed)
Followed by opthalmology.       

## 2015-10-01 NOTE — Assessment & Plan Note (Signed)
Blood pressure under good control.  Continue same medication regimen.  Follow pressures.  Follow metabolic panel.   

## 2015-10-01 NOTE — Assessment & Plan Note (Signed)
On simvastatin.  Low cholesterol diet and exercise.  Follow lipid panel and liver function tests.   Lab Results  Component Value Date   CHOL 198 09/24/2015   HDL 65.40 09/24/2015   LDLCALC 117* 09/24/2015   TRIG 76.0 09/24/2015   CHOLHDL 3 09/24/2015

## 2015-10-01 NOTE — Progress Notes (Signed)
Pre-visit discussion using our clinic review tool. No additional management support is needed unless otherwise documented below in the visit note.  

## 2015-10-01 NOTE — Assessment & Plan Note (Signed)
Antihistamine - continue.  Add saline nasal spray and nasacort as directed.  Follow.

## 2015-10-01 NOTE — Assessment & Plan Note (Signed)
a1c just checked 6.7.  Low carb diet and exercise.  Follow met b and a1c.

## 2015-10-01 NOTE — Patient Instructions (Signed)
Saline nasal spray - flush nose at least 2-3x/day  nasacort (or flonase) nasal spray - 2 sprays each nostril one time per day.  Do this in the evening.

## 2015-10-02 ENCOUNTER — Other Ambulatory Visit: Payer: Self-pay | Admitting: Internal Medicine

## 2015-10-16 ENCOUNTER — Ambulatory Visit (INDEPENDENT_AMBULATORY_CARE_PROVIDER_SITE_OTHER): Payer: Medicare Other

## 2015-10-16 DIAGNOSIS — E538 Deficiency of other specified B group vitamins: Secondary | ICD-10-CM

## 2015-10-16 MED ORDER — CYANOCOBALAMIN 1000 MCG/ML IJ SOLN
1000.0000 ug | Freq: Once | INTRAMUSCULAR | Status: AC
  Administered 2015-10-16: 1000 ug via INTRAMUSCULAR

## 2015-10-16 NOTE — Progress Notes (Signed)
Patient was in today receiving a B12 injection in the right deltoid. Patient tolerated well.

## 2015-10-29 ENCOUNTER — Ambulatory Visit (INDEPENDENT_AMBULATORY_CARE_PROVIDER_SITE_OTHER): Payer: Medicare Other

## 2015-10-29 VITALS — BP 128/80 | HR 67 | Temp 98.2°F | Resp 14 | Ht 64.0 in | Wt 164.0 lb

## 2015-10-29 DIAGNOSIS — Z0000 Encounter for general adult medical examination without abnormal findings: Secondary | ICD-10-CM | POA: Diagnosis not present

## 2015-10-29 NOTE — Progress Notes (Signed)
Subjective:   Julie Hoover is a 82 y.o. female who presents for an Initial Medicare Annual Wellness Visit.  Review of Systems    No ROS.  Medicare Wellness Visit.  Cardiac Risk Factors include: advanced age (>41mn, >>65women);diabetes mellitus;hypertension     Objective:    Today's Vitals   10/29/15 0930  BP: 128/80  Pulse: 67  Temp: 98.2 F (36.8 C)  TempSrc: Oral  Resp: 14  Height: 5' 4" (1.626 m)  Weight: 164 lb (74.39 kg)  SpO2: 97%   Body mass index is 28.14 kg/(m^2).   Current Medications (verified) Outpatient Encounter Prescriptions as of 10/29/2015  Medication Sig  . amLODipine (NORVASC) 5 MG tablet TAKE 1 BY MOUTH DAILY  . aspirin 81 MG tablet Take 81 mg by mouth daily.  . Blood Glucose Monitoring Suppl (ONETOUCH VERIO IQ SYSTEM) W/DEVICE KIT Use as directed Dx: E11.9 (Patient taking differently: Check sugars twice a day, twice a week Dx: E11.9)  . Calcium Carbonate-Vitamin D (CALCIUM 600+D) 600-400 MG-UNIT per tablet Take 1 tablet by mouth 3 (three) times daily.   . cetirizine (ZYRTEC) 10 MG tablet Take 10 mg by mouth daily.  . Cyanocobalamin (VITAMIN B-12 IJ) Inject as directed once. monthly  . desoximetasone (TOPICORT) 0.05 % cream   . dorzolamide-timolol (COSOPT) 22.3-6.8 MG/ML ophthalmic solution Place 2 drops into the left eye 2 (two) times daily.   .Marland Kitchengabapentin (NEURONTIN) 100 MG capsule TAKE 1 CAPSULE EVERY MORNING  AND TAKE 2 CAPSULES AT BEDTIME  . glucose blood (ONETOUCH VERIO) test strip Check sugar daily Dx: E11.9  . Lancets 30G MISC One touch delica; to check blood sugars twice daily; E11.9  . latanoprost (XALATAN) 0.005 % ophthalmic solution Place 1 drop into the left eye at bedtime.  .Marland Kitchenlevothyroxine (SYNTHROID, LEVOTHROID) 50 MCG tablet Take 1 tablet (50 mcg total) by mouth daily.  .Marland Kitchenomeprazole (PRILOSEC) 40 MG capsule TAKE 1 BY MOUTH DAILY  . simvastatin (ZOCOR) 10 MG tablet TAKE 1 BY MOUTH DAILY  . temazepam (RESTORIL) 30 MG capsule  Take 1 capsule (30 mg total) by mouth at bedtime as needed.  . zoledronic acid (RECLAST) 5 MG/100ML SOLN Inject 5 mg into the vein once.   No facility-administered encounter medications on file as of 10/29/2015.    Allergies (verified) Iodinated diagnostic agents; Ciprofloxacin; Gentian root; Halothane; Hydrocodone; Norco; and Sulfa antibiotics   History: Past Medical History  Diagnosis Date  . Hypertension   . Hypercholesterolemia   . Hyperglycemia   . Hypothyroidism   . Diverticulosis   . Environmental allergies   . Arthritis   . GERD (gastroesophageal reflux disease)   . Glaucoma   . Colon polyps   . Chicken pox   . [place]    s/p right lumpectomy, XRT and Tamoxifen  . [place]     skin ca  . Urethral stricture   . Osteopenia   . [place]   . Diabetes ([facility]    Past Surgical History  Procedure Laterality Date  . Breast lumpectomy  1986    right  . Tubal ligation    . Urethral dilation    . Knee surgery  06/2008    torn meniscus, right  . Knee surgery  2012    torn menisucs, right  . Breast biopsy Right 6/86    lumpectomy-positive/rad  . Mastectomy  1993    right, recurrence  . Eye surgery      Bilateral cataracts removed  Family History  Problem Relation Age of Onset  . Heart disease Father   . Hypertension Father   . COPD Mother   . Hypertension Brother     x2  . Hypercholesterolemia Brother     x2  . Hypertension Sister   . Hypercholesterolemia Sister   . Diabetes Mellitus II Sister   . Cancer      mouth, aunt  . Colon cancer Neg Hx    Social History   Occupational History  . Not on file.   Social History Main Topics  . Smoking status: Never Smoker   . Smokeless tobacco: Never Used  . Alcohol Use: No  . Drug Use: No  . Sexual Activity: No    Tobacco Counseling Counseling given: Not Answered   Activities of Daily Living In your present state of health, do you have any difficulty performing the  following activities: 10/29/2015  Hearing? N  Vision? N  Difficulty concentrating or making decisions? N  Walking or climbing stairs? Y  Dressing or bathing? N  Doing errands, shopping? N  Preparing Food and eating ? N  Using the Toilet? N  In the past six months, have you accidently leaked urine? N  Do you have problems with loss of bowel control? N  Managing your Medications? N  Managing your Finances? N  Housekeeping or managing your Housekeeping? N    Immunizations and Health Maintenance Immunization History  Administered Date(s) Administered  . Influenza Split 01/18/2012  . Influenza,inj,Quad PF,36+ Mos 01/12/2013, 12/14/2013, 01/09/2015  . Pneumococcal Conjugate-13 05/17/2013  . Pneumococcal Polysaccharide-23 01/29/2015  . Tdap 01/17/2013  . Zoster 03/26/2006   There are no preventive care reminders to display for this patient.  Patient Care Team: Einar Pheasant, MD as PCP - General (Internal Medicine)  Indicate any recent Medical Services you may have received from other than [facility] providers in the past year (date may be approximate).     Assessment:   This is a routine wellness examination for [facility].  The goal of the wellness visit is to assist the patient how to close the gaps in care and create a preventative care plan for the patient.   Taking calcium VIT D as appropriate/Osteoporosis risk reviewed.  Medications reviewed; taking without issues or barriers.  Safety issues reviewed; smoke and carbon monoxide detectors in the home. Firearms locked in a secured area in the home. Wears seatbelts when driving or riding with others. No violence in the home.  No identified risk were noted; The patient was oriented x 3; appropriate in dress and manner and no objective failures at ADL's or IADL's.   Overweight, Pre- Obese; discussed the importance of a healthy diet, water intake and exercise. Educational material provided.  Malignant neoplasm breast NOS-stable and  followed by PCP.  Health maintenance gaps; closed.  Patient Concerns: L shoulder chronic pain present with movement of arms over head.  States she will follow up with PCP if condition worsens and unable to tolerate prior to next follow up with PCP.  Hearing/Vision screen Hearing Screening Comments: C/O of difficulty hearing at times. Passed the whisper test. Audiology deferred. Vision Screening Comments: Followed by [facility], Dr. Cephus Shelling, Shari Prows Wears reading glasses only Bilateral cataracts removed L eye shunt placed Quarterly visits  Dietary issues and exercise activities discussed: Current Exercise Habits: The patient does not participate in regular exercise at present  Goals    . Healthy Lifestyle     Stay hydrated and drink plenty of fluids.  Low carb foods. Continue making healthy choices when dining out.  Educational material provided. Lean meats (chicken, [place], fish). Vegetables, fruits. Stay active and start exercising again on the treadmill.  Walk 5 minutes in the morning, 5 minutes in the evening, increase as tolerated.      Depression Screen PHQ 2/9 Scores 10/29/2015 10/01/2015 09/27/2014 01/24/2014 09/20/2012 06/27/2012  PHQ - 2 Score 0 0 0 1 0 0    Fall Risk Fall Risk  10/29/2015 10/01/2015 09/27/2014 01/24/2014 09/20/2012  Falls in the past year? _0     Cognitive Function: MMSE - Mini Mental State Exam 10/29/2015  Orientation to time 5  Orientation to Place 5  Registration 3  Attention/ Calculation 5  Recall 3  Language- name 2 objects 2  Language- repeat 1  Language- follow 3 step command 3  Language- read & follow direction 1  Write a sentence 1  Copy design 1  Total score 30    Screening Tests Health Maintenance  Topic Date Due  . MAMMOGRAM  11/07/2015  . INFLUENZA VACCINE  11/20/2015  . OPHTHALMOLOGY EXAM  11/29/2015  . URINE MICROALBUMIN  01/23/2016  . FOOT EXAM  01/29/2016  . HEMOGLOBIN A1C  03/25/2016  . TETANUS/TDAP  01/18/2023    . DEXA SCAN  Completed  . ZOSTAVAX  Completed  . PNA vac Low Risk Adult  Completed      Plan:   End of life planning; Advance aging; Advanced directives discussed. Copy of current HCPOA/Living Will requested.   During the course of the visit, Ayrabella was educated and counseled about the following appropriate screening and preventive services:   Vaccines to include Pneumoccal, Influenza, Hepatitis B, Td, Zostavax, HCV  Electrocardiogram  Cardiovascular disease screening  Colorectal cancer screening  Bone density screening  Diabetes screening  Glaucoma screening  Mammography/PAP  Nutrition counseling  Smoking cessation counseling  Patient Instructions (the written plan) were given to the patient.    Varney Biles, LPN   [id number]    Reviewed above.  Agree with assessment and plan.  Agree with f/u appt with me.    Dr Nicki Reaper

## 2015-10-29 NOTE — Patient Instructions (Addendum)
Julie Hoover , Thank you for taking time to come for your Medicare Wellness Visit. I appreciate your ongoing commitment to your health goals. Please review the following plan we discussed and let me know if I can assist you in the future.   Follow up with Dr. Nicki Reaper as needed.   This is a list of the screening recommended for you and due dates:  Health Maintenance  Topic Date Due  . Mammogram  11/07/2015  . Flu Shot  11/20/2015  . Eye exam for diabetics  11/29/2015  . Urine Protein Check  01/23/2016  . Complete foot exam   01/29/2016  . Hemoglobin A1C  03/25/2016  . Tetanus Vaccine  01/18/2023  . DEXA scan (bone density measurement)  Completed  . Shingles Vaccine  Completed  . Pneumonia vaccines  Completed    Hearing Loss Hearing loss is a partial or total loss of the ability to hear. This can be temporary or permanent, and it can happen in one or both ears. Hearing loss may be referred to as deafness. Medical care is necessary to treat hearing loss properly and to prevent the condition from getting worse. Your hearing may partially or completely come back, depending on what caused your hearing loss and how severe it is. In some cases, hearing loss is permanent. CAUSES Common causes of hearing loss include:   Too much wax in the ear canal.   Infection of the ear canal or middle ear.   Fluid in the middle ear.   Injury to the ear or surrounding area.   An object stuck in the ear.   Prolonged exposure to loud sounds, such as music.  Less common causes of hearing loss include:   Tumors in the ear.   Viral or bacterial infections, such as meningitis.   A hole in the eardrum (perforated eardrum).  Problems with the hearing nerve that sends signals between the brain and the ear.  Certain medicines.  SYMPTOMS  Symptoms of this condition may include:  Difficulty telling the difference between sounds.  Difficulty following a conversation when there is background  noise.  Lack of response to sounds in your environment. This may be most noticeable when you do not respond to startling sounds.  Needing to turn up the volume on the television, radio, etc.  Ringing in the ears.  Dizziness.  Pain in the ears. DIAGNOSIS This condition is diagnosed based on a physical exam and a hearing test (audiometry). The audiometry test will be performed by a hearing specialist (audiologist). You may also be referred to an ear, nose, and throat (ENT) specialist (otolaryngologist).  TREATMENT Treatment for recent onset of hearing loss may include:   Ear wax removal.   Being prescribed medicines to prevent infection (antibiotics).   Being prescribed medicines to reduce inflammation (corticosteroids).  HOME CARE INSTRUCTIONS  If you were prescribed an antibiotic medicine, take it as told by your health care provider. Do not stop taking the antibiotic even if you start to feel better.  Take over-the-counter and prescription medicines only as told by your health care provider.  Avoid loud noises.   Return to your normal activities as told by your health care provider. Ask your health care provider what activities are safe for you.  Keep all follow-up visits as told by your health care provider. This is important. SEEK MEDICAL CARE IF:   You feel dizzy.   You develop new symptoms.   You vomit or feel nauseous.   You  have a fever.  SEEK IMMEDIATE MEDICAL CARE IF:  You develop sudden changes in your vision.   You have severe ear pain.   You have new or increased weakness.  You have a severe headache.   This information is not intended to replace advice given to you by your health care provider. Make sure you discuss any questions you have with your health care provider.   Document Released: 04/07/2005 Document Revised: 12/27/2014 Document Reviewed: 08/23/2014 Elsevier Interactive Patient Education [employer].

## 2015-10-30 ENCOUNTER — Other Ambulatory Visit: Payer: Self-pay | Admitting: Nurse Practitioner

## 2015-10-30 DIAGNOSIS — R1319 Other dysphagia: Secondary | ICD-10-CM | POA: Diagnosis not present

## 2015-10-30 DIAGNOSIS — K219 Gastro-esophageal reflux disease without esophagitis: Secondary | ICD-10-CM | POA: Diagnosis not present

## 2015-10-30 DIAGNOSIS — R1310 Dysphagia, unspecified: Secondary | ICD-10-CM

## 2015-11-01 ENCOUNTER — Ambulatory Visit
Admission: RE | Admit: 2015-11-01 | Discharge: 2015-11-01 | Disposition: A | Discharge status: Home / Self Care | Payer: Medicare Other | Source: Ambulatory Visit | Attending: Nurse Practitioner | Admitting: Nurse Practitioner

## 2015-11-01 DIAGNOSIS — K449 Diaphragmatic hernia without obstruction or gangrene: Secondary | ICD-10-CM | POA: Diagnosis not present

## 2015-11-01 DIAGNOSIS — K219 Gastro-esophageal reflux disease without esophagitis: Secondary | ICD-10-CM | POA: Insufficient documentation

## 2015-11-01 DIAGNOSIS — R1310 Dysphagia, unspecified: Secondary | ICD-10-CM

## 2015-11-01 DIAGNOSIS — R1319 Other dysphagia: Secondary | ICD-10-CM | POA: Diagnosis not present

## 2015-11-01 DIAGNOSIS — K228 Other specified diseases of esophagus: Secondary | ICD-10-CM | POA: Diagnosis not present

## 2015-11-01 DIAGNOSIS — K225 Diverticulum of esophagus, acquired: Secondary | ICD-10-CM | POA: Insufficient documentation

## 2015-11-12 ENCOUNTER — Other Ambulatory Visit: Payer: Self-pay | Admitting: Internal Medicine

## 2015-11-12 ENCOUNTER — Ambulatory Visit
Admission: RE | Admit: 2015-11-12 | Discharge: 2015-11-12 | Disposition: A | Discharge status: Home / Self Care | Payer: Medicare Other | Source: Ambulatory Visit | Attending: Internal Medicine | Admitting: Internal Medicine

## 2015-11-12 DIAGNOSIS — Z1231 Encounter for screening mammogram for malignant neoplasm of breast: Secondary | ICD-10-CM

## 2015-11-12 LAB — HM MAMMOGRAPHY

## 2015-11-20 ENCOUNTER — Ambulatory Visit (INDEPENDENT_AMBULATORY_CARE_PROVIDER_SITE_OTHER): Payer: Medicare Other

## 2015-11-20 DIAGNOSIS — E538 Deficiency of other specified B group vitamins: Secondary | ICD-10-CM | POA: Diagnosis not present

## 2015-11-20 MED ORDER — CYANOCOBALAMIN 1000 MCG/ML IJ SOLN
1000.0000 ug | Freq: Once | INTRAMUSCULAR | Status: AC
  Administered 2015-11-20: 1000 ug via INTRAMUSCULAR

## 2015-11-20 NOTE — Progress Notes (Signed)
Patient is in today receiving a B12 injection in her left deltoid. Patient tolerated well.

## 2015-12-14 ENCOUNTER — Encounter: Payer: Self-pay | Admitting: *Deleted

## 2015-12-17 ENCOUNTER — Ambulatory Visit: Payer: Medicare Other | Admitting: Anesthesiology

## 2015-12-17 ENCOUNTER — Ambulatory Visit
Admission: RE | Admit: 2015-12-17 | Discharge: 2015-12-17 | Disposition: A | Discharge status: Home / Self Care | Payer: Medicare Other | Source: Ambulatory Visit | Attending: Gastroenterology | Admitting: Gastroenterology

## 2015-12-17 ENCOUNTER — Encounter: Payer: Self-pay | Admitting: *Deleted

## 2015-12-17 ENCOUNTER — Encounter
Admission: RE | Disposition: A | Discharge status: Home / Self Care | Payer: Self-pay | Source: Ambulatory Visit | Attending: Gastroenterology

## 2015-12-17 DIAGNOSIS — E039 Hypothyroidism, unspecified: Secondary | ICD-10-CM | POA: Insufficient documentation

## 2015-12-17 DIAGNOSIS — Z79899 Other long term (current) drug therapy: Secondary | ICD-10-CM | POA: Insufficient documentation

## 2015-12-17 DIAGNOSIS — R1310 Dysphagia, unspecified: Secondary | ICD-10-CM | POA: Diagnosis not present

## 2015-12-17 DIAGNOSIS — M1990 Unspecified osteoarthritis, unspecified site: Secondary | ICD-10-CM | POA: Insufficient documentation

## 2015-12-17 DIAGNOSIS — K224 Dyskinesia of esophagus: Secondary | ICD-10-CM | POA: Insufficient documentation

## 2015-12-17 DIAGNOSIS — E1142 Type 2 diabetes mellitus with diabetic polyneuropathy: Secondary | ICD-10-CM | POA: Insufficient documentation

## 2015-12-17 DIAGNOSIS — Z86010 Personal history of colonic polyps: Secondary | ICD-10-CM | POA: Diagnosis not present

## 2015-12-17 DIAGNOSIS — I10 Essential (primary) hypertension: Secondary | ICD-10-CM | POA: Diagnosis not present

## 2015-12-17 DIAGNOSIS — Z881 Allergy status to other antibiotic agents status: Secondary | ICD-10-CM | POA: Insufficient documentation

## 2015-12-17 DIAGNOSIS — Z91048 Other nonmedicinal substance allergy status: Secondary | ICD-10-CM | POA: Insufficient documentation

## 2015-12-17 DIAGNOSIS — K2970 Gastritis, unspecified, without bleeding: Secondary | ICD-10-CM | POA: Insufficient documentation

## 2015-12-17 DIAGNOSIS — E1139 Type 2 diabetes mellitus with other diabetic ophthalmic complication: Secondary | ICD-10-CM | POA: Diagnosis not present

## 2015-12-17 DIAGNOSIS — Z853 Personal history of malignant neoplasm of breast: Secondary | ICD-10-CM | POA: Insufficient documentation

## 2015-12-17 DIAGNOSIS — K210 Gastro-esophageal reflux disease with esophagitis: Secondary | ICD-10-CM | POA: Diagnosis not present

## 2015-12-17 DIAGNOSIS — K317 Polyp of stomach and duodenum: Secondary | ICD-10-CM | POA: Diagnosis not present

## 2015-12-17 DIAGNOSIS — M8580 Other specified disorders of bone density and structure, unspecified site: Secondary | ICD-10-CM | POA: Insufficient documentation

## 2015-12-17 DIAGNOSIS — Z9011 Acquired absence of right breast and nipple: Secondary | ICD-10-CM | POA: Insufficient documentation

## 2015-12-17 DIAGNOSIS — Z85828 Personal history of other malignant neoplasm of skin: Secondary | ICD-10-CM | POA: Diagnosis not present

## 2015-12-17 DIAGNOSIS — Z888 Allergy status to other drugs, medicaments and biological substances status: Secondary | ICD-10-CM | POA: Insufficient documentation

## 2015-12-17 DIAGNOSIS — D131 Benign neoplasm of stomach: Secondary | ICD-10-CM | POA: Diagnosis not present

## 2015-12-17 DIAGNOSIS — K228 Other specified diseases of esophagus: Secondary | ICD-10-CM | POA: Diagnosis not present

## 2015-12-17 DIAGNOSIS — K219 Gastro-esophageal reflux disease without esophagitis: Secondary | ICD-10-CM | POA: Insufficient documentation

## 2015-12-17 DIAGNOSIS — K2960 Other gastritis without bleeding: Secondary | ICD-10-CM | POA: Diagnosis not present

## 2015-12-17 DIAGNOSIS — H409 Unspecified glaucoma: Secondary | ICD-10-CM | POA: Diagnosis not present

## 2015-12-17 DIAGNOSIS — Z885 Allergy status to narcotic agent status: Secondary | ICD-10-CM | POA: Insufficient documentation

## 2015-12-17 DIAGNOSIS — Z7982 Long term (current) use of aspirin: Secondary | ICD-10-CM | POA: Insufficient documentation

## 2015-12-17 DIAGNOSIS — Z9889 Other specified postprocedural states: Secondary | ICD-10-CM | POA: Insufficient documentation

## 2015-12-17 DIAGNOSIS — K225 Diverticulum of esophagus, acquired: Secondary | ICD-10-CM | POA: Diagnosis not present

## 2015-12-17 DIAGNOSIS — B9681 Helicobacter pylori [H. pylori] as the cause of diseases classified elsewhere: Secondary | ICD-10-CM | POA: Diagnosis not present

## 2015-12-17 DIAGNOSIS — K449 Diaphragmatic hernia without obstruction or gangrene: Secondary | ICD-10-CM | POA: Diagnosis not present

## 2015-12-17 DIAGNOSIS — E7800 Pure hypercholesterolemia, unspecified: Secondary | ICD-10-CM | POA: Insufficient documentation

## 2015-12-17 DIAGNOSIS — Z882 Allergy status to sulfonamides status: Secondary | ICD-10-CM | POA: Insufficient documentation

## 2015-12-17 DIAGNOSIS — Z8619 Personal history of other infectious and parasitic diseases: Secondary | ICD-10-CM | POA: Insufficient documentation

## 2015-12-17 DIAGNOSIS — K2900 Acute gastritis without bleeding: Secondary | ICD-10-CM | POA: Diagnosis not present

## 2015-12-17 DIAGNOSIS — K5790 Diverticulosis of intestine, part unspecified, without perforation or abscess without bleeding: Secondary | ICD-10-CM | POA: Diagnosis not present

## 2015-12-17 HISTORY — PX: ESOPHAGOGASTRODUODENOSCOPY (EGD) WITH PROPOFOL: SHX5813

## 2015-12-17 LAB — GLUCOSE, CAPILLARY: GLUCOSE-CAPILLARY: 109 mg/dL — AB (ref 65–99)

## 2015-12-17 SURGERY — ESOPHAGOGASTRODUODENOSCOPY (EGD) WITH PROPOFOL
Anesthesia: General

## 2015-12-17 MED ORDER — SODIUM CHLORIDE 0.9 % IV SOLN: INTRAVENOUS | Status: DC

## 2015-12-17 MED ORDER — PHENYLEPHRINE HCL 10 MG/ML IJ SOLN
INTRAMUSCULAR | Status: DC | PRN
  Administered 2015-12-17: 100 ug via INTRAVENOUS

## 2015-12-17 MED ORDER — FENTANYL CITRATE (PF) 100 MCG/2ML IJ SOLN
INTRAMUSCULAR | Status: DC | PRN
  Administered 2015-12-17: 50 ug via INTRAVENOUS

## 2015-12-17 MED ORDER — PROPOFOL 10 MG/ML IV BOLUS
INTRAVENOUS | Status: DC | PRN
  Administered 2015-12-17: 80 mg via INTRAVENOUS

## 2015-12-17 MED ORDER — MIDAZOLAM HCL 5 MG/5ML IJ SOLN
INTRAMUSCULAR | Status: DC | PRN
  Administered 2015-12-17: 1 mg via INTRAVENOUS

## 2015-12-17 MED ORDER — PROPOFOL 500 MG/50ML IV EMUL
INTRAVENOUS | Status: DC | PRN
  Administered 2015-12-17: 120 ug/kg/min via INTRAVENOUS

## 2015-12-17 MED ORDER — SODIUM CHLORIDE 0.9 % IV SOLN
INTRAVENOUS | Status: DC
  Administered 2015-12-17 (×2): via INTRAVENOUS

## 2015-12-17 MED ORDER — LIDOCAINE 2% (20 MG/ML) 5 ML SYRINGE
INTRAMUSCULAR | Status: DC | PRN
  Administered 2015-12-17: 30 mg via INTRAVENOUS

## 2015-12-17 NOTE — Transfer of Care (Signed)
Immediate Anesthesia Transfer of Care Note  Patient: Julie Hoover  Procedure(s) Performed: Procedure(s): ESOPHAGOGASTRODUODENOSCOPY (EGD) WITH PROPOFOL (N/A)  Patient Location: PACU and Endoscopy Unit  Anesthesia Type:General  Level of Consciousness: sedated  Airway & Oxygen Therapy: Patient Spontanous Breathing and Patient connected to nasal cannula oxygen  Post-op Assessment: Report given to RN and Post -op Vital signs reviewed and stable  Post vital signs: Reviewed and stable  Last Vitals:  Vitals:   12/17/15 0829  BP: 134/78  Pulse: (!) 58  Resp: 14  Temp: (!) 35.9 C    Last Pain:  Vitals:   12/17/15 0829  TempSrc: Tympanic         Complications: No apparent anesthesia complications

## 2015-12-17 NOTE — H&P (Signed)
Outpatient short stay form Pre-procedure 12/17/2015 9:48 AM Lollie Sails MD  Primary Physician: Dr. Einar Pheasant  Reason for visit:  EGD  History of present illness:  Patient is a 82 year old female presenting today for an EGD. She has been having problems occurring at least for a year of dysphagia with tablets and bread. Tablet seemed to hang in the low cervical region as well as dry bread. He had a barium esophagram done on 11/01/2015 that showed multiple issues. There was a mild cricopharyngeus hypertrophy and there was a pulse in diverticulum noted at the C5 level. In review of the films or actual several small polyps and diverticuli in the distal esophagus as well. Moderate size nonreducible hiatal hernia as well as some cervical range is of minimal anterior osteophytes. Barium tablet moved through the esophagus without hanging up. There are also changes of presbyesophagus with prominent tertiary contractions and limited purposeful peristalsis in the prone position. There was no evidence of fixed stricture or other changes.    Current Facility-Administered Medications:  .  0.9 %  sodium chloride infusion, , Intravenous, Continuous, Lollie Sails, MD, Last Rate: 20 mL/hr at 12/17/15 0904 .  0.9 %  sodium chloride infusion, , Intravenous, Continuous, Lollie Sails, MD  Prescriptions Prior to Admission  Medication Sig Dispense Refill Last Dose  . amLODipine (NORVASC) 5 MG tablet TAKE 1 BY MOUTH DAILY 90 tablet 3 12/16/2015 at Unknown time  . Calcium Carbonate-Vitamin D (CALCIUM 600+D) 600-400 MG-UNIT per tablet Take 1 tablet by mouth 3 (three) times daily.    12/16/2015 at Unknown time  . cetirizine (ZYRTEC) 10 MG tablet Take 10 mg by mouth daily.   12/16/2015 at Unknown time  . Cyanocobalamin (VITAMIN B-12 IJ) Inject as directed once. monthly   Past Month at Unknown time  . dorzolamide-timolol (COSOPT) 22.3-6.8 MG/ML ophthalmic solution Place 2 drops into the left eye 2 (two)  times daily.    Past Week at Unknown time  . latanoprost (XALATAN) 0.005 % ophthalmic solution Place 1 drop into the left eye at bedtime.  5 12/16/2015 at Unknown time  . levothyroxine (SYNTHROID, LEVOTHROID) 50 MCG tablet Take 1 tablet (50 mcg total) by mouth daily. 90 tablet 3 12/16/2015 at Unknown time  . simvastatin (ZOCOR) 10 MG tablet TAKE 1 BY MOUTH DAILY 90 tablet 3 12/16/2015 at Unknown time  . aspirin 81 MG tablet Take 81 mg by mouth daily.   12/15/2015  . Blood Glucose Monitoring Suppl (ONETOUCH VERIO IQ SYSTEM) W/DEVICE KIT Use as directed Dx: E11.9 (Patient taking differently: Check sugars twice a day, twice a week Dx: E11.9) 1 kit 0 Taking  . desoximetasone (TOPICORT) 0.05 % cream   0 Taking  . glucose blood (ONETOUCH VERIO) test strip Check sugar daily Dx: E11.9 100 each 5 Taking  . Lancets 30G MISC One touch delica; to check blood sugars twice daily; E11.9 100 each 3 Taking  . temazepam (RESTORIL) 30 MG capsule Take 1 capsule (30 mg total) by mouth at bedtime as needed. 30 capsule 0 12/15/2015  . zoledronic acid (RECLAST) 5 MG/100ML SOLN Inject 5 mg into the vein once.   Taking     Allergies  Allergen Reactions  . Iodinated Diagnostic Agents     Other reaction(s): Blood Disorder Uncoded Allergy. Allergen: ANESTHESIA ENDING IN THANE  . Ciprofloxacin Other (See Comments)    Intolerance when taking large doses  . Gentian Root     Blisters   . Halothane Other (See Comments)  Hepatitis and elevated liver enzymes  . Hydrocodone Itching  . Norco [Hydrocodone-Acetaminophen] Itching  . Sulfa Antibiotics Rash     Past Medical History:  Diagnosis Date  . Arthritis   . Breast cancer ([place]) 1986   s/p right lumpectomy, XRT and Tamoxifen  . Cancer ([place])    skin ca  . Chicken pox   . Colon polyps   . Diabetes ([place])   . Diverticulosis   . Environmental allergies   . GERD (gastroesophageal reflux disease)   . Glaucoma   . Hypercholesterolemia   . Hyperglycemia   .  Hypertension   . Hypothyroidism   . Osteopenia   . Peripheral neuropathy ([place])   . Urethral stricture     Review of systems:      Physical Exam    Heart and lungs: Regular rate and rhythm without rub or gallop, lungs are bilaterally clear.    HEENT: Normocephalic atraumatic eyes are anicteric    Other:     Pertinant exam for procedure: Off nontender nondistended bowel sounds positive normoactive.    Planned proceedures: EGD and indicated procedures. I have discussed the risks benefits and complications of procedures to include not limited to bleeding, infection, perforation and the risk of sedation and the patient wishes to proceed.    Lollie Sails, MD Gastroenterology 12/17/2015  9:48 AM

## 2015-12-17 NOTE — Anesthesia Preprocedure Evaluation (Signed)
Anesthesia Evaluation  Patient identified by MRN, date of birth, ID band Patient awake    Reviewed: Allergy & Precautions, H&P , NPO status , Patient's Chart, lab work & pertinent test results, reviewed documented beta blocker date and time   Airway Mallampati: II   Neck ROM: full    Dental  (+) Upper Dentures, Lower Dentures   Pulmonary neg pulmonary ROS,    Pulmonary exam normal        Cardiovascular hypertension, negative cardio ROS Normal cardiovascular exam Rhythm:regular Rate:Normal     Neuro/Psych  Neuromuscular disease negative neurological ROS  negative psych ROS   GI/Hepatic negative GI ROS, Neg liver ROS, GERD  Medicated,  Endo/Other  negative endocrine ROSdiabetesHypothyroidism   Renal/GU negative Renal ROS  negative genitourinary   Musculoskeletal   Abdominal   Peds  Hematology negative hematology ROS (+)   Anesthesia Other Findings Past Medical History: No date: Arthritis 1986: Breast cancer ([place])     Comment: s/p right lumpectomy, XRT and Tamoxifen No date: Cancer ([place])     Comment: skin ca No date: Chicken pox No date: Colon polyps No date: Diabetes ([facility]) No date: Diverticulosis No date: Environmental allergies No date: GERD (gastroesophageal reflux disease) No date: Glaucoma No date: Hypercholesterolemia No date: Hyperglycemia No date: Hypertension No date: Hypothyroidism No date: Osteopenia No date: Peripheral neuropathy ([facility]) No date: Urethral stricture Past Surgical History: 6/86: BREAST BIOPSY Right     Comment: lumpectomy-positive/rad 1986: BREAST LUMPECTOMY     Comment: right No date: EYE SURGERY     Comment: Bilateral cataracts removed 06/2008: KNEE SURGERY     Comment: torn meniscus, right 2012: KNEE SURGERY     Comment: torn menisucs, right 1993: MASTECTOMY     Comment: right, recurrence No date: TUBAL LIGATION No date: URETHRAL DILATION BMI    Body Mass Index:   28.15 kg/m     Reproductive/Obstetrics                             Anesthesia Physical Anesthesia Plan  ASA: III  Anesthesia Plan: General   Post-op Pain Management:    Induction:   Airway Management Planned:   Additional Equipment:   Intra-op Plan:   Post-operative Plan:   Informed Consent: I have reviewed the patients History and Physical, chart, labs and discussed the procedure including the risks, benefits and alternatives for the proposed anesthesia with the patient or authorized representative who has indicated his/her understanding and acceptance.   Dental Advisory Given  Plan Discussed with: CRNA  Anesthesia Plan Comments:         Anesthesia Quick Evaluation

## 2015-12-17 NOTE — Op Note (Signed)
[facility] Patient Name: Julie Hoover Procedure Date: 12/17/2015 9:26 AM MRN: [id number] Account #: [id number] Date of Birth: 1929-12-04 Admit Type: Outpatient Age: 82 Room: [facility] ENDO ROOM 4 Gender: Female Note Status: Finalized Procedure:            Upper GI endoscopy Indications:          Dysphagia Providers:            Lollie Sails, MD Referring MD:         Einar Pheasant, MD (Referring MD) Medicines:            Monitored Anesthesia Care Complications:        No immediate complications. Procedure:            Pre-Anesthesia Assessment:                       - ASA Grade Assessment: III - A patient with severe                        systemic disease.                       After obtaining informed consent, the endoscope was                        passed under direct vision. Throughout the procedure,                        the patient's blood pressure, pulse, and oxygen                        saturations were monitored continuously. The Endoscope                        was introduced through the mouth, and advanced to the                        second part of duodenum. The upper GI endoscopy was                        accomplished without difficulty. The patient tolerated                        the procedure well. Findings:      A non-bleeding diverticulum with a small opening and no stigmata of       recent bleeding was found in the proximal esophagus.      Abnormal motility was noted in the middle third of the esophagus and in       the lower third of the esophagus. The cricopharyngeus was normal. There       is spasticity and extra peristaltic waves of the esophageal body. The       distal esophagus/lower esophageal sphincter is open. Tertiary       peristaltic waves are noted.      The Z-line was variable. Biopsies were taken with a cold forceps for       histology.      Diffuse minimal inflammation characterized by erythema was found  in the       gastric body. Biopsies were taken with a cold forceps for histology.       Biopsies were  taken with a cold forceps for Helicobacter pylori testing.       Biopsies were taken with a cold forceps for histology. Biopsies were       taken with a cold forceps for Helicobacter pylori testing.      A small hiatal hernia was found. The Z-line was a variable distance from       incisors; the hiatal hernia was sliding.      The cardia and gastric fundus were normal on retroflexion otherwise.      The examined duodenum was normal. I was unable to go beyond the second       portion due to sharp angulation of the lumen. Impression:           - Diverticulum in the proximal esophagus.                       - Abnormal esophageal motility, consistent with                        presbyesophagus.                       - Z-line variable. Biopsied.                       - Gastritis. Biopsied.                       - Small hiatal hernia.                       - Normal examined duodenum. Recommendation:       - Continue present medications. Procedure Code(s):    --- Professional ---                       [phone], Esophagogastroduodenoscopy, flexible, transoral;                        with biopsy, single or multiple Diagnosis Code(s):    --- Professional ---                       Q39.6, Congenital diverticulum of esophagus                       K22.4, Dyskinesia of esophagus                       K22.8, Other specified diseases of esophagus                       K29.70, Gastritis, unspecified, without bleeding                       K44.9, Diaphragmatic hernia without obstruction or                        gangrene                       R13.10, Dysphagia, unspecified CPT copyright 2016 American Medical Association. All rights reserved. The codes documented in this report are preliminary and upon coder review may  be revised to meet current compliance requirements. Lollie Sails, MD 12/17/2015  10:12:51 AM This report has been signed electronically.  Number of Addenda: 0 Note Initiated On: 12/17/2015 9:26 AM      [facility]

## 2015-12-17 NOTE — Anesthesia Postprocedure Evaluation (Signed)
Anesthesia Post Note  Patient: Julie Hoover  Procedure(s) Performed: Procedure(s) (LRB): ESOPHAGOGASTRODUODENOSCOPY (EGD) WITH PROPOFOL (N/A)  Patient location during evaluation: PACU Anesthesia Type: General Level of consciousness: awake and alert Pain management: pain level controlled Vital Signs Assessment: post-procedure vital signs reviewed and stable Respiratory status: spontaneous breathing, nonlabored ventilation, respiratory function stable and patient connected to nasal cannula oxygen Cardiovascular status: blood pressure returned to baseline and stable Postop Assessment: no signs of nausea or vomiting Anesthetic complications: no    Last Vitals:  Vitals:   12/17/15 1030 12/17/15 1040  BP: 126/77 122/77  Pulse:    Resp:    Temp:      Last Pain:  Vitals:   12/17/15 1010  TempSrc: Tympanic                 Molli Barrows

## 2015-12-19 ENCOUNTER — Encounter: Payer: Self-pay | Admitting: Internal Medicine

## 2015-12-19 DIAGNOSIS — K219 Gastro-esophageal reflux disease without esophagitis: Secondary | ICD-10-CM | POA: Insufficient documentation

## 2015-12-19 DIAGNOSIS — Z09 Encounter for follow-up examination after completed treatment for conditions other than malignant neoplasm: Secondary | ICD-10-CM | POA: Diagnosis not present

## 2015-12-19 DIAGNOSIS — H61032 Chondritis of left external ear: Secondary | ICD-10-CM | POA: Diagnosis not present

## 2015-12-19 DIAGNOSIS — L821 Other seborrheic keratosis: Secondary | ICD-10-CM | POA: Diagnosis not present

## 2015-12-19 DIAGNOSIS — Z1283 Encounter for screening for malignant neoplasm of skin: Secondary | ICD-10-CM | POA: Diagnosis not present

## 2015-12-19 DIAGNOSIS — L570 Actinic keratosis: Secondary | ICD-10-CM | POA: Diagnosis not present

## 2015-12-19 DIAGNOSIS — Z872 Personal history of diseases of the skin and subcutaneous tissue: Secondary | ICD-10-CM | POA: Diagnosis not present

## 2015-12-19 LAB — SURGICAL PATHOLOGY

## 2015-12-25 ENCOUNTER — Ambulatory Visit (INDEPENDENT_AMBULATORY_CARE_PROVIDER_SITE_OTHER): Payer: Medicare Other

## 2015-12-25 DIAGNOSIS — E538 Deficiency of other specified B group vitamins: Secondary | ICD-10-CM

## 2015-12-25 MED ORDER — CYANOCOBALAMIN 1000 MCG/ML IJ SOLN
1000.0000 ug | Freq: Once | INTRAMUSCULAR | Status: AC
  Administered 2015-12-25: 1000 ug via INTRAMUSCULAR

## 2015-12-25 NOTE — Progress Notes (Addendum)
Pt is in today receiving a B12 injection in the right deltoid. Pt tolerated well.  Reviewed above.    Dr Nicki Reaper

## 2016-01-02 DIAGNOSIS — H401133 Primary open-angle glaucoma, bilateral, severe stage: Secondary | ICD-10-CM | POA: Diagnosis not present

## 2016-01-15 DIAGNOSIS — H401133 Primary open-angle glaucoma, bilateral, severe stage: Secondary | ICD-10-CM | POA: Diagnosis not present

## 2016-01-29 ENCOUNTER — Other Ambulatory Visit: Payer: Self-pay

## 2016-01-30 ENCOUNTER — Other Ambulatory Visit (INDEPENDENT_AMBULATORY_CARE_PROVIDER_SITE_OTHER): Payer: Medicare Other

## 2016-01-30 DIAGNOSIS — E7800 Pure hypercholesterolemia, unspecified: Secondary | ICD-10-CM | POA: Diagnosis not present

## 2016-01-30 DIAGNOSIS — E119 Type 2 diabetes mellitus without complications: Secondary | ICD-10-CM | POA: Diagnosis not present

## 2016-01-30 LAB — HEPATIC FUNCTION PANEL
ALBUMIN: 4.2 g/dL (ref 3.5–5.2)
ALK PHOS: 48 U/L (ref 39–117)
ALT: 11 U/L (ref 0–35)
AST: 12 U/L (ref 0–37)
Bilirubin, Direct: 0.1 mg/dL (ref 0.0–0.3)
Total Bilirubin: 0.7 mg/dL (ref 0.2–1.2)
Total Protein: 7.3 g/dL (ref 6.0–8.3)

## 2016-01-30 LAB — LIPID PANEL
CHOLESTEROL: 194 mg/dL (ref 0–200)
HDL: 64.7 mg/dL (ref >39.00)
LDL Cholesterol: 114 mg/dL — ABNORMAL HIGH (ref 0–99)
NONHDL: 128.92
Total CHOL/HDL Ratio: 3
Triglycerides: 75 mg/dL (ref 0.0–149.0)
VLDL: 15 mg/dL (ref 0.0–40.0)

## 2016-01-30 LAB — BASIC METABOLIC PANEL
BUN: 15 mg/dL (ref 6–23)
CHLORIDE: 104 meq/L (ref 96–112)
CO2: 29 meq/L (ref 19–32)
CREATININE: 0.91 mg/dL (ref 0.40–1.20)
Calcium: 9.7 mg/dL (ref 8.4–10.5)
GFR: 62.55 mL/min (ref >60.00)
Glucose, Bld: 112 mg/dL — ABNORMAL HIGH (ref 70–99)
Potassium: 4.6 mEq/L (ref 3.5–5.1)
Sodium: 141 mEq/L (ref 135–145)

## 2016-01-30 LAB — HEMOGLOBIN A1C: Hgb A1c MFr Bld: 6.6 % — ABNORMAL HIGH (ref 4.6–6.5)

## 2016-01-31 ENCOUNTER — Ambulatory Visit (INDEPENDENT_AMBULATORY_CARE_PROVIDER_SITE_OTHER): Payer: Medicare Other | Admitting: Internal Medicine

## 2016-01-31 ENCOUNTER — Encounter: Payer: Self-pay | Admitting: Internal Medicine

## 2016-01-31 VITALS — BP 130/70 | HR 70 | Temp 98.0°F | Ht 64.0 in | Wt 161.0 lb

## 2016-01-31 DIAGNOSIS — E119 Type 2 diabetes mellitus without complications: Secondary | ICD-10-CM

## 2016-01-31 DIAGNOSIS — E039 Hypothyroidism, unspecified: Secondary | ICD-10-CM | POA: Diagnosis not present

## 2016-01-31 DIAGNOSIS — I10 Essential (primary) hypertension: Secondary | ICD-10-CM

## 2016-01-31 DIAGNOSIS — K2100 Gastro-esophageal reflux disease with esophagitis, without bleeding: Secondary | ICD-10-CM

## 2016-01-31 DIAGNOSIS — E7800 Pure hypercholesterolemia, unspecified: Secondary | ICD-10-CM

## 2016-01-31 DIAGNOSIS — G6289 Other specified polyneuropathies: Secondary | ICD-10-CM | POA: Diagnosis not present

## 2016-01-31 DIAGNOSIS — E538 Deficiency of other specified B group vitamins: Secondary | ICD-10-CM

## 2016-01-31 DIAGNOSIS — Z853 Personal history of malignant neoplasm of breast: Secondary | ICD-10-CM

## 2016-01-31 DIAGNOSIS — Z23 Encounter for immunization: Secondary | ICD-10-CM | POA: Diagnosis not present

## 2016-01-31 MED ORDER — CYANOCOBALAMIN 1000 MCG/ML IJ SOLN
1000.0000 ug | Freq: Once | INTRAMUSCULAR | Status: AC
  Administered 2016-01-31: 1000 ug via INTRAMUSCULAR

## 2016-01-31 NOTE — Patient Instructions (Signed)
Align (probiotic) - one per day for the next 3 weeks.

## 2016-01-31 NOTE — Progress Notes (Signed)
Pre visit review using our clinic review tool, if applicable. No additional management support is needed unless otherwise documented below in the visit note. 

## 2016-01-31 NOTE — Progress Notes (Signed)
Patient ID: Julie Hoover, female   DOB: Sep 03, 1929, 82 y.o.   MRN: [id number]   Subjective:    Patient ID: Julie Hoover, female    DOB: 02/09/1930, 82 y.o.   MRN: [id number]  HPI  Patient here for a scheduled follow up.  She was just recently evaluated by GI for dysphagia.  Had barium swallow as outlined.  EGD performed and revealed diverticulum in the proximal esophagus, abnormal esophageal motility, gastritis and a small hiatal hernia.  She was treated for H. Pylori.  Some residual diarrhea from the abx.  Instructed her to take probiotic.  No abdominal pain.  Eating well.  No chest pain or tightness. Watching her sugars.  a1c just checked 6.6.  Sugar list reviewed.     Past Medical History:  Diagnosis Date  . Arthritis   . Breast cancer ([place]) 1986   s/p right lumpectomy, XRT and Tamoxifen  . Cancer ([place])    skin ca  . Chicken pox   . Colon polyps   . Diabetes ([place])   . Diverticulosis   . Environmental allergies   . GERD (gastroesophageal reflux disease)   . Glaucoma   . Hypercholesterolemia   . Hyperglycemia   . Hypertension   . Hypothyroidism   . Osteopenia   . Peripheral neuropathy ([place])   . Urethral stricture    Past Surgical History:  Procedure Laterality Date  . BREAST BIOPSY Right 6/86   lumpectomy-positive/rad  . BREAST LUMPECTOMY  1986   right  . ESOPHAGOGASTRODUODENOSCOPY (EGD) WITH PROPOFOL N/A 12/17/2015   Procedure: ESOPHAGOGASTRODUODENOSCOPY (EGD) WITH PROPOFOL;  Surgeon: Lollie Sails, MD;  Location: [facility] ENDOSCOPY;  Service: Endoscopy;  Laterality: N/A;  . EYE SURGERY     Bilateral cataracts removed  . KNEE SURGERY  06/2008   torn meniscus, right  . KNEE SURGERY  2012   torn menisucs, right  . MASTECTOMY  1993   right, recurrence  . TUBAL LIGATION    . URETHRAL DILATION     Family History  Problem Relation Age of Onset  . Heart disease Father   . Hypertension Father   . COPD Mother   . Hypertension Brother     x2  .  Hypercholesterolemia Brother     x2  . Hypertension Sister   . Hypercholesterolemia Sister   . Diabetes Mellitus II Sister   . Cancer      mouth, aunt  . Colon cancer Neg Hx   . Breast cancer Neg Hx    Social History   Social History  . Marital status: Widowed    Spouse name: N/A  . Number of children: 2  . Years of education: N/A   Social History Main Topics  . Smoking status: Never Smoker  . Smokeless tobacco: Never Used  . Alcohol use No  . Drug use: No  . Sexual activity: No   Other Topics Concern  . None   Social History Narrative  . None    Outpatient Encounter Prescriptions as of 01/31/2016  Medication Sig  . amLODipine (NORVASC) 5 MG tablet TAKE 1 BY MOUTH DAILY  . aspirin 81 MG tablet Take 81 mg by mouth daily.  . Blood Glucose Monitoring Suppl (ONETOUCH VERIO IQ SYSTEM) W/DEVICE KIT Use as directed Dx: E11.9 (Patient taking differently: Check sugars twice a day, twice a week Dx: E11.9)  . Calcium Carbonate-Vitamin D (CALCIUM 600+D) 600-400 MG-UNIT per tablet Take 1 tablet by mouth 3 (three) times daily.   . cetirizine (  ZYRTEC) 10 MG tablet Take 10 mg by mouth daily.  . Cyanocobalamin (VITAMIN B-12 IJ) Inject as directed once. monthly  . desoximetasone (TOPICORT) 0.05 % cream   . dorzolamide-timolol (COSOPT) 22.3-6.8 MG/ML ophthalmic solution Place 2 drops into the left eye 2 (two) times daily.   Marland Kitchen glucose blood (ONETOUCH VERIO) test strip Check sugar daily Dx: E11.9  . Lancets 30G MISC One touch delica; to check blood sugars twice daily; E11.9  . latanoprost (XALATAN) 0.005 % ophthalmic solution Place 1 drop into the left eye at bedtime.  Marland Kitchen levothyroxine (SYNTHROID, LEVOTHROID) 50 MCG tablet Take 1 tablet (50 mcg total) by mouth daily.  . simvastatin (ZOCOR) 10 MG tablet TAKE 1 BY MOUTH DAILY  . temazepam (RESTORIL) 30 MG capsule Take 1 capsule (30 mg total) by mouth at bedtime as needed.  . zoledronic acid (RECLAST) 5 MG/100ML SOLN Inject 5 mg into the vein  once.  . [EXPIRED] cyanocobalamin ((VITAMIN B-12)) injection 1,000 mcg    No facility-administered encounter medications on file as of 01/31/2016.     Review of Systems  Constitutional: Negative for appetite change and unexpected weight change.  HENT: Negative for congestion and sinus pressure.   Respiratory: Negative for cough, chest tightness and shortness of breath.   Cardiovascular: Negative for chest pain, palpitations and leg swelling.  Gastrointestinal: Positive for diarrhea. Negative for abdominal pain, nausea and vomiting.  Genitourinary: Negative for difficulty urinating and dysuria.  Musculoskeletal: Negative for back pain and joint swelling.  Skin: Negative for color change and rash.  Neurological: Negative for dizziness, light-headedness and headaches.  Psychiatric/Behavioral: Negative for agitation and dysphoric mood.       Objective:    Physical Exam  Constitutional: She appears well-developed and well-nourished. No distress.  HENT:  Nose: Nose normal.  Mouth/Throat: Oropharynx is clear and moist.  Neck: Neck supple. No thyromegaly present.  Cardiovascular: Normal rate and regular rhythm.   Pulmonary/Chest: Breath sounds normal. No respiratory distress. She has no wheezes.  Abdominal: Soft. Bowel sounds are normal. There is no tenderness.  Musculoskeletal: She exhibits no edema or tenderness.  Lymphadenopathy:    She has no cervical adenopathy.  Skin: No rash noted. No erythema.  Psychiatric: She has a normal mood and affect. Her behavior is normal.    BP 130/70   Pulse 70   Temp 98 F (36.7 C) (Oral)   Ht _0  (1.626 m)   Wt 161 lb (73 kg)   SpO2 94%   BMI 27.64 kg/m  Wt Readings from Last 3 Encounters:  01/31/16 161 lb (73 kg)  12/17/15 164 lb (74.4 kg)  10/29/15 164 lb (74.4 kg)     Lab Results  Component Value Date   WBC 9.2 05/21/2015   HGB 14.0 05/21/2015   HCT 43.2 05/21/2015   PLT 256.0 05/21/2015   GLUCOSE 112 (H) 01/30/2016   CHOL  194 01/30/2016   TRIG 75.0 01/30/2016   HDL 64.70 01/30/2016   LDLCALC 114 (H) 01/30/2016   ALT 11 01/30/2016   AST 12 01/30/2016   NA 141 01/30/2016   K 4.6 01/30/2016   CL 104 01/30/2016   CREATININE 0.91 01/30/2016   BUN 15 01/30/2016   CO2 29 01/30/2016   TSH 2.88 09/24/2015   HGBA1C 6.6 (H) 01/30/2016   MICROALBUR <0.7 01/23/2015       Assessment & Plan:   Problem List Items Addressed This Visit    Diabetes ([place])    a1c just checked 6.6.  Low  carb diet and exercise.  Follow met b and a1c.       Relevant Orders   Hemoglobin A1c   Microalbumin / creatinine urine ratio   GERD (gastroesophageal reflux disease)    EGD 12/17/15 as outlined.  Currently doing well.        History of breast cancer    Mammogram 11/12/15 - Birads I.       Hypercholesterolemia    On simvastatin.  Low cholesterol diet and exercise.  Follow lipid panel and liver function tests.        Relevant Orders   Lipid panel   Hepatic function panel   Hypertension    Blood pressure under good control.  Continue same medication regimen.  Follow pressures.  Follow metabolic panel.        Relevant Orders   Basic metabolic panel   CBC with Differential/Platelet   Hypothyroidism    On thyroid replacement.  Follow tsh.       Peripheral neuropathy ([facility])    Stable.         Other Visit Diagnoses    B12 deficiency    -  Primary   Relevant Medications   cyanocobalamin ((VITAMIN B-12)) injection 1,000 mcg (Completed)   Encounter for immunization       Relevant Medications   cyanocobalamin ((VITAMIN B-12)) injection 1,000 mcg (Completed)   Other Relevant Orders   Flu vaccine HIGH DOSE PF (Completed)       Einar Pheasant, MD

## 2016-02-03 NOTE — Assessment & Plan Note (Signed)
Blood pressure under good control.  Continue same medication regimen.  Follow pressures.  Follow metabolic panel.   

## 2016-02-03 NOTE — Assessment & Plan Note (Signed)
a1c just checked 6.6.  Low carb diet and exercise.  Follow met b and a1c.

## 2016-02-03 NOTE — Assessment & Plan Note (Signed)
On simvastatin.  Low cholesterol diet and exercise.  Follow lipid panel and liver function tests.   

## 2016-02-03 NOTE — Assessment & Plan Note (Signed)
EGD 12/17/15 as outlined.  Currently doing well.

## 2016-02-03 NOTE — Assessment & Plan Note (Signed)
On thyroid replacement.  Follow tsh.  

## 2016-02-03 NOTE — Assessment & Plan Note (Signed)
Stable

## 2016-02-03 NOTE — Assessment & Plan Note (Signed)
Mammogram 11/12/15 - Birads I.   

## 2016-02-11 DIAGNOSIS — K210 Gastro-esophageal reflux disease with esophagitis: Secondary | ICD-10-CM | POA: Diagnosis not present

## 2016-02-11 DIAGNOSIS — A048 Other specified bacterial intestinal infections: Secondary | ICD-10-CM | POA: Diagnosis not present

## 2016-02-11 DIAGNOSIS — Q396 Congenital diverticulum of esophagus: Secondary | ICD-10-CM | POA: Diagnosis not present

## 2016-02-13 ENCOUNTER — Other Ambulatory Visit: Payer: Self-pay | Admitting: Internal Medicine

## 2016-02-13 MED ORDER — TEMAZEPAM 30 MG PO CAPS: 30.0000 mg | ORAL_CAPSULE | Freq: Every evening | ORAL | 0 refills | Status: DC | PRN

## 2016-02-13 NOTE — Telephone Encounter (Signed)
Las refill sent in 10/24/14. Last OV 01/31/16.

## 2016-02-13 NOTE — Telephone Encounter (Signed)
Faxed to pharmacy

## 2016-03-04 ENCOUNTER — Ambulatory Visit (INDEPENDENT_AMBULATORY_CARE_PROVIDER_SITE_OTHER): Payer: Medicare Other

## 2016-03-04 DIAGNOSIS — E538 Deficiency of other specified B group vitamins: Secondary | ICD-10-CM | POA: Diagnosis not present

## 2016-03-04 MED ORDER — CYANOCOBALAMIN 1000 MCG/ML IJ SOLN
1000.0000 ug | Freq: Once | INTRAMUSCULAR | Status: AC
  Administered 2016-03-04: 1000 ug via INTRAMUSCULAR

## 2016-03-04 NOTE — Progress Notes (Addendum)
Patient comes in B 12 injection .  Injected right deltoid patient tolerated injection well.     Reviewed.  Dr Nicki Reaper

## 2016-04-02 DIAGNOSIS — A048 Other specified bacterial intestinal infections: Secondary | ICD-10-CM | POA: Diagnosis not present

## 2016-04-08 ENCOUNTER — Ambulatory Visit (INDEPENDENT_AMBULATORY_CARE_PROVIDER_SITE_OTHER): Payer: Medicare Other

## 2016-04-08 DIAGNOSIS — E538 Deficiency of other specified B group vitamins: Secondary | ICD-10-CM | POA: Diagnosis not present

## 2016-04-08 MED ORDER — CYANOCOBALAMIN 1000 MCG/ML IJ SOLN
1000.0000 ug | Freq: Once | INTRAMUSCULAR | Status: AC
  Administered 2016-04-08: 1000 ug via INTRAMUSCULAR

## 2016-04-08 NOTE — Progress Notes (Addendum)
Patient comes in for B 12 injection.  Injected left deltoid.  Patient tolerated injection well.   Reviewed.  Dr Scott 

## 2016-04-10 ENCOUNTER — Ambulatory Visit: Payer: Self-pay | Admitting: Family

## 2016-04-23 ENCOUNTER — Other Ambulatory Visit: Payer: Self-pay | Admitting: Internal Medicine

## 2016-04-24 NOTE — Telephone Encounter (Signed)
Gabapentin was last filled on 10/02/15 #270 +1, ok to refill?

## 2016-05-05 ENCOUNTER — Ambulatory Visit (INDEPENDENT_AMBULATORY_CARE_PROVIDER_SITE_OTHER): Payer: Medicare Other | Admitting: Family

## 2016-05-05 ENCOUNTER — Encounter: Payer: Self-pay | Admitting: Family

## 2016-05-05 VITALS — BP 134/60 | HR 60 | Temp 97.4°F | Ht 64.0 in | Wt 160.4 lb

## 2016-05-05 DIAGNOSIS — H6123 Impacted cerumen, bilateral: Secondary | ICD-10-CM

## 2016-05-05 NOTE — Patient Instructions (Signed)
Ears look great Pleasure meeting you!

## 2016-05-05 NOTE — Progress Notes (Addendum)
Subjective:    Patient ID: Julie Hoover, female    DOB: 1930/02/17, 83 y.o.   MRN: [id number]  CC: Julie Hoover is a 83 y.o. female who presents today for an acute visit.    HPI: CC: ear wax buildup in right ear. Decreased hearing and feels loose piece of wax. Takes zyrtec. No cough, fever, post nasal drip, nasal congestion.  No qtips. Has tried a Psychologist, sport and exercise.       HISTORY:  Past Medical History:  Diagnosis Date  . Arthritis   . Breast cancer ([place]) 1986   s/p right lumpectomy, XRT and Tamoxifen  . Cancer ([place])    skin ca  . Chicken pox   . Colon polyps   . Diabetes ([place])   . Diverticulosis   . Environmental allergies   . GERD (gastroesophageal reflux disease)   . Glaucoma   . Hypercholesterolemia   . Hyperglycemia   . Hypertension   . Hypothyroidism   . Osteopenia   . Peripheral neuropathy ([place])   . Urethral stricture    Past Surgical History:  Procedure Laterality Date  . BREAST BIOPSY Right 6/86   lumpectomy-positive/rad  . BREAST LUMPECTOMY  1986   right  . ESOPHAGOGASTRODUODENOSCOPY (EGD) WITH PROPOFOL N/A 12/17/2015   Procedure: ESOPHAGOGASTRODUODENOSCOPY (EGD) WITH PROPOFOL;  Surgeon: Lollie Sails, MD;  Location[facility] ENDOSCOPY;  Service: Endoscopy;  Laterality: N/A;  . EYE SURGERY     Bilateral cataracts removed  . KNEE SURGERY  06/2008   torn meniscus, right  . KNEE SURGERY  2012   torn menisucs, right  . MASTECTOMY  1993   right, recurrence  . TUBAL LIGATION    . URETHRAL DILATION     Family History  Problem Relation Age of Onset  . Heart disease Father   . Hypertension Father   . COPD Mother   . Hypertension Brother     x2  . Hypercholesterolemia Brother     x2  . Hypertension Sister   . Hypercholesterolemia Sister   . Diabetes Mellitus II Sister   . Cancer      mouth, aunt  . Colon cancer Neg Hx   . Breast cancer Neg Hx     Allergies: Iodinated diagnostic agents; Ciprofloxacin; Gentian root; Halothane;  Hydrocodone; Norco [hydrocodone-acetaminophen]; and Sulfa antibiotics Current Outpatient Prescriptions on File Prior to Visit  Medication Sig Dispense Refill  . amLODipine (NORVASC) 5 MG tablet TAKE 1 BY MOUTH DAILY 90 tablet 3  . aspirin 81 MG tablet Take 81 mg by mouth daily.    . Blood Glucose Monitoring Suppl (ONETOUCH VERIO IQ SYSTEM) W/DEVICE KIT Use as directed Dx: E11.9 (Patient taking differently: Check sugars twice a day, twice a week Dx: E11.9) 1 kit 0  . Calcium Carbonate-Vitamin D (CALCIUM 600+D) 600-400 MG-UNIT per tablet Take 1 tablet by mouth 3 (three) times daily.     . cetirizine (ZYRTEC) 10 MG tablet Take 10 mg by mouth daily.    . Cyanocobalamin (VITAMIN B-12 IJ) Inject as directed once. monthly    . desoximetasone (TOPICORT) 0.05 % cream   0  . dorzolamide-timolol (COSOPT) 22.3-6.8 MG/ML ophthalmic solution Place 2 drops into the left eye 2 (two) times daily.     Marland Kitchen gabapentin (NEURONTIN) 100 MG capsule TAKE 1 CAPSULE EVERY MORNING  AND TAKE 2 CAPSULES AT BEDTIME 270 capsule 1  . glucose blood (ONETOUCH VERIO) test strip Check sugar daily Dx: E11.9 100 each 5  . Lancets 30G MISC One touch  delica; to check blood sugars twice daily; E11.9 100 each 3  . latanoprost (XALATAN) 0.005 % ophthalmic solution Place 1 drop into the left eye at bedtime.  5  . levothyroxine (SYNTHROID, LEVOTHROID) 50 MCG tablet Take 1 tablet (50 mcg total) by mouth daily. 90 tablet 3  . simvastatin (ZOCOR) 10 MG tablet TAKE 1 TABLET EVERY DAY 90 tablet 3  . temazepam (RESTORIL) 30 MG capsule Take 1 capsule (30 mg total) by mouth at bedtime as needed. 30 capsule 0  . zoledronic acid (RECLAST) 5 MG/100ML SOLN Inject 5 mg into the vein once.     No current facility-administered medications on file prior to visit.     Social History  Substance Use Topics  . Smoking status: Never Smoker  . Smokeless tobacco: Never Used  . Alcohol use No    Review of Systems  Constitutional: Negative for chills and  fever.  HENT: Positive for hearing loss (right). Negative for ear discharge, ear pain, postnasal drip and sore throat.   Eyes: Negative for visual disturbance.  Respiratory: Negative for cough.   Cardiovascular: Negative for chest pain and palpitations.  Gastrointestinal: Negative for nausea and vomiting.      Objective:    BP 134/60   Pulse 60   Temp 97.4 F (36.3 C) (Oral)   Ht '5\' 4"'$  (1.626 m)   Wt 160 lb 6.4 oz (72.8 kg)   SpO2 95%   BMI 27.53 kg/m    Physical Exam  Constitutional: She appears well-developed and well-nourished.  HENT:  Head: Normocephalic and atraumatic.  Right Ear: Hearing, tympanic membrane, external ear and ear canal normal. No drainage, swelling or tenderness. No foreign bodies. No middle ear effusion. No decreased hearing is noted.  Left Ear: Hearing, tympanic membrane, external ear and ear canal normal. No drainage, swelling or tenderness. No foreign bodies. Tympanic membrane is not bulging.  No middle ear effusion. No decreased hearing is noted.  Nose: Nose normal. No rhinorrhea. Right sinus exhibits no maxillary sinus tenderness and no frontal sinus tenderness. Left sinus exhibits no maxillary sinus tenderness and no frontal sinus tenderness.  Mouth/Throat: Uvula is midline, oropharynx is clear and moist and mucous membranes are normal. No oropharyngeal exudate, posterior oropharyngeal edema, posterior oropharyngeal erythema or tonsillar abscesses.  Bilateral tympanic membranes obscured by cerumen.  Eyes: Conjunctivae are normal.  Cardiovascular: Regular rhythm, normal heart sounds and normal pulses.   Pulmonary/Chest: Effort normal and breath sounds normal. She has no wheezes. She has no rhonchi. She has no rales.  Lymphadenopathy:       Head (right side): No submental, no submandibular, no tonsillar, no preauricular, no posterior auricular and no occipital adenopathy present.       Head (left side): No submental, no submandibular, no tonsillar, no  preauricular, no posterior auricular and no occipital adenopathy present.    She has no cervical adenopathy.  Neurological: She is alert.  Skin: Skin is warm and dry.  Psychiatric: She has a normal mood and affect. Her speech is normal and behavior is normal. Thought content normal.  Vitals reviewed.      Assessment & Plan:   1. Bilateral impacted cerumen  Bilateral cerumen impactions, resolved with multiple irrigation and manual removal with curette by CMA in both left and right ear.  After which, I also examined patient and the EAC's and TM's are clear. Patient tolerated well.     I am having Ms. Wendi Snipes maintain her aspirin, Calcium Carbonate-Vitamin D, cetirizine, zoledronic acid, Cyanocobalamin (  VITAMIN B-12 IJ), dorzolamide-timolol, ONETOUCH VERIO IQ SYSTEM, latanoprost, Lancets 30G, desoximetasone, levothyroxine, amLODipine, glucose blood, temazepam, gabapentin, and simvastatin.   No orders of the defined types were placed in this encounter.   Return precautions given.   Risks, benefits, and alternatives of the medications and treatment plan prescribed today were discussed, and patient expressed understanding.   Education regarding symptom management and diagnosis given to patient on AVS.  Continue to follow with Einar Pheasant, MD for routine health maintenance.   Julie Hoover and I agreed with plan.   Mable Paris, FNP

## 2016-05-05 NOTE — Progress Notes (Signed)
Pre visit review using our clinic review tool, if applicable. No additional management support is needed unless otherwise documented below in the visit note. 

## 2016-05-13 ENCOUNTER — Ambulatory Visit (INDEPENDENT_AMBULATORY_CARE_PROVIDER_SITE_OTHER): Payer: Medicare Other

## 2016-05-13 DIAGNOSIS — E538 Deficiency of other specified B group vitamins: Secondary | ICD-10-CM | POA: Diagnosis not present

## 2016-05-13 MED ORDER — CYANOCOBALAMIN 1000 MCG/ML IJ SOLN
1000.0000 ug | Freq: Once | INTRAMUSCULAR | Status: AC
  Administered 2016-05-13: 1000 ug via INTRAMUSCULAR

## 2016-05-13 NOTE — Progress Notes (Addendum)
Patient comes in for B 12 injection.  Injected right deltoid. Patient tolerated injected.    Reviewed.  Dr Nicki Reaper

## 2016-05-16 DIAGNOSIS — H401233 Low-tension glaucoma, bilateral, severe stage: Secondary | ICD-10-CM | POA: Diagnosis not present

## 2016-05-28 NOTE — Addendum Note (Signed)
Addended by: Burnard Hawthorne on: 05/28/2016 12:18 PM   Modules accepted: Level of Service

## 2016-05-29 ENCOUNTER — Other Ambulatory Visit (INDEPENDENT_AMBULATORY_CARE_PROVIDER_SITE_OTHER): Payer: Medicare Other

## 2016-05-29 DIAGNOSIS — I10 Essential (primary) hypertension: Secondary | ICD-10-CM

## 2016-05-29 DIAGNOSIS — E119 Type 2 diabetes mellitus without complications: Secondary | ICD-10-CM

## 2016-05-29 DIAGNOSIS — E7800 Pure hypercholesterolemia, unspecified: Secondary | ICD-10-CM

## 2016-05-29 LAB — MICROALBUMIN / CREATININE URINE RATIO
CREATININE, U: 69 mg/dL
Microalb Creat Ratio: 1 mg/g (ref 0.0–30.0)
Microalb, Ur: <0.7 mg/dL (ref 0.0–1.9)

## 2016-05-29 LAB — CBC WITH DIFFERENTIAL/PLATELET
BASOS PCT: 0.7 % (ref 0.0–3.0)
Basophils Absolute: 0 10*3/uL (ref 0.0–0.1)
EOS ABS: 0.1 10*3/uL (ref 0.0–0.7)
Eosinophils Relative: 0.8 % (ref 0.0–5.0)
HEMATOCRIT: 42.3 % (ref 36.0–46.0)
Hemoglobin: 14 g/dL (ref 12.0–15.0)
LYMPHS ABS: 1.9 10*3/uL (ref 0.7–4.0)
LYMPHS PCT: 24.7 % (ref 12.0–46.0)
MCHC: 33.1 g/dL (ref 30.0–36.0)
MCV: 91.9 fl (ref 78.0–100.0)
Monocytes Absolute: 0.6 10*3/uL (ref 0.1–1.0)
Monocytes Relative: 8.2 % (ref 3.0–12.0)
NEUTROS ABS: 4.9 10*3/uL (ref 1.4–7.7)
Neutrophils Relative %: 65.6 % (ref 43.0–77.0)
PLATELETS: 273 10*3/uL (ref 150.0–400.0)
RBC: 4.6 Mil/uL (ref 3.87–5.11)
RDW: 14.5 % (ref 11.5–15.5)
WBC: 7.5 10*3/uL (ref 4.0–10.5)

## 2016-05-29 LAB — HEPATIC FUNCTION PANEL
ALK PHOS: 48 U/L (ref 39–117)
ALT: 13 U/L (ref 0–35)
AST: 15 U/L (ref 0–37)
Albumin: 4.5 g/dL (ref 3.5–5.2)
BILIRUBIN DIRECT: 0.1 mg/dL (ref 0.0–0.3)
BILIRUBIN TOTAL: 0.7 mg/dL (ref 0.2–1.2)
Total Protein: 7.4 g/dL (ref 6.0–8.3)

## 2016-05-29 LAB — LIPID PANEL
CHOLESTEROL: 188 mg/dL (ref 0–200)
HDL: 71.9 mg/dL (ref >39.00)
LDL CALC: 103 mg/dL — AB (ref 0–99)
NonHDL: 116.48
Total CHOL/HDL Ratio: 3
Triglycerides: 65 mg/dL (ref 0.0–149.0)
VLDL: 13 mg/dL (ref 0.0–40.0)

## 2016-05-29 LAB — BASIC METABOLIC PANEL
BUN: 12 mg/dL (ref 6–23)
CALCIUM: 9.9 mg/dL (ref 8.4–10.5)
CO2: 33 mEq/L — ABNORMAL HIGH (ref 19–32)
CREATININE: 0.86 mg/dL (ref 0.40–1.20)
Chloride: 105 mEq/L (ref 96–112)
GFR: 66.71 mL/min (ref >60.00)
Glucose, Bld: 107 mg/dL — ABNORMAL HIGH (ref 70–99)
Potassium: 4.7 mEq/L (ref 3.5–5.1)
Sodium: 143 mEq/L (ref 135–145)

## 2016-05-29 LAB — HEMOGLOBIN A1C: HEMOGLOBIN A1C: 6.7 % — AB (ref 4.6–6.5)

## 2016-05-30 ENCOUNTER — Encounter: Payer: Self-pay | Admitting: Internal Medicine

## 2016-06-02 ENCOUNTER — Ambulatory Visit: Payer: Self-pay | Admitting: Internal Medicine

## 2016-06-10 ENCOUNTER — Encounter: Payer: Self-pay | Admitting: Internal Medicine

## 2016-06-10 ENCOUNTER — Ambulatory Visit (INDEPENDENT_AMBULATORY_CARE_PROVIDER_SITE_OTHER): Payer: Medicare Other | Admitting: Internal Medicine

## 2016-06-10 VITALS — BP 136/76 | HR 60 | Temp 98.4°F | Ht 64.0 in | Wt 158.0 lb

## 2016-06-10 DIAGNOSIS — R195 Other fecal abnormalities: Secondary | ICD-10-CM

## 2016-06-10 DIAGNOSIS — E119 Type 2 diabetes mellitus without complications: Secondary | ICD-10-CM

## 2016-06-10 DIAGNOSIS — E039 Hypothyroidism, unspecified: Secondary | ICD-10-CM | POA: Diagnosis not present

## 2016-06-10 DIAGNOSIS — K2100 Gastro-esophageal reflux disease with esophagitis, without bleeding: Secondary | ICD-10-CM

## 2016-06-10 DIAGNOSIS — M542 Cervicalgia: Secondary | ICD-10-CM

## 2016-06-10 DIAGNOSIS — G6289 Other specified polyneuropathies: Secondary | ICD-10-CM | POA: Diagnosis not present

## 2016-06-10 DIAGNOSIS — I10 Essential (primary) hypertension: Secondary | ICD-10-CM

## 2016-06-10 DIAGNOSIS — E7800 Pure hypercholesterolemia, unspecified: Secondary | ICD-10-CM

## 2016-06-10 MED ORDER — OMEPRAZOLE 20 MG PO CPDR: 20.0000 mg | DELAYED_RELEASE_CAPSULE | Freq: Two times a day (BID) | ORAL | 1 refills | Status: DC

## 2016-06-10 MED ORDER — TIZANIDINE HCL 2 MG PO CAPS: 2.0000 mg | ORAL_CAPSULE | Freq: Every evening | ORAL | 0 refills | Status: DC | PRN

## 2016-06-10 NOTE — Progress Notes (Signed)
Pre-visit discussion using our clinic review tool. No additional management support is needed unless otherwise documented below in the visit note.  

## 2016-06-10 NOTE — Progress Notes (Signed)
Patient ID: Julie Hoover, female   DOB: 09-02-1929, 83 y.o.   MRN: [id number]   Subjective:    Patient ID: Julie Hoover, female    DOB: 17-Jun-1929, 83 y.o.   MRN: [id number]  HPI  Patient here for a scheduled follow up.  She reports she is doing relatively well.  No chest pain.  Breathing stable.  She tries to stay active.  Does have left side neck pain.  Just started. Was working on her computer and noticed increased pain.  Limited rom with rotation.  Taking alleve.  Is better.  No acid reflux.  No abdominal pain.  Reports loose stool.  May occur 1-2x/day.  Having some trouble swallowing.     Past Medical History:  Diagnosis Date  . Arthritis   . Breast cancer ([place]) 1986   s/p right lumpectomy, XRT and Tamoxifen  . Cancer ([place])    skin ca  . Chicken pox   . Colon polyps   . Diabetes ([place])   . Diverticulosis   . Environmental allergies   . GERD (gastroesophageal reflux disease)   . Glaucoma   . Hypercholesterolemia   . Hyperglycemia   . Hypertension   . Hypothyroidism   . Osteopenia   . Peripheral neuropathy ([place])   . Urethral stricture    Past Surgical History:  Procedure Laterality Date  . BREAST BIOPSY Right 6/86   lumpectomy-positive/rad  . BREAST LUMPECTOMY  1986   right  . ESOPHAGOGASTRODUODENOSCOPY (EGD) WITH PROPOFOL N/A 12/17/2015   Procedure: ESOPHAGOGASTRODUODENOSCOPY (EGD) WITH PROPOFOL;  Surgeon: Lollie Sails, MD;  Location: [facility] ENDOSCOPY;  Service: Endoscopy;  Laterality: N/A;  . EYE SURGERY     Bilateral cataracts removed  . KNEE SURGERY  06/2008   torn meniscus, right  . KNEE SURGERY  2012   torn menisucs, right  . MASTECTOMY  1993   right, recurrence  . TUBAL LIGATION    . URETHRAL DILATION     Family History  Problem Relation Age of Onset  . Heart disease Father   . Hypertension Father   . COPD Mother   . Hypertension Brother     x2  . Hypercholesterolemia Brother     x2  . Hypertension Sister   .  Hypercholesterolemia Sister   . Diabetes Mellitus II Sister   . Cancer      mouth, aunt  . Colon cancer Neg Hx   . Breast cancer Neg Hx    Social History   Social History  . Marital status: Widowed    Spouse name: N/A  . Number of children: 2  . Years of education: N/A   Social History Main Topics  . Smoking status: Never Smoker  . Smokeless tobacco: Never Used  . Alcohol use No  . Drug use: No  . Sexual activity: No   Other Topics Concern  . None   Social History Narrative  . None    Outpatient Encounter Prescriptions as of 06/10/2016  Medication Sig  . amLODipine (NORVASC) 5 MG tablet TAKE 1 BY MOUTH DAILY  . aspirin 81 MG tablet Take 81 mg by mouth daily.  . Blood Glucose Monitoring Suppl (ONETOUCH VERIO IQ SYSTEM) W/DEVICE KIT Use as directed Dx: E11.9 (Patient taking differently: Check sugars twice a day, twice a week Dx: E11.9)  . Calcium Carbonate-Vitamin D (CALCIUM 600+D) 600-400 MG-UNIT per tablet Take 1 tablet by mouth 3 (three) times daily.   . cetirizine (ZYRTEC) 10 MG tablet Take 10 mg by  mouth daily.  . Chromium-Cinnamon (CINNAMON PLUS CHROMIUM PO) Take by mouth.  . Cyanocobalamin (VITAMIN B-12 IJ) Inject as directed once. monthly  . desoximetasone (TOPICORT) 0.05 % cream   . dorzolamide-timolol (COSOPT) 22.3-6.8 MG/ML ophthalmic solution Place 2 drops into the left eye 2 (two) times daily.   Marland Kitchen gabapentin (NEURONTIN) 100 MG capsule TAKE 1 CAPSULE EVERY MORNING  AND TAKE 2 CAPSULES AT BEDTIME  . glucose blood (ONETOUCH VERIO) test strip Check sugar daily Dx: E11.9  . Lancets 30G MISC One touch delica; to check blood sugars twice daily; E11.9  . latanoprost (XALATAN) 0.005 % ophthalmic solution Place 1 drop into the left eye at bedtime.  Marland Kitchen levothyroxine (SYNTHROID, LEVOTHROID) 50 MCG tablet Take 1 tablet (50 mcg total) by mouth daily.  Marland Kitchen omeprazole (PRILOSEC) 20 MG capsule Take 1 capsule (20 mg total) by mouth 2 (two) times daily.  . simvastatin (ZOCOR) 10 MG  tablet TAKE 1 TABLET EVERY DAY  . temazepam (RESTORIL) 30 MG capsule Take 1 capsule (30 mg total) by mouth at bedtime as needed.  . zoledronic acid (RECLAST) 5 MG/100ML SOLN Inject 5 mg into the vein once.  . [DISCONTINUED] omeprazole (PRILOSEC) 20 MG capsule Take 20 mg by mouth 2 (two) times daily.  . tizanidine (ZANAFLEX) 2 MG capsule Take 1 capsule (2 mg total) by mouth at bedtime as needed for muscle spasms.   No facility-administered encounter medications on file as of 06/10/2016.     Review of Systems  Constitutional: Negative for appetite change and unexpected weight change.  HENT: Negative for congestion and sinus pressure.   Respiratory: Negative for cough, chest tightness and shortness of breath.   Cardiovascular: Negative for chest pain, palpitations and leg swelling.  Gastrointestinal: Negative for abdominal pain, nausea and vomiting.       Loose stool as outlined.  Trouble swallowing.    Genitourinary: Negative for difficulty urinating and dysuria.  Musculoskeletal: Negative for joint swelling and myalgias.  Skin: Negative for color change and rash.  Neurological: Negative for dizziness, light-headedness and headaches.  Psychiatric/Behavioral: Negative for agitation and dysphoric mood.       Objective:     Blood pressure rechecked by me:  132/78  Physical Exam  Constitutional: She appears well-developed and well-nourished. No distress.  HENT:  Nose: Nose normal.  Mouth/Throat: Oropharynx is clear and moist.  Neck: Neck supple. No thyromegaly present.  Cardiovascular: Normal rate and regular rhythm.   Pulmonary/Chest: Breath sounds normal. No respiratory distress. She has no wheezes.  Abdominal: Soft. Bowel sounds are normal. There is no tenderness.  Musculoskeletal: She exhibits no edema or tenderness.  Lymphadenopathy:    She has no cervical adenopathy.  Skin: No rash noted. No erythema.  Psychiatric: She has a normal mood and affect. Her behavior is normal.     BP 136/76 (BP Location: Right Arm, Patient Position: Sitting, Cuff Size: Large)   Pulse 60   Temp 98.4 F (36.9 C) (Oral)   Ht _0  (1.626 m)   Wt 158 lb (71.7 kg)   SpO2 94%   BMI 27.12 kg/m  Wt Readings from Last 3 Encounters:  06/10/16 158 lb (71.7 kg)  05/05/16 160 lb 6.4 oz (72.8 kg)  01/31/16 161 lb (73 kg)     Lab Results  Component Value Date   WBC 7.5 05/29/2016   HGB 14.0 05/29/2016   HCT 42.3 05/29/2016   PLT 273.0 05/29/2016   GLUCOSE 107 (H) 05/29/2016   CHOL 188 05/29/2016  TRIG 65.0 05/29/2016   HDL 71.90 05/29/2016   LDLCALC 103 (H) 05/29/2016   ALT 13 05/29/2016   AST 15 05/29/2016   NA 143 05/29/2016   K 4.7 05/29/2016   CL 105 05/29/2016   CREATININE 0.86 05/29/2016   BUN 12 05/29/2016   CO2 33 (H) 05/29/2016   TSH 2.88 09/24/2015   HGBA1C 6.7 (H) 05/29/2016   MICROALBUR <0.7 05/29/2016       Assessment & Plan:   Problem List Items Addressed This Visit    Diabetes ([place])    Low carb diet and exercise.  Follow met b and a1c.        Relevant Orders   Hemoglobin A1c   GERD (gastroesophageal reflux disease)    Has been controlled on prilosec.  Some swallowing issues now.  prilosec bid.  If persistent swallowing issues, refer to GI.        Relevant Medications   omeprazole (PRILOSEC) 20 MG capsule   Hypercholesterolemia    Low cholesterol diet and exercise.  On simvastatin.  Follow lipid panel and liver function tests.        Relevant Orders   Hepatic function panel   Lipid panel   Hypertension    Blood pressure under good control.  Continue same medication regimen.  Follow pressures.  Follow metabolic panel.        Relevant Orders   Basic metabolic panel   Hypothyroidism    On thyroid replacement.  Follow tsh.       Relevant Orders   TSH   Peripheral neuropathy ([facility])     On gabapentin.        Relevant Medications   tizanidine (ZANAFLEX) 2 MG capsule    Other Visit Diagnoses    Neck pain    -  Primary   left neck  pain as outlined.  limited rom.  taking alleve has helped.  tizanidine as directed.  follow.  notify me if persistent.     Loose stools       probiotic as directed.  follow.        Einar Pheasant, MD

## 2016-06-17 ENCOUNTER — Ambulatory Visit: Payer: Self-pay

## 2016-06-22 ENCOUNTER — Encounter: Payer: Self-pay | Admitting: Internal Medicine

## 2016-06-22 NOTE — Assessment & Plan Note (Signed)
Low carb diet and exercise.  Follow met b and a1c.   

## 2016-06-22 NOTE — Assessment & Plan Note (Addendum)
Has been controlled on prilosec.  Some swallowing issues now.  prilosec bid.  If persistent swallowing issues, refer to GI.

## 2016-06-22 NOTE — Assessment & Plan Note (Signed)
Blood pressure under good control.  Continue same medication regimen.  Follow pressures.  Follow metabolic panel.   

## 2016-06-22 NOTE — Assessment & Plan Note (Signed)
Low cholesterol diet and exercise.  On simvastatin.  Follow lipid panel and liver function tests.   

## 2016-06-22 NOTE — Assessment & Plan Note (Signed)
On gabapentin.  

## 2016-06-22 NOTE — Assessment & Plan Note (Signed)
On thyroid replacement.  Follow tsh.  

## 2016-06-24 ENCOUNTER — Other Ambulatory Visit: Payer: Self-pay | Admitting: Internal Medicine

## 2016-06-24 ENCOUNTER — Ambulatory Visit (INDEPENDENT_AMBULATORY_CARE_PROVIDER_SITE_OTHER): Payer: Medicare Other | Admitting: *Deleted

## 2016-06-24 DIAGNOSIS — E538 Deficiency of other specified B group vitamins: Secondary | ICD-10-CM | POA: Diagnosis not present

## 2016-06-24 DIAGNOSIS — Z872 Personal history of diseases of the skin and subcutaneous tissue: Secondary | ICD-10-CM | POA: Diagnosis not present

## 2016-06-24 DIAGNOSIS — B078 Other viral warts: Secondary | ICD-10-CM | POA: Diagnosis not present

## 2016-06-24 MED ORDER — CYANOCOBALAMIN 1000 MCG/ML IJ SOLN
1000.0000 ug | Freq: Once | INTRAMUSCULAR | Status: AC
  Administered 2016-06-24: 1000 ug via INTRAMUSCULAR

## 2016-06-24 NOTE — Progress Notes (Addendum)
Patient presented for B 12 injection to Left deltoid, patient tolerated well.  Reviewed.  Dr Nicki Reaper

## 2016-06-25 MED ORDER — GLUCOSE BLOOD VI STRP: ORAL_STRIP | 5 refills | Status: DC

## 2016-06-27 ENCOUNTER — Other Ambulatory Visit: Payer: Self-pay | Admitting: Internal Medicine

## 2016-07-09 ENCOUNTER — Other Ambulatory Visit: Payer: Self-pay | Admitting: Internal Medicine

## 2016-07-15 DIAGNOSIS — B078 Other viral warts: Secondary | ICD-10-CM | POA: Diagnosis not present

## 2016-07-29 ENCOUNTER — Ambulatory Visit (INDEPENDENT_AMBULATORY_CARE_PROVIDER_SITE_OTHER): Payer: Medicare Other | Admitting: *Deleted

## 2016-07-29 DIAGNOSIS — E538 Deficiency of other specified B group vitamins: Secondary | ICD-10-CM | POA: Diagnosis not present

## 2016-07-29 MED ORDER — CYANOCOBALAMIN 1000 MCG/ML IJ SOLN
1000.0000 ug | Freq: Once | INTRAMUSCULAR | Status: AC
  Administered 2016-07-29: 1000 ug via INTRAMUSCULAR

## 2016-07-29 NOTE — Progress Notes (Addendum)
Patient presented fro monthly B 12 injection to right deltoid patient voiced no concerns or showed any signs of distress during injection.  Reviewed.  Dr Nicki Reaper

## 2016-08-05 DIAGNOSIS — B078 Other viral warts: Secondary | ICD-10-CM | POA: Diagnosis not present

## 2016-08-20 ENCOUNTER — Encounter: Payer: Self-pay | Admitting: Internal Medicine

## 2016-09-01 DIAGNOSIS — B078 Other viral warts: Secondary | ICD-10-CM | POA: Diagnosis not present

## 2016-09-02 ENCOUNTER — Ambulatory Visit (INDEPENDENT_AMBULATORY_CARE_PROVIDER_SITE_OTHER): Payer: Medicare Other | Admitting: *Deleted

## 2016-09-02 DIAGNOSIS — E538 Deficiency of other specified B group vitamins: Secondary | ICD-10-CM

## 2016-09-02 MED ORDER — CYANOCOBALAMIN 1000 MCG/ML IJ SOLN
1000.0000 ug | Freq: Once | INTRAMUSCULAR | Status: AC
  Administered 2016-09-02: 1000 ug via INTRAMUSCULAR

## 2016-09-02 NOTE — Progress Notes (Addendum)
Patient presented for B 12 injection to left deltoid, Patient voiced no concern during injection or showed any sign of distress.   Reviewed.  Dr Nicki Reaper

## 2016-09-16 DIAGNOSIS — H401133 Primary open-angle glaucoma, bilateral, severe stage: Secondary | ICD-10-CM | POA: Diagnosis not present

## 2016-09-29 ENCOUNTER — Other Ambulatory Visit: Payer: Self-pay | Admitting: Internal Medicine

## 2016-09-29 DIAGNOSIS — Z1231 Encounter for screening mammogram for malignant neoplasm of breast: Secondary | ICD-10-CM

## 2016-10-07 ENCOUNTER — Ambulatory Visit (INDEPENDENT_AMBULATORY_CARE_PROVIDER_SITE_OTHER): Payer: Medicare Other | Admitting: *Deleted

## 2016-10-07 DIAGNOSIS — E538 Deficiency of other specified B group vitamins: Secondary | ICD-10-CM | POA: Diagnosis not present

## 2016-10-07 MED ORDER — CYANOCOBALAMIN 1000 MCG/ML IJ SOLN
1000.0000 ug | Freq: Once | INTRAMUSCULAR | Status: AC
  Administered 2016-10-07: 1000 ug via INTRAMUSCULAR

## 2016-10-07 NOTE — Progress Notes (Signed)
Patient presented for B 12 injection to right deltoid, patient voiced no concerns nor showed any signs of distress during injection. 

## 2016-10-16 ENCOUNTER — Other Ambulatory Visit: Payer: Self-pay | Admitting: Internal Medicine

## 2016-10-20 ENCOUNTER — Other Ambulatory Visit (INDEPENDENT_AMBULATORY_CARE_PROVIDER_SITE_OTHER): Payer: Medicare Other

## 2016-10-20 DIAGNOSIS — E7800 Pure hypercholesterolemia, unspecified: Secondary | ICD-10-CM

## 2016-10-20 DIAGNOSIS — E119 Type 2 diabetes mellitus without complications: Secondary | ICD-10-CM | POA: Diagnosis not present

## 2016-10-20 DIAGNOSIS — E039 Hypothyroidism, unspecified: Secondary | ICD-10-CM | POA: Diagnosis not present

## 2016-10-20 DIAGNOSIS — I10 Essential (primary) hypertension: Secondary | ICD-10-CM

## 2016-10-20 LAB — HEPATIC FUNCTION PANEL
ALBUMIN: 4.2 g/dL (ref 3.5–5.2)
ALT: 12 U/L (ref 0–35)
AST: 15 U/L (ref 0–37)
Alkaline Phosphatase: 49 U/L (ref 39–117)
Bilirubin, Direct: 0.2 mg/dL (ref 0.0–0.3)
Total Bilirubin: 0.6 mg/dL (ref 0.2–1.2)
Total Protein: 7.5 g/dL (ref 6.0–8.3)

## 2016-10-20 LAB — LIPID PANEL
CHOLESTEROL: 162 mg/dL (ref 0–200)
HDL: 62.3 mg/dL (ref >39.00)
LDL Cholesterol: 86 mg/dL (ref 0–99)
NonHDL: 99.7
TRIGLYCERIDES: 68 mg/dL (ref 0.0–149.0)
Total CHOL/HDL Ratio: 3
VLDL: 13.6 mg/dL (ref 0.0–40.0)

## 2016-10-20 LAB — BASIC METABOLIC PANEL
BUN: 18 mg/dL (ref 6–23)
CALCIUM: 9.6 mg/dL (ref 8.4–10.5)
CHLORIDE: 105 meq/L (ref 96–112)
CO2: 28 meq/L (ref 19–32)
CREATININE: 0.87 mg/dL (ref 0.40–1.20)
GFR: 65.77 mL/min (ref >60.00)
Glucose, Bld: 107 mg/dL — ABNORMAL HIGH (ref 70–99)
Potassium: 4.2 mEq/L (ref 3.5–5.1)
Sodium: 141 mEq/L (ref 135–145)

## 2016-10-20 LAB — HEMOGLOBIN A1C: Hgb A1c MFr Bld: 6.6 % — ABNORMAL HIGH (ref 4.6–6.5)

## 2016-10-20 LAB — TSH: TSH: 1.68 u[IU]/mL (ref 0.35–4.50)

## 2016-10-21 ENCOUNTER — Encounter: Payer: Self-pay | Admitting: Internal Medicine

## 2016-10-23 ENCOUNTER — Ambulatory Visit (INDEPENDENT_AMBULATORY_CARE_PROVIDER_SITE_OTHER): Payer: Medicare Other | Admitting: Internal Medicine

## 2016-10-23 ENCOUNTER — Encounter: Payer: Self-pay | Admitting: Internal Medicine

## 2016-10-23 DIAGNOSIS — Z0000 Encounter for general adult medical examination without abnormal findings: Secondary | ICD-10-CM

## 2016-10-23 DIAGNOSIS — Z853 Personal history of malignant neoplasm of breast: Secondary | ICD-10-CM | POA: Diagnosis not present

## 2016-10-23 DIAGNOSIS — K2100 Gastro-esophageal reflux disease with esophagitis, without bleeding: Secondary | ICD-10-CM

## 2016-10-23 DIAGNOSIS — G6289 Other specified polyneuropathies: Secondary | ICD-10-CM | POA: Diagnosis not present

## 2016-10-23 DIAGNOSIS — E1142 Type 2 diabetes mellitus with diabetic polyneuropathy: Secondary | ICD-10-CM

## 2016-10-23 DIAGNOSIS — I10 Essential (primary) hypertension: Secondary | ICD-10-CM

## 2016-10-23 DIAGNOSIS — E039 Hypothyroidism, unspecified: Secondary | ICD-10-CM | POA: Diagnosis not present

## 2016-10-23 DIAGNOSIS — E7800 Pure hypercholesterolemia, unspecified: Secondary | ICD-10-CM | POA: Diagnosis not present

## 2016-10-23 DIAGNOSIS — E119 Type 2 diabetes mellitus without complications: Secondary | ICD-10-CM | POA: Diagnosis not present

## 2016-10-23 DIAGNOSIS — G479 Sleep disorder, unspecified: Secondary | ICD-10-CM

## 2016-10-23 LAB — HM DIABETES FOOT EXAM

## 2016-10-23 MED ORDER — TEMAZEPAM 15 MG PO CAPS
15.0000 mg | ORAL_CAPSULE | Freq: Every evening | ORAL | 0 refills | Status: DC | PRN
Start: 2016-10-23 — End: 2017-07-24

## 2016-10-23 NOTE — Assessment & Plan Note (Signed)
Physical today 10/23/16.  Mammogram 11/12/15 (left) Birads I.  S/p right mastectomy.  Colonoscopy 08/20/09.

## 2016-10-23 NOTE — Progress Notes (Signed)
Patient ID: Julie Hoover, female   DOB: Feb 12, 1930, 83 y.o.   MRN: [id number]   Subjective:    Patient ID: Julie Hoover, female    DOB: 1929-12-21, 83 y.o.   MRN: [id number]  HPI  Patient with past history of breast cancer, diabetes, hypertension and hypercholesterolemia.  She comes in today to follow up on these issues as well as for a complete physical exam.  She reports she is doing relatively well.  Tries to stay active.  No chest pain.  No sob.  No acid reflux.  No abdominal pain.  Bowels moving.  Some decreased weight.  Trying to watch her diet.  Had two teeth pulled recently.  Had abscess.  Previously on abx.  Better.  She uses restoril prn.  This does help her sleep.  When she takes, she feels a little sluggish the next morning.  She waits to take the medication after a few hours of lying in the bed and not sleeping.  Discussed bone density.     Past Medical History:  Diagnosis Date  . Arthritis   . Breast cancer ([place]) 1986   s/p right lumpectomy, XRT and Tamoxifen  . Cancer ([place])    skin ca  . Chicken pox   . Colon polyps   . Diabetes ([place])   . Diverticulosis   . Environmental allergies   . GERD (gastroesophageal reflux disease)   . Glaucoma   . Hypercholesterolemia   . Hyperglycemia   . Hypertension   . Hypothyroidism   . Osteopenia   . Peripheral neuropathy   . Urethral stricture    Past Surgical History:  Procedure Laterality Date  . BREAST BIOPSY Right 6/86   lumpectomy-positive/rad  . BREAST LUMPECTOMY  1986   right  . ESOPHAGOGASTRODUODENOSCOPY (EGD) WITH PROPOFOL N/A 12/17/2015   Procedure: ESOPHAGOGASTRODUODENOSCOPY (EGD) WITH PROPOFOL;  Surgeon: Lollie Sails, MD;  Location: [facility] ENDOSCOPY;  Service: Endoscopy;  Laterality: N/A;  . EYE SURGERY     Bilateral cataracts removed  . KNEE SURGERY  06/2008   torn meniscus, right  . KNEE SURGERY  2012   torn menisucs, right  . MASTECTOMY  1993   right, recurrence  . TUBAL LIGATION    .  URETHRAL DILATION     Family History  Problem Relation Age of Onset  . Heart disease Father   . Hypertension Father   . COPD Mother   . Hypertension Brother        x2  . Hypercholesterolemia Brother        x2  . Hypertension Sister   . Hypercholesterolemia Sister   . Diabetes Mellitus II Sister   . Cancer Unknown        mouth, aunt  . Colon cancer Neg Hx   . Breast cancer Neg Hx    Social History   Social History  . Marital status: Widowed    Spouse name: N/A  . Number of children: 2  . Years of education: N/A   Social History Main Topics  . Smoking status: Never Smoker  . Smokeless tobacco: Never Used  . Alcohol use No  . Drug use: No  . Sexual activity: No   Other Topics Concern  . None   Social History Narrative  . None    Outpatient Encounter Prescriptions as of 10/23/2016  Medication Sig  . amLODipine (NORVASC) 5 MG tablet TAKE 1 TABLET EVERY DAY  . aspirin 81 MG tablet Take 81 mg by mouth daily.  Marland Kitchen  Blood Glucose Monitoring Suppl (ONETOUCH VERIO IQ SYSTEM) W/DEVICE KIT Use as directed Dx: E11.9 (Patient taking differently: Check sugars twice a day, twice a week Dx: E11.9)  . Calcium Carbonate-Vitamin D (CALCIUM 600+D) 600-400 MG-UNIT per tablet Take 1 tablet by mouth 3 (three) times daily.   . cetirizine (ZYRTEC) 10 MG tablet Take 10 mg by mouth daily.  . Chromium-Cinnamon (CINNAMON PLUS CHROMIUM PO) Take by mouth.  . Cyanocobalamin (VITAMIN B-12 IJ) Inject as directed once. monthly  . desoximetasone (TOPICORT) 0.05 % cream   . dorzolamide-timolol (COSOPT) 22.3-6.8 MG/ML ophthalmic solution Place 1 drop into the left eye 2 (two) times daily.   Marland Kitchen gabapentin (NEURONTIN) 100 MG capsule TAKE 1 CAPSULE EVERY MORNING AND TAKE 2 CAPSULES AT BEDTIME  . glucose blood (ONETOUCH VERIO) test strip Check sugar daily Dx: E11.9  . Lancets 30G MISC One touch delica; to check blood sugars twice daily; E11.9  . latanoprost (XALATAN) 0.005 % ophthalmic solution Place 1 drop  into the left eye at bedtime.  Marland Kitchen levothyroxine (SYNTHROID, LEVOTHROID) 50 MCG tablet TAKE 1 TABLET EVERY DAY  . omeprazole (PRILOSEC) 20 MG capsule Take 1 capsule (20 mg total) by mouth 2 (two) times daily.  . simvastatin (ZOCOR) 10 MG tablet TAKE 1 TABLET EVERY DAY  . tizanidine (ZANAFLEX) 2 MG capsule Take 1 capsule (2 mg total) by mouth at bedtime as needed for muscle spasms.  . [DISCONTINUED] temazepam (RESTORIL) 30 MG capsule Take 1 capsule (30 mg total) by mouth at bedtime as needed.  . temazepam (RESTORIL) 15 MG capsule Take 1 capsule (15 mg total) by mouth at bedtime as needed for sleep.  Marland Kitchen zoledronic acid (RECLAST) 5 MG/100ML SOLN Inject 5 mg into the vein once.   No facility-administered encounter medications on file as of 10/23/2016.     Review of Systems  Constitutional: Negative for appetite change.       Has lost some weight.    HENT: Negative for congestion and sinus pressure.   Eyes: Negative for pain and visual disturbance.  Respiratory: Negative for cough, chest tightness and shortness of breath.   Cardiovascular: Negative for chest pain, palpitations and leg swelling.  Gastrointestinal: Negative for abdominal pain, diarrhea, nausea and vomiting.  Genitourinary: Negative for difficulty urinating and dysuria.  Musculoskeletal: Negative for joint swelling and myalgias.  Skin: Negative for color change and rash.  Neurological: Negative for dizziness, light-headedness and headaches.  Hematological: Negative for adenopathy. Does not bruise/bleed easily.  Psychiatric/Behavioral: Positive for sleep disturbance. Negative for agitation and dysphoric mood.       Objective:     Blood pressure rechecked by me:  136/72  Physical Exam  Constitutional: She is oriented to person, place, and time. She appears well-developed and well-nourished. No distress.  HENT:  Nose: Nose normal.  Mouth/Throat: Oropharynx is clear and moist.  Eyes: Right eye exhibits no discharge. Left eye  exhibits no discharge. No scleral icterus.  Neck: Neck supple. No thyromegaly present.  Cardiovascular: Normal rate and regular rhythm.   Pulmonary/Chest: Breath sounds normal. No accessory muscle usage. No tachypnea. No respiratory distress. She has no decreased breath sounds. She has no wheezes. She has no rhonchi. Right breast exhibits no inverted nipple, no mass, no nipple discharge and no tenderness (no axillary adenopathy). Left breast exhibits no inverted nipple, no mass, no nipple discharge and no tenderness (no axilarry adenopathy).  Abdominal: Soft. Bowel sounds are normal. There is no tenderness.  Musculoskeletal: She exhibits no edema or tenderness.  Lymphadenopathy:  She has no cervical adenopathy.  Neurological: She is alert and oriented to person, place, and time.  Feet:  Intact to light touch and pinprick.  No lesions.  DP pulses palpable and equal bilaterally.    Skin: Skin is warm. No rash noted. No erythema.  Psychiatric: She has a normal mood and affect. Her behavior is normal.    BP 130/72 (BP Location: Left Arm, Patient Position: Sitting, Cuff Size: Normal)   Pulse 74   Temp 98.6 F (37 C) (Oral)   Resp 12   Ht _0  (1.626 m)   Wt 154 lb 6.4 oz (70 kg)   SpO2 95%   BMI 26.50 kg/m  Wt Readings from Last 3 Encounters:  10/23/16 154 lb 6.4 oz (70 kg)  06/10/16 158 lb (71.7 kg)  05/05/16 160 lb 6.4 oz (72.8 kg)     Lab Results  Component Value Date   WBC 7.5 05/29/2016   HGB 14.0 05/29/2016   HCT 42.3 05/29/2016   PLT 273.0 05/29/2016   GLUCOSE 107 (H) 10/20/2016   CHOL 162 10/20/2016   TRIG 68.0 10/20/2016   HDL 62.30 10/20/2016   LDLCALC 86 10/20/2016   ALT 12 10/20/2016   AST 15 10/20/2016   NA 141 10/20/2016   K 4.2 10/20/2016   CL 105 10/20/2016   CREATININE 0.87 10/20/2016   BUN 18 10/20/2016   CO2 28 10/20/2016   TSH 1.68 10/20/2016   HGBA1C 6.6 (H) 10/20/2016   MICROALBUR <0.7 05/29/2016       Assessment & Plan:   Problem List  Items Addressed This Visit    Diabetes ([place])    Low carb diet and exercise.  Follow met b and a1c.  Up to date with eye checks.       Relevant Orders   Hemoglobin A1c   GERD (gastroesophageal reflux disease)    On prilosec.  No issues reported now.  Follow.        Health care maintenance    Physical today 10/23/16.  Mammogram 11/12/15 (left) Birads I.  S/p right mastectomy.  Colonoscopy 08/20/09.        History of breast cancer    Mammogram 11/12/15 - Birads I.        Hypercholesterolemia    Low cholesterol diet and exercise.  On simvastatin.  Follow lipid panel and liver function tests.        Relevant Orders   Lipid panel   Hepatic function panel   Hypertension    Blood pressure under good control.  Continue same medication regimen.  Follow pressures.  Follow metabolic panel.        Relevant Orders   Basic metabolic panel   Hypothyroidism    On thyroid replacement.  Follow tsh.        Peripheral neuropathy    On gabapentin.        Relevant Medications   temazepam (RESTORIL) 15 MG capsule   Sleep disturbance    Uses restoril prn.  When uses, she feels sluggish the next morning.  Taking after in bed a few hours.  Discussed taking the medication earlier.  Discussed changing the medication.  She has tried multiple medications.  restoril works best for her.  Will decrease restoril to 81m.  Follow.            SEinar Pheasant MD

## 2016-10-23 NOTE — Progress Notes (Signed)
Pre-visit discussion using our clinic review tool. No additional management support is needed unless otherwise documented below in the visit note.  

## 2016-10-25 ENCOUNTER — Encounter: Payer: Self-pay | Admitting: Internal Medicine

## 2016-10-25 DIAGNOSIS — G479 Sleep disorder, unspecified: Secondary | ICD-10-CM | POA: Insufficient documentation

## 2016-10-25 NOTE — Assessment & Plan Note (Signed)
On prilosec.  No issues reported now.  Follow.

## 2016-10-25 NOTE — Assessment & Plan Note (Signed)
On thyroid replacement.  Follow tsh.  

## 2016-10-25 NOTE — Assessment & Plan Note (Signed)
Uses restoril prn.  When uses, she feels sluggish the next morning.  Taking after in bed a few hours.  Discussed taking the medication earlier.  Discussed changing the medication.  She has tried multiple medications.  restoril works best for her.  Will decrease restoril to 15mg .  Follow.

## 2016-10-25 NOTE — Assessment & Plan Note (Addendum)
Low carb diet and exercise.  Follow met b and a1c.  Up to date with eye checks.   

## 2016-10-25 NOTE — Assessment & Plan Note (Signed)
Low cholesterol diet and exercise.  On simvastatin.  Follow lipid panel and liver function tests.   

## 2016-10-25 NOTE — Assessment & Plan Note (Signed)
On gabapentin.  

## 2016-10-25 NOTE — Assessment & Plan Note (Signed)
Blood pressure under good control.  Continue same medication regimen.  Follow pressures.  Follow metabolic panel.   

## 2016-10-25 NOTE — Assessment & Plan Note (Signed)
Mammogram 11/12/15 - Birads I.

## 2016-10-28 ENCOUNTER — Ambulatory Visit (INDEPENDENT_AMBULATORY_CARE_PROVIDER_SITE_OTHER): Payer: Medicare Other

## 2016-10-28 ENCOUNTER — Ambulatory Visit: Payer: Self-pay

## 2016-10-28 VITALS — BP 118/64 | HR 65 | Temp 98.1°F | Resp 14 | Ht 64.0 in | Wt 154.8 lb

## 2016-10-28 DIAGNOSIS — Z0000 Encounter for general adult medical examination without abnormal findings: Secondary | ICD-10-CM | POA: Diagnosis not present

## 2016-10-28 DIAGNOSIS — Z1389 Encounter for screening for other disorder: Secondary | ICD-10-CM

## 2016-10-28 DIAGNOSIS — Z1331 Encounter for screening for depression: Secondary | ICD-10-CM

## 2016-10-28 NOTE — Progress Notes (Signed)
Subjective:   Julie Hoover is a 83 y.o. female who presents for Medicare Annual (Subsequent) preventive examination.  Review of Systems:  No ROS.  Medicare Wellness Visit. Additional risk factors are reflected in the social history.  Cardiac Risk Factors include: advanced age (>72mn, >>54women);diabetes mellitus;hypertension     Objective:     Vitals: BP 118/64 (BP Location: Left Arm, Patient Position: Sitting, Cuff Size: Normal)   Pulse 65   Temp 98.1 F (36.7 C) (Oral)   Resp 14   Ht 5' 4"  (1.626 m)   Wt 154 lb 12.8 oz (70.2 kg)   SpO2 95%   BMI 26.57 kg/m   Body mass index is 26.57 kg/m.   Tobacco History  Smoking Status  . Never Smoker  Smokeless Tobacco  . Never Used     Counseling given: Not Answered   Past Medical History:  Diagnosis Date  . Arthritis   . [place]   s/p right lumpectomy, XRT and Tamoxifen  [place]    [place]  . Chicken pox   . [place] polyps   . [place]   . Diverticulosis   . Environmental allergies   . GERD (gastroesophageal reflux disease)   . Glaucoma   . Hypercholesterolemia   . Hyperglycemia   . Hypertension   . Hypothyroidism   . Osteopenia   . Peripheral neuropathy   . Urethral stricture    Past Surgical History:  Procedure Laterality Date  . BREAST BIOPSY Right 6/86   lumpectomy-positive/rad  . BREAST LUMPECTOMY  1986   right  . ESOPHAGOGASTRODUODENOSCOPY (EGD) WITH PROPOFOL N/A 12/17/2015   Procedure: ESOPHAGOGASTRODUODENOSCOPY (EGD) WITH PROPOFOL;  Surgeon: MLollie Sails MD;  [facility];  Service: Endoscopy;  Laterality: N/A;  . EYE SURGERY     Bilateral cataracts removed  . KNEE SURGERY  06/2008   torn meniscus, right  . KNEE SURGERY  2012   torn menisucs, right  . MASTECTOMY  1993   right, recurrence  . TUBAL LIGATION    . URETHRAL DILATION     Family History  Problem Relation Age of Onset  . Heart disease Father   . Hypertension Father   .  COPD Mother   . Hypertension Brother        x2  . Hypercholesterolemia Brother        x2  . Hypertension Sister   . Hypercholesterolemia Sister   . Diabetes Mellitus II Sister   . Cancer Unknown        mouth, aunt  . Colon cancer Neg Hx   . Breast cancer Neg Hx    History  Sexual Activity  . Sexual activity: No    Outpatient Encounter Prescriptions as of 10/28/2016  Medication Sig  . amLODipine (NORVASC) 5 MG tablet TAKE 1 TABLET EVERY DAY  . aspirin 81 MG tablet Take 81 mg by mouth daily.  . Blood Glucose Monitoring Suppl (ONETOUCH VERIO IQ SYSTEM) W/DEVICE KIT Use as directed Dx: E11.9 (Patient taking differently: Check sugars twice a day, twice a week Dx: E11.9)  . Calcium Carbonate-Vitamin D (CALCIUM 600+D) 600-400 MG-UNIT per tablet Take 1 tablet by mouth 3 (three) times daily.   . cetirizine (ZYRTEC) 10 MG tablet Take 10 mg by mouth daily.  . Chromium-Cinnamon (CINNAMON PLUS CHROMIUM PO) Take by mouth.  . Cyanocobalamin (VITAMIN B-12 IJ) Inject as directed once. monthly  . desoximetasone (TOPICORT) 0.05 % cream   . dorzolamide-timolol (COSOPT) 22.3-6.8  MG/ML ophthalmic solution Place 1 drop into the left eye 2 (two) times daily.   Marland Kitchen gabapentin (NEURONTIN) 100 MG capsule TAKE 1 CAPSULE EVERY MORNING AND TAKE 2 CAPSULES AT BEDTIME  . glucose blood (ONETOUCH VERIO) test strip Check sugar daily Dx: E11.9  . Lancets 30G MISC One touch delica; to check blood sugars twice daily; E11.9  . latanoprost (XALATAN) 0.005 % ophthalmic solution Place 1 drop into the left eye at bedtime.  Marland Kitchen levothyroxine (SYNTHROID, LEVOTHROID) 50 MCG tablet TAKE 1 TABLET EVERY DAY  . omeprazole (PRILOSEC) 20 MG capsule Take 1 capsule (20 mg total) by mouth 2 (two) times daily.  . simvastatin (ZOCOR) 10 MG tablet TAKE 1 TABLET EVERY DAY  . temazepam (RESTORIL) 15 MG capsule Take 1 capsule (15 mg total) by mouth at bedtime as needed for sleep.  . tizanidine (ZANAFLEX) 2 MG capsule Take 1 capsule (2 mg total)  by mouth at bedtime as needed for muscle spasms.  . zoledronic acid (RECLAST) 5 MG/100ML SOLN Inject 5 mg into the vein once.   No facility-administered encounter medications on file as of 10/28/2016.     Activities of Daily Living In your present state of health, do you have any difficulty performing the following activities: 10/28/2016 10/29/2015  Hearing? N N  Vision? N N  Difficulty concentrating or making decisions? N N  Walking or climbing stairs? N Y  Dressing or bathing? N N  Doing errands, shopping? N N  Preparing Food and eating ? N N  Using the Toilet? N N  In the past six months, have you accidently leaked urine? N N  Do you have problems with loss of bowel control? N N  Managing your Medications? N N  Managing your Finances? N N  Housekeeping or managing your Housekeeping? N N  Some recent data might be hidden    Patient Care Team: Einar Pheasant, MD as PCP - General (Internal Medicine)    Assessment:    This is a routine wellness examination for [facility]. The goal of the wellness visit is to assist the patient how to close the gaps in care and create a preventative care plan for the patient.   The roster of all physicians providing medical care to patient is listed in the Snapshot section of the chart.  Taking calcium VIT D as appropriate/Osteoporosis risk reviewed.    Safety issues reviewed; Alarm and smoke and carbon monoxide detectors in the home. No firearms in the home.  Wears seatbelts when driving or riding with others. Patient does wear sunscreen or protective clothing when in direct sunlight. No violence in the home.  Depression- PHQ 2 &9 complete.  No signs/symptoms or verbal communication regarding little pleasure in doing things, feeling down, depressed or hopeless. No changes in sleeping, energy, eating, concentrating.  No thoughts of self harm or harm towards others.  Time spent on this topic is 10 minutes.   Patient is alert, normal appearance,  oriented to person/place/and time.  Correctly identified the president of the [place], recall of 3/3 words, and performing simple calculations. Displays appropriate judgement and can read correct time from watch face.   No new identified risk were noted.  No failures at ADL's or IADL's.    BMI- discussed the importance of a healthy diet, water intake and the benefits of aerobic exercise. Educational material provided.   24 hour diet recall: Breakfast: English muffin , fruit Lunch: Fried chicken, pinto beans Dinner: Peanut butter crackers, watermelon slice Snack: icerceam  scoop Daily fluid intake: 2 cups of caffeine, 2 cups of water, 1 cup juice  Dental- every 9 months.  Eye- Visual acuity not assessed per patient preference since they have regular follow up with the ophthalmologist.  Wears corrective lenses when reading.  Sleep patterns- Sleeps 5-6 hours at night.  Wakes feeling rested.  Rests during the day.  Health maintenance gaps- closed.  Patient Concerns: None at this time. Follow up with PCP as needed. Exercise Activities and Dietary recommendations    Goals    . Increase physical activity          Stay active and start exercising again on the treadmill.  Walk 5 minutes in the morning, 5 minutes in the evening, increase as tolerated.      Fall Risk Fall Risk  05/05/2016 01/31/2016 10/29/2015 10/01/2015 09/27/2014  Falls in the past year? No No No No No   Depression Screen PHQ 2/9 Scores 10/23/2016 05/05/2016 01/31/2016 10/29/2015  PHQ - 2 Score 0 0 0 0  PHQ- 9 Score 0 - - -     Cognitive Function MMSE - Mini Mental State Exam 10/28/2016 10/29/2015  Orientation to time 5 5  Orientation to Place 5 5  Registration 3 3  Attention/ Calculation 5 5  Recall 3 3  Language- name 2 objects 2 2  Language- repeat 1 1  Language- follow 3 step command 3 3  Language- read & follow direction 1 1  Write a sentence 1 1  Copy design 1 1  Total score 30 30        Immunization  History  Administered Date(s) Administered  . Influenza Split 01/18/2012  . Influenza, High Dose Seasonal PF 01/31/2016  . Influenza,inj,Quad PF,36+ Mos 01/12/2013, 12/14/2013, 01/09/2015  . Pneumococcal Conjugate-13 05/17/2013  . Pneumococcal Polysaccharide-23 01/29/2015  . Tdap 01/17/2013  . Zoster 03/26/2006   Screening Tests Health Maintenance  Topic Date Due  . MAMMOGRAM  11/11/2016  . INFLUENZA VACCINE  11/19/2016  . HEMOGLOBIN A1C  04/22/2017  . URINE MICROALBUMIN  05/29/2017  . OPHTHALMOLOGY EXAM  09/02/2017  . FOOT EXAM  10/23/2017  . TETANUS/TDAP  01/18/2023  . DEXA SCAN  Completed  . PNA vac Low Risk Adult  Completed      Plan:    End of life planning; Advance aging; Advanced directives discussed. Copy of current HCPOA/Living Will requested.    I have personally reviewed and noted the following in the patient's chart:   . Medical and social history . Use of alcohol, tobacco or illicit drugs  . Current medications and supplements . Functional ability and status . Nutritional status . Physical activity . Advanced directives . List of other physicians . Hospitalizations, surgeries, and ER visits in previous 12 months . Vitals . Screenings to include cognitive, depression, and falls . Referrals and appointments  In addition, I have reviewed and discussed with patient certain preventive protocols, quality metrics, and best practice recommendations. A written personalized care plan for preventive services as well as general preventive health recommendations were provided to patient.     Varney Biles, LPN  6/95/0722   Reviewed above information.  Agree with assessment and plan.  Dr Nicki Reaper

## 2016-10-28 NOTE — Patient Instructions (Addendum)
  Ms. Julie Hoover , Thank you for taking time to come for your Medicare Wellness Visit. I appreciate your ongoing commitment to your health goals. Please review the following plan we discussed and let me know if I can assist you in the future.   Follow up with Dr. Nicki Reaper as needed.    Bring a copy of your [place] and/or Living Will to be scanned into chart.  Have a great day!  These are the goals we discussed: Goals    . Increase physical activity          Stay active and start exercising again on the treadmill.  Walk 5 minutes in the morning, 5 minutes in the evening, increase as tolerated.       This is a list of the screening recommended for you and due dates:  Health Maintenance  Topic Date Due  . Mammogram  11/11/2016  . Flu Shot  11/19/2016  . Hemoglobin A1C  04/22/2017  . Urine Protein Check  05/29/2017  . Eye exam for diabetics  09/02/2017  . Complete foot exam   10/23/2017  . Tetanus Vaccine  01/18/2023  . DEXA scan (bone density measurement)  Completed  . Pneumonia vaccines  Completed

## 2016-11-11 ENCOUNTER — Ambulatory Visit (INDEPENDENT_AMBULATORY_CARE_PROVIDER_SITE_OTHER): Payer: Medicare Other | Admitting: *Deleted

## 2016-11-11 DIAGNOSIS — E538 Deficiency of other specified B group vitamins: Secondary | ICD-10-CM

## 2016-11-11 MED ORDER — CYANOCOBALAMIN 1000 MCG/ML IJ SOLN
1000.0000 ug | Freq: Once | INTRAMUSCULAR | Status: AC
  Administered 2016-11-11: 1000 ug via INTRAMUSCULAR

## 2016-11-11 NOTE — Progress Notes (Addendum)
Patient presented for B 12 injection to right deltoid, patient voiced no concerns nor showed any signs of distress during injection.  Reviewed.  Dr Scott 

## 2016-11-12 ENCOUNTER — Ambulatory Visit
Admission: RE | Admit: 2016-11-12 | Discharge: 2016-11-12 | Disposition: A | Discharge status: Home / Self Care | Payer: Medicare Other | Source: Ambulatory Visit | Attending: Internal Medicine | Admitting: Internal Medicine

## 2016-11-12 DIAGNOSIS — Z9011 Acquired absence of right breast and nipple: Secondary | ICD-10-CM | POA: Insufficient documentation

## 2016-11-12 DIAGNOSIS — Z1231 Encounter for screening mammogram for malignant neoplasm of breast: Secondary | ICD-10-CM | POA: Insufficient documentation

## 2016-11-12 HISTORY — DX: Personal history of irradiation: Z92.3

## 2016-12-09 ENCOUNTER — Other Ambulatory Visit: Payer: Self-pay | Admitting: Internal Medicine

## 2016-12-16 ENCOUNTER — Ambulatory Visit (INDEPENDENT_AMBULATORY_CARE_PROVIDER_SITE_OTHER): Payer: Medicare Other

## 2016-12-16 DIAGNOSIS — E538 Deficiency of other specified B group vitamins: Secondary | ICD-10-CM

## 2016-12-16 MED ORDER — CYANOCOBALAMIN 1000 MCG/ML IJ SOLN
1000.0000 ug | Freq: Once | INTRAMUSCULAR | Status: AC
  Administered 2016-12-16: 1000 ug via INTRAMUSCULAR

## 2016-12-16 NOTE — Progress Notes (Signed)
Patient comes in for B 12 injection. Injection left deltoid. Patient tolerated injections well.   Reviewed.  Dr Nicki Reaper

## 2016-12-23 DIAGNOSIS — L570 Actinic keratosis: Secondary | ICD-10-CM | POA: Diagnosis not present

## 2016-12-23 DIAGNOSIS — B353 Tinea pedis: Secondary | ICD-10-CM | POA: Diagnosis not present

## 2016-12-23 DIAGNOSIS — D229 Melanocytic nevi, unspecified: Secondary | ICD-10-CM | POA: Diagnosis not present

## 2016-12-23 DIAGNOSIS — H61002 Unspecified perichondritis of left external ear: Secondary | ICD-10-CM | POA: Diagnosis not present

## 2017-01-14 DIAGNOSIS — H401133 Primary open-angle glaucoma, bilateral, severe stage: Secondary | ICD-10-CM | POA: Diagnosis not present

## 2017-01-20 ENCOUNTER — Ambulatory Visit (INDEPENDENT_AMBULATORY_CARE_PROVIDER_SITE_OTHER): Payer: Medicare Other

## 2017-01-20 DIAGNOSIS — Z23 Encounter for immunization: Secondary | ICD-10-CM | POA: Diagnosis not present

## 2017-01-20 NOTE — Progress Notes (Signed)
Duplicate encounter

## 2017-01-23 DIAGNOSIS — H401233 Low-tension glaucoma, bilateral, severe stage: Secondary | ICD-10-CM | POA: Diagnosis not present

## 2017-02-09 DIAGNOSIS — K210 Gastro-esophageal reflux disease with esophagitis: Secondary | ICD-10-CM | POA: Diagnosis not present

## 2017-02-09 DIAGNOSIS — R198 Other specified symptoms and signs involving the digestive system and abdomen: Secondary | ICD-10-CM | POA: Diagnosis not present

## 2017-02-23 ENCOUNTER — Other Ambulatory Visit (INDEPENDENT_AMBULATORY_CARE_PROVIDER_SITE_OTHER): Payer: Medicare Other

## 2017-02-23 DIAGNOSIS — I10 Essential (primary) hypertension: Secondary | ICD-10-CM | POA: Diagnosis not present

## 2017-02-23 DIAGNOSIS — E119 Type 2 diabetes mellitus without complications: Secondary | ICD-10-CM | POA: Diagnosis not present

## 2017-02-23 DIAGNOSIS — E7800 Pure hypercholesterolemia, unspecified: Secondary | ICD-10-CM | POA: Diagnosis not present

## 2017-02-23 LAB — BASIC METABOLIC PANEL
BUN: 17 mg/dL (ref 6–23)
CHLORIDE: 104 meq/L (ref 96–112)
CO2: 29 mEq/L (ref 19–32)
Calcium: 9.7 mg/dL (ref 8.4–10.5)
Creatinine, Ser: 0.94 mg/dL (ref 0.40–1.20)
GFR: 60.1 mL/min (ref >60.00)
GLUCOSE: 100 mg/dL — AB (ref 70–99)
POTASSIUM: 4.4 meq/L (ref 3.5–5.1)
SODIUM: 141 meq/L (ref 135–145)

## 2017-02-23 LAB — HEPATIC FUNCTION PANEL
ALBUMIN: 4.3 g/dL (ref 3.5–5.2)
ALT: 12 U/L (ref 0–35)
AST: 15 U/L (ref 0–37)
Alkaline Phosphatase: 47 U/L (ref 39–117)
BILIRUBIN DIRECT: 0.2 mg/dL (ref 0.0–0.3)
TOTAL PROTEIN: 7.5 g/dL (ref 6.0–8.3)
Total Bilirubin: 0.8 mg/dL (ref 0.2–1.2)

## 2017-02-23 LAB — LIPID PANEL
CHOL/HDL RATIO: 3
Cholesterol: 173 mg/dL (ref 0–200)
HDL: 66.4 mg/dL (ref >39.00)
LDL Cholesterol: 96 mg/dL (ref 0–99)
NONHDL: 106.9
Triglycerides: 56 mg/dL (ref 0.0–149.0)
VLDL: 11.2 mg/dL (ref 0.0–40.0)

## 2017-02-23 LAB — HEMOGLOBIN A1C: Hgb A1c MFr Bld: 6.6 % — ABNORMAL HIGH (ref 4.6–6.5)

## 2017-02-24 ENCOUNTER — Encounter: Payer: Self-pay | Admitting: Internal Medicine

## 2017-02-25 ENCOUNTER — Encounter: Payer: Self-pay | Admitting: Internal Medicine

## 2017-02-25 ENCOUNTER — Ambulatory Visit (INDEPENDENT_AMBULATORY_CARE_PROVIDER_SITE_OTHER): Payer: Medicare Other | Admitting: Internal Medicine

## 2017-02-25 VITALS — BP 124/67 | HR 62 | Temp 98.7°F | Resp 14 | Ht 64.0 in | Wt 159.0 lb

## 2017-02-25 DIAGNOSIS — E538 Deficiency of other specified B group vitamins: Secondary | ICD-10-CM | POA: Diagnosis not present

## 2017-02-25 DIAGNOSIS — E039 Hypothyroidism, unspecified: Secondary | ICD-10-CM

## 2017-02-25 DIAGNOSIS — E119 Type 2 diabetes mellitus without complications: Secondary | ICD-10-CM | POA: Diagnosis not present

## 2017-02-25 DIAGNOSIS — G6289 Other specified polyneuropathies: Secondary | ICD-10-CM | POA: Diagnosis not present

## 2017-02-25 DIAGNOSIS — E7800 Pure hypercholesterolemia, unspecified: Secondary | ICD-10-CM

## 2017-02-25 DIAGNOSIS — I10 Essential (primary) hypertension: Secondary | ICD-10-CM | POA: Diagnosis not present

## 2017-02-25 LAB — VITAMIN B12: Vitamin B-12: 426 pg/mL (ref 211–911)

## 2017-02-25 NOTE — Progress Notes (Signed)
Patient ID: Julie Hoover, female   DOB: 1929-12-27, 83 y.o.   MRN: [id number]   Subjective:    Patient ID: Julie Hoover, female    DOB: 17-Dec-1929, 83 y.o.   MRN: [id number]  HPI  Patient here for a scheduled follow up.  She reports she is doing relatively well.  Trying to stay active.  No chest pain.  No sob.  No acid reflux.  No abdominal pain.  Bowels moving.  Following sugars.  Overall doing well.  AM sugars averaging 90-110 and PM sugars averaging 120-160.  Discussed diet and exercise.  Some fatigue.  Receiving B12 injections.  States does not feel a big difference since starting.     Past Medical History:  Diagnosis Date  . Arthritis   . Breast cancer ([place]) 1986   s/p right lumpectomy, XRT and Tamoxifen, mastectomy  . Cancer ([place])    skin ca  . Chicken pox   . Colon polyps   . Diabetes ([place])   . Diverticulosis   . Environmental allergies   . GERD (gastroesophageal reflux disease)   . Glaucoma   . Hypercholesterolemia   . Hyperglycemia   . Hypertension   . Hypothyroidism   . Osteopenia   . Peripheral neuropathy   . Personal history of radiation therapy 1986  . Urethral stricture    Past Surgical History:  Procedure Laterality Date  . BREAST BIOPSY Right 6/86   lumpectomy-positive/rad  . BREAST LUMPECTOMY  1986   right  . EYE SURGERY     Bilateral cataracts removed  . KNEE SURGERY  06/2008   torn meniscus, right  . KNEE SURGERY  2012   torn menisucs, right  . MASTECTOMY Right 1993   right, recurrence  . TUBAL LIGATION    . URETHRAL DILATION     Family History  Problem Relation Age of Onset  . Heart disease Father   . Hypertension Father   . COPD Mother   . Hypertension Brother        x2  . Hypercholesterolemia Brother        x2  . Hypertension Sister   . Hypercholesterolemia Sister   . Diabetes Mellitus II Sister   . Cancer Unknown        mouth, aunt  . Colon cancer Neg Hx   . Breast cancer Neg Hx    Social History    Socioeconomic History  . Marital status: Widowed    Spouse name: None  . Number of children: 2  . Years of education: None  . Highest education level: None  Social Needs  . Financial resource strain: None  . Food insecurity - worry: None  . Food insecurity - inability: None  . Transportation needs - medical: None  . Transportation needs - non-medical: None  Occupational History  . None  Tobacco Use  . Smoking status: Never Smoker  . Smokeless tobacco: Never Used  Substance and Sexual Activity  . Alcohol use: No    Alcohol/week: 0.0 oz  . Drug use: No  . Sexual activity: No  Other Topics Concern  . None  Social History Narrative  . None    Outpatient Encounter Medications as of 02/25/2017  Medication Sig  . amLODipine (NORVASC) 5 MG tablet TAKE 1 TABLET EVERY DAY  . aspirin 81 MG tablet Take 81 mg by mouth daily.  . Blood Glucose Monitoring Suppl (ONETOUCH VERIO IQ SYSTEM) W/DEVICE KIT Use as directed Dx: E11.9 (Patient taking differently: Check sugars twice  a day, twice a week Dx: E11.9)  . Calcium Carbonate-Vitamin D (CALCIUM 600+D) 600-400 MG-UNIT per tablet Take 1 tablet 3 (three) times daily by mouth.   . cetirizine (ZYRTEC) 10 MG tablet Take 10 mg by mouth daily.  . Chromium-Cinnamon (CINNAMON PLUS CHROMIUM PO) Take by mouth.  . Cyanocobalamin (VITAMIN B-12 IJ) Inject as directed once. monthly  . desoximetasone (TOPICORT) 0.05 % cream   . dorzolamide-timolol (COSOPT) 22.3-6.8 MG/ML ophthalmic solution Place 1 drop into the left eye 2 (two) times daily.   Marland Kitchen gabapentin (NEURONTIN) 100 MG capsule TAKE 1 CAPSULE EVERY MORNING AND TAKE 2 CAPSULES AT BEDTIME  . glucose blood (ONETOUCH VERIO) test strip Check sugar daily Dx: E11.9  . Lancets 30G MISC One touch delica; to check blood sugars twice daily; E11.9  . latanoprost (XALATAN) 0.005 % ophthalmic solution Place 1 drop into the left eye at bedtime.  Marland Kitchen levothyroxine (SYNTHROID, LEVOTHROID) 50 MCG tablet TAKE 1 TABLET  EVERY DAY  . omeprazole (PRILOSEC) 20 MG capsule Take 1 capsule (20 mg total) by mouth 2 (two) times daily.  . simvastatin (ZOCOR) 10 MG tablet TAKE 1 TABLET EVERY DAY  . temazepam (RESTORIL) 15 MG capsule Take 1 capsule (15 mg total) by mouth at bedtime as needed for sleep.  . [DISCONTINUED] tizanidine (ZANAFLEX) 2 MG capsule Take 1 capsule (2 mg total) by mouth at bedtime as needed for muscle spasms.  . [DISCONTINUED] zoledronic acid (RECLAST) 5 MG/100ML SOLN Inject 5 mg into the vein once.   No facility-administered encounter medications on file as of 02/25/2017.     Review of Systems  Constitutional: Negative for appetite change and unexpected weight change.  HENT: Negative for congestion and sinus pressure.   Respiratory: Negative for cough, chest tightness and shortness of breath.   Cardiovascular: Negative for chest pain, palpitations and leg swelling.  Gastrointestinal: Negative for abdominal pain, diarrhea, nausea and vomiting.  Genitourinary: Negative for difficulty urinating and dysuria.  Musculoskeletal: Negative for joint swelling and myalgias.  Skin: Negative for rash.  Neurological: Negative for dizziness, light-headedness and headaches.  Psychiatric/Behavioral: Negative for agitation and dysphoric mood.       Objective:    Physical Exam  Constitutional: She appears well-developed and well-nourished. No distress.  HENT:  Nose: Nose normal.  Mouth/Throat: Oropharynx is clear and moist.  Neck: Neck supple. No thyromegaly present.  Cardiovascular: Normal rate and regular rhythm.  Pulmonary/Chest: Breath sounds normal. No respiratory distress. She has no wheezes.  Abdominal: Soft. Bowel sounds are normal. There is no tenderness.  Musculoskeletal: She exhibits no edema or tenderness.  Lymphadenopathy:    She has no cervical adenopathy.  Skin: No rash noted. No erythema.  Psychiatric: She has a normal mood and affect. Her behavior is normal.    BP 124/67 (BP  Location: Left Arm, Patient Position: Sitting, Cuff Size: Normal)   Pulse 62   Temp 98.7 F (37.1 C) (Oral)   Resp 14   Ht _0  (1.626 m)   Wt 159 lb (72.1 kg)   SpO2 95%   BMI 27.29 kg/m  Wt Readings from Last 3 Encounters:  02/25/17 159 lb (72.1 kg)  10/28/16 154 lb 12.8 oz (70.2 kg)  10/23/16 154 lb 6.4 oz (70 kg)     Lab Results  Component Value Date   WBC 7.5 05/29/2016   HGB 14.0 05/29/2016   HCT 42.3 05/29/2016   PLT 273.0 05/29/2016   GLUCOSE 100 (H) 02/23/2017   CHOL 173 02/23/2017  TRIG 56.0 02/23/2017   HDL 66.40 02/23/2017   LDLCALC 96 02/23/2017   ALT 12 02/23/2017   AST 15 02/23/2017   NA 141 02/23/2017   K 4.4 02/23/2017   CL 104 02/23/2017   CREATININE 0.94 02/23/2017   BUN 17 02/23/2017   CO2 29 02/23/2017   TSH 1.68 10/20/2016   HGBA1C 6.6 (H) 02/23/2017   MICROALBUR <0.7 05/29/2016    Mm Screening Breast Tomo Uni L  Result Date: 11/12/2016 CLINICAL DATA:  Screening. EXAM: 2D DIGITAL SCREENING UNILATERAL LEFT MAMMOGRAM WITH CAD AND ADJUNCT TOMO COMPARISON:  Previous exam(s). ACR Breast Density Category b: There are scattered areas of fibroglandular density. FINDINGS: The patient has had a right mastectomy. There are no findings suspicious for malignancy. Images were processed with CAD. IMPRESSION: No mammographic evidence of malignancy. A result letter of this screening mammogram will be mailed directly to the patient. RECOMMENDATION: Screening mammogram in one year.  (Code:SM-L-70M) BI-RADS CATEGORY  1: Negative. Electronically Signed   By: Lillia Mountain M.D.   On: 11/12/2016 10:53       Assessment & Plan:   Problem List Items Addressed This Visit    B12 deficiency - Primary    Will check B12 level today prior to injection.  Follow.  Continue B12 injections.        Relevant Orders   Vitamin B12 (Completed)   Diabetes ([place])    Low carb diet and exercise.  Follow met b and a1c.        Relevant Orders   Hemoglobin A1c   Microalbumin /  creatinine urine ratio   Hypercholesterolemia    On simvastatin.  Low cholesterol diet and exercise.  Follow lipid panel and liver function tests.        Relevant Orders   Hepatic function panel   Lipid panel   Hypertension    Blood pressure under good control.  Continue same medication regimen.  Follow pressures.  Follow metabolic panel.        Relevant Orders   CBC with Differential/Platelet   Basic metabolic panel   Hypothyroidism    On thyroid replacement.  Follow tsh.        Peripheral neuropathy    Stable.  On gabapentin.            Einar Pheasant, MD

## 2017-02-26 ENCOUNTER — Encounter: Payer: Self-pay | Admitting: Internal Medicine

## 2017-02-26 ENCOUNTER — Telehealth: Payer: Self-pay

## 2017-02-26 NOTE — Telephone Encounter (Signed)
Please schedule patient for B12 injection   Copied from [place] #[id number]. Topic: Appointment Scheduling - Scheduling Inquiry for Clinic >> Feb 26, 2017  2:41 PM Arletha Grippe wrote: Reason for CRM: pt needs to have b12 injection, since it was not in yestereday at her appt.  She can not wait until the next available (11/28). Can you call her and work her in please? Cb number is [phone]

## 2017-02-26 NOTE — Telephone Encounter (Signed)
Ok. Pt was already called.

## 2017-02-27 ENCOUNTER — Ambulatory Visit (INDEPENDENT_AMBULATORY_CARE_PROVIDER_SITE_OTHER): Payer: Medicare Other

## 2017-02-27 DIAGNOSIS — E538 Deficiency of other specified B group vitamins: Secondary | ICD-10-CM

## 2017-02-27 MED ORDER — CYANOCOBALAMIN 1000 MCG/ML IJ SOLN
1000.0000 ug | Freq: Once | INTRAMUSCULAR | Status: AC
  Administered 2017-02-27: 1000 ug via INTRAMUSCULAR

## 2017-02-27 NOTE — Progress Notes (Signed)
Patient came into clinic for B-12 injection. Patient tolerated very well. Patient had no questions, concerns, and or comments.

## 2017-02-28 ENCOUNTER — Encounter: Payer: Self-pay | Admitting: Internal Medicine

## 2017-02-28 DIAGNOSIS — E538 Deficiency of other specified B group vitamins: Secondary | ICD-10-CM | POA: Insufficient documentation

## 2017-02-28 NOTE — Assessment & Plan Note (Signed)
On thyroid replacement.  Follow tsh.  

## 2017-02-28 NOTE — Assessment & Plan Note (Signed)
On simvastatin.  Low cholesterol diet and exercise.  Follow lipid panel and liver function tests.   

## 2017-02-28 NOTE — Assessment & Plan Note (Signed)
Stable. On gabapentin.  

## 2017-02-28 NOTE — Assessment & Plan Note (Signed)
Will check B12 level today prior to injection.  Follow.  Continue B12 injections.

## 2017-02-28 NOTE — Assessment & Plan Note (Signed)
Blood pressure under good control.  Continue same medication regimen.  Follow pressures.  Follow metabolic panel.   

## 2017-02-28 NOTE — Assessment & Plan Note (Signed)
Low carb diet and exercise.  Follow met b and a1c.   

## 2017-03-03 ENCOUNTER — Other Ambulatory Visit: Payer: Self-pay | Admitting: Internal Medicine

## 2017-03-23 ENCOUNTER — Other Ambulatory Visit: Payer: Self-pay | Admitting: Internal Medicine

## 2017-04-01 ENCOUNTER — Ambulatory Visit: Payer: Self-pay

## 2017-04-03 ENCOUNTER — Ambulatory Visit (INDEPENDENT_AMBULATORY_CARE_PROVIDER_SITE_OTHER): Payer: Medicare Other

## 2017-04-03 DIAGNOSIS — E538 Deficiency of other specified B group vitamins: Secondary | ICD-10-CM | POA: Diagnosis not present

## 2017-04-03 MED ORDER — CYANOCOBALAMIN 1000 MCG/ML IJ SOLN
1000.0000 ug | Freq: Once | INTRAMUSCULAR | Status: AC
  Administered 2017-04-03: 1000 ug via INTRAMUSCULAR

## 2017-04-03 NOTE — Progress Notes (Signed)
B12 injection given in right deltoid. Patient tolerated well with no complaints or concerns.

## 2017-04-27 ENCOUNTER — Other Ambulatory Visit: Payer: Self-pay | Admitting: Internal Medicine

## 2017-05-04 ENCOUNTER — Other Ambulatory Visit: Payer: Self-pay | Admitting: Internal Medicine

## 2017-05-05 ENCOUNTER — Ambulatory Visit (INDEPENDENT_AMBULATORY_CARE_PROVIDER_SITE_OTHER): Payer: Medicare Other | Admitting: *Deleted

## 2017-05-05 DIAGNOSIS — E538 Deficiency of other specified B group vitamins: Secondary | ICD-10-CM

## 2017-05-05 MED ORDER — CYANOCOBALAMIN 1000 MCG/ML IJ SOLN
1000.0000 ug | Freq: Once | INTRAMUSCULAR | Status: AC
  Administered 2017-05-05: 1000 ug via INTRAMUSCULAR

## 2017-05-05 NOTE — Progress Notes (Addendum)
Patient presented for B 12 injection to right deltoid, patient voiced no concerns nor showed any signs of distress during injection.  Reviewed.  Dr Scott 

## 2017-05-11 ENCOUNTER — Encounter: Payer: Self-pay | Admitting: Internal Medicine

## 2017-05-12 NOTE — Telephone Encounter (Signed)
Called pt.  She wanted her son to establish care with me.  Information taken.  Give to Lindsay.

## 2017-05-28 ENCOUNTER — Other Ambulatory Visit: Payer: Self-pay | Admitting: Internal Medicine

## 2017-06-09 ENCOUNTER — Ambulatory Visit (INDEPENDENT_AMBULATORY_CARE_PROVIDER_SITE_OTHER): Payer: Medicare Other | Admitting: *Deleted

## 2017-06-09 DIAGNOSIS — E538 Deficiency of other specified B group vitamins: Secondary | ICD-10-CM

## 2017-06-09 MED ORDER — CYANOCOBALAMIN 1000 MCG/ML IJ SOLN
1000.0000 ug | Freq: Once | INTRAMUSCULAR | Status: AC
  Administered 2017-06-09: 1000 ug via INTRAMUSCULAR

## 2017-06-09 NOTE — Progress Notes (Addendum)
Patient presented for B 12 injection to right deltoid, patient voiced no concerns nor showed any signs of distress during injection.  Reviewed.  Dr Scott 

## 2017-06-22 ENCOUNTER — Other Ambulatory Visit (INDEPENDENT_AMBULATORY_CARE_PROVIDER_SITE_OTHER): Payer: Medicare Other

## 2017-06-22 ENCOUNTER — Encounter: Payer: Self-pay | Admitting: Internal Medicine

## 2017-06-22 DIAGNOSIS — I10 Essential (primary) hypertension: Secondary | ICD-10-CM

## 2017-06-22 DIAGNOSIS — E7800 Pure hypercholesterolemia, unspecified: Secondary | ICD-10-CM | POA: Diagnosis not present

## 2017-06-22 DIAGNOSIS — E119 Type 2 diabetes mellitus without complications: Secondary | ICD-10-CM

## 2017-06-22 LAB — CBC WITH DIFFERENTIAL/PLATELET
BASOS PCT: 0.6 % (ref 0.0–3.0)
Basophils Absolute: 0.1 10*3/uL (ref 0.0–0.1)
EOS ABS: 0.1 10*3/uL (ref 0.0–0.7)
EOS PCT: 0.8 % (ref 0.0–5.0)
HCT: 41.8 % (ref 36.0–46.0)
Hemoglobin: 13.8 g/dL (ref 12.0–15.0)
LYMPHS ABS: 1.6 10*3/uL (ref 0.7–4.0)
Lymphocytes Relative: 17.6 % (ref 12.0–46.0)
MCHC: 33.1 g/dL (ref 30.0–36.0)
MCV: 93 fl (ref 78.0–100.0)
MONO ABS: 0.7 10*3/uL (ref 0.1–1.0)
Monocytes Relative: 7.6 % (ref 3.0–12.0)
NEUTROS PCT: 73.4 % (ref 43.0–77.0)
Neutro Abs: 6.7 10*3/uL (ref 1.4–7.7)
Platelets: 224 10*3/uL (ref 150.0–400.0)
RBC: 4.49 Mil/uL (ref 3.87–5.11)
RDW: 14.4 % (ref 11.5–15.5)
WBC: 9.2 10*3/uL (ref 4.0–10.5)

## 2017-06-22 LAB — HEPATIC FUNCTION PANEL
ALT: 12 U/L (ref 0–35)
AST: 15 U/L (ref 0–37)
Albumin: 4.1 g/dL (ref 3.5–5.2)
Alkaline Phosphatase: 47 U/L (ref 39–117)
BILIRUBIN DIRECT: 0.1 mg/dL (ref 0.0–0.3)
BILIRUBIN TOTAL: 0.6 mg/dL (ref 0.2–1.2)
Total Protein: 7.6 g/dL (ref 6.0–8.3)

## 2017-06-22 LAB — MICROALBUMIN / CREATININE URINE RATIO
Creatinine,U: 100.3 mg/dL
Microalb Creat Ratio: 1 mg/g (ref 0.0–30.0)
Microalb, Ur: 1.1 mg/dL (ref 0.0–1.9)

## 2017-06-22 LAB — BASIC METABOLIC PANEL
BUN: 17 mg/dL (ref 6–23)
CHLORIDE: 105 meq/L (ref 96–112)
CO2: 29 meq/L (ref 19–32)
Calcium: 9.9 mg/dL (ref 8.4–10.5)
Creatinine, Ser: 0.93 mg/dL (ref 0.40–1.20)
GFR: 60.8 mL/min (ref >60.00)
GLUCOSE: 105 mg/dL — AB (ref 70–99)
POTASSIUM: 4.3 meq/L (ref 3.5–5.1)
Sodium: 142 mEq/L (ref 135–145)

## 2017-06-22 LAB — LIPID PANEL
CHOL/HDL RATIO: 2
Cholesterol: 163 mg/dL (ref 0–200)
HDL: 71.3 mg/dL (ref >39.00)
LDL Cholesterol: 82 mg/dL (ref 0–99)
NONHDL: 92.08
Triglycerides: 52 mg/dL (ref 0.0–149.0)
VLDL: 10.4 mg/dL (ref 0.0–40.0)

## 2017-06-22 LAB — HEMOGLOBIN A1C: HEMOGLOBIN A1C: 6.5 % (ref 4.6–6.5)

## 2017-06-24 ENCOUNTER — Ambulatory Visit (INDEPENDENT_AMBULATORY_CARE_PROVIDER_SITE_OTHER): Payer: Medicare Other

## 2017-06-24 ENCOUNTER — Encounter: Payer: Self-pay | Admitting: Internal Medicine

## 2017-06-24 ENCOUNTER — Ambulatory Visit (INDEPENDENT_AMBULATORY_CARE_PROVIDER_SITE_OTHER): Payer: Medicare Other | Admitting: Internal Medicine

## 2017-06-24 VITALS — BP 138/78 | HR 71 | Temp 97.9°F | Resp 18 | Wt 155.6 lb

## 2017-06-24 DIAGNOSIS — E7800 Pure hypercholesterolemia, unspecified: Secondary | ICD-10-CM | POA: Diagnosis not present

## 2017-06-24 DIAGNOSIS — G479 Sleep disorder, unspecified: Secondary | ICD-10-CM

## 2017-06-24 DIAGNOSIS — K2100 Gastro-esophageal reflux disease with esophagitis, without bleeding: Secondary | ICD-10-CM

## 2017-06-24 DIAGNOSIS — E039 Hypothyroidism, unspecified: Secondary | ICD-10-CM | POA: Diagnosis not present

## 2017-06-24 DIAGNOSIS — I10 Essential (primary) hypertension: Secondary | ICD-10-CM | POA: Diagnosis not present

## 2017-06-24 DIAGNOSIS — E119 Type 2 diabetes mellitus without complications: Secondary | ICD-10-CM

## 2017-06-24 DIAGNOSIS — E1142 Type 2 diabetes mellitus with diabetic polyneuropathy: Secondary | ICD-10-CM

## 2017-06-24 DIAGNOSIS — M25512 Pain in left shoulder: Secondary | ICD-10-CM | POA: Diagnosis not present

## 2017-06-24 DIAGNOSIS — F439 Reaction to severe stress, unspecified: Secondary | ICD-10-CM

## 2017-06-24 MED ORDER — GABAPENTIN 100 MG PO CAPS: ORAL_CAPSULE | ORAL | 1 refills | Status: DC

## 2017-06-24 NOTE — Progress Notes (Signed)
fo

## 2017-06-24 NOTE — Progress Notes (Signed)
Patient ID: Julie Hoover, female   DOB: August 19, 1929, 84 y.o.   MRN: [id number]   Subjective:    Patient ID: Julie Hoover, female    DOB: 12-24-1929, 84 y.o.   MRN: [id number]  HPI  Patient here for a scheduled follow up.  Increased stress recently.  Her son was just diagnosed with stomach cancer.  Her nephew just died unexpectedly.  She has good support.  Discussed with her.  She does not feel she needs anything more at this time.  Tries to stay active. No chest pain. No sob. No acid reflux.  No abdominal pain.  Bowels moving.  Discussed recent labs. a1c 6.5.     Past Medical History:  Diagnosis Date  . Arthritis   . Breast cancer ([place]) 1986   s/p right lumpectomy, XRT and Tamoxifen, mastectomy  . Cancer ([place])    skin ca  . Chicken pox   . Colon polyps   . Diabetes ([place])   . Diverticulosis   . Environmental allergies   . GERD (gastroesophageal reflux disease)   . Glaucoma   . Hypercholesterolemia   . Hyperglycemia   . Hypertension   . Hypothyroidism   . Osteopenia   . Peripheral neuropathy   . Personal history of radiation therapy 1986  . Urethral stricture    Past Surgical History:  Procedure Laterality Date  . BREAST BIOPSY Right 6/86   lumpectomy-positive/rad  . BREAST LUMPECTOMY  1986   right  . ESOPHAGOGASTRODUODENOSCOPY (EGD) WITH PROPOFOL N/A 12/17/2015   Procedure: ESOPHAGOGASTRODUODENOSCOPY (EGD) WITH PROPOFOL;  Surgeon: Lollie Sails, MD;  Location: [facility] ENDOSCOPY;  Service: Endoscopy;  Laterality: N/A;  . EYE SURGERY     Bilateral cataracts removed  . KNEE SURGERY  06/2008   torn meniscus, right  . KNEE SURGERY  2012   torn menisucs, right  . MASTECTOMY Right 1993   right, recurrence  . TUBAL LIGATION    . URETHRAL DILATION     Family History  Problem Relation Age of Onset  . Heart disease Father   . Hypertension Father   . COPD Mother   . Hypertension Brother        x2  . Hypercholesterolemia Brother        x2  . Hypertension  Sister   . Hypercholesterolemia Sister   . Diabetes Mellitus II Sister   . Cancer Unknown        mouth, aunt  . Colon cancer Neg Hx   . Breast cancer Neg Hx    Social History   Socioeconomic History  . Marital status: Widowed    Spouse name: None  . Number of children: 2  . Years of education: None  . Highest education level: None  Social Needs  . Financial resource strain: None  . Food insecurity - worry: None  . Food insecurity - inability: None  . Transportation needs - medical: None  . Transportation needs - non-medical: None  Occupational History  . None  Tobacco Use  . Smoking status: Never Smoker  . Smokeless tobacco: Never Used  Substance and Sexual Activity  . Alcohol use: No    Alcohol/week: 0.0 oz  . Drug use: No  . Sexual activity: No  Other Topics Concern  . None  Social History Narrative  . None    Outpatient Encounter Medications as of 06/24/2017  Medication Sig  . amLODipine (NORVASC) 5 MG tablet TAKE 1 TABLET EVERY DAY  . aspirin 81 MG tablet Take 81  mg by mouth daily.  . Blood Glucose Monitoring Suppl (ONETOUCH VERIO IQ SYSTEM) W/DEVICE KIT Use as directed Dx: E11.9 (Patient taking differently: Check sugars twice a day, twice a week Dx: E11.9)  . Calcium Carbonate-Vitamin D (CALCIUM 600+D) 600-400 MG-UNIT per tablet Take 1 tablet 3 (three) times daily by mouth.   . cetirizine (ZYRTEC) 10 MG tablet Take 10 mg by mouth daily.  . Chromium-Cinnamon (CINNAMON PLUS CHROMIUM PO) Take by mouth.  . Cyanocobalamin (VITAMIN B-12 IJ) Inject as directed once. monthly  . desoximetasone (TOPICORT) 0.05 % cream   . dorzolamide-timolol (COSOPT) 22.3-6.8 MG/ML ophthalmic solution Place 1 drop into the left eye 2 (two) times daily.   Marland Kitchen gabapentin (NEURONTIN) 100 MG capsule TAKE 1 CAPSULE EVERY MORNING AND TAKE 2 CAPSULES AT BEDTIME  . glucose blood (ONETOUCH VERIO) test strip Check sugar daily Dx: E11.9  . Lancets 30G MISC One touch delica; to check blood sugars twice  daily; E11.9  . latanoprost (XALATAN) 0.005 % ophthalmic solution Place 1 drop into the left eye at bedtime.  Marland Kitchen levothyroxine (SYNTHROID, LEVOTHROID) 50 MCG tablet TAKE 1 TABLET EVERY DAY  . omeprazole (PRILOSEC) 20 MG capsule TAKE 1 CAPSULE TWICE DAILY  . simvastatin (ZOCOR) 10 MG tablet TAKE 1 TABLET EVERY DAY  . temazepam (RESTORIL) 15 MG capsule Take 1 capsule (15 mg total) by mouth at bedtime as needed for sleep.  . [DISCONTINUED] gabapentin (NEURONTIN) 100 MG capsule TAKE 1 CAPSULE EVERY MORNING AND TAKE 2 CAPSULES AT BEDTIME   No facility-administered encounter medications on file as of 06/24/2017.     Review of Systems  Constitutional: Negative for appetite change and unexpected weight change.  HENT: Negative for congestion and sinus pressure.   Respiratory: Negative for cough, chest tightness and shortness of breath.   Cardiovascular: Negative for chest pain, palpitations and leg swelling.  Gastrointestinal: Negative for abdominal pain, diarrhea, nausea and vomiting.  Genitourinary: Negative for difficulty urinating and dysuria.  Musculoskeletal: Negative for joint swelling and myalgias.  Skin: Negative for color change and rash.  Neurological: Negative for dizziness, light-headedness and headaches.  Psychiatric/Behavioral: Negative for agitation.       Increased stress as outlined.         Objective:    Physical Exam  Constitutional: She appears well-developed and well-nourished. No distress.  HENT:  Nose: Nose normal.  Mouth/Throat: Oropharynx is clear and moist.  Neck: Neck supple. No thyromegaly present.  Cardiovascular: Normal rate and regular rhythm.  Pulmonary/Chest: Breath sounds normal. No respiratory distress. She has no wheezes.  Abdominal: Soft. Bowel sounds are normal. There is no tenderness.  Musculoskeletal: She exhibits no edema or tenderness.  Lymphadenopathy:    She has no cervical adenopathy.  Skin: No rash noted. No erythema.  Psychiatric: She has a  normal mood and affect. Her behavior is normal.    BP 138/78 (BP Location: Left Arm, Patient Position: Sitting, Cuff Size: Normal)   Pulse 71   Temp 97.9 F (36.6 C) (Oral)   Resp 18   Wt 155 lb 9.6 oz (70.6 kg)   SpO2 94%   BMI 26.71 kg/m  Wt Readings from Last 3 Encounters:  06/24/17 155 lb 9.6 oz (70.6 kg)  02/25/17 159 lb (72.1 kg)  10/28/16 154 lb 12.8 oz (70.2 kg)     Lab Results  Component Value Date   WBC 9.2 06/22/2017   HGB 13.8 06/22/2017   HCT 41.8 06/22/2017   PLT 224.0 06/22/2017   GLUCOSE 105 (H) 06/22/2017  CHOL 163 06/22/2017   TRIG 52.0 06/22/2017   HDL 71.30 06/22/2017   LDLCALC 82 06/22/2017   ALT 12 06/22/2017   AST 15 06/22/2017   NA 142 06/22/2017   K 4.3 06/22/2017   CL 105 06/22/2017   CREATININE 0.93 06/22/2017   BUN 17 06/22/2017   CO2 29 06/22/2017   TSH 1.68 10/20/2016   HGBA1C 6.5 06/22/2017   MICROALBUR 1.1 06/22/2017    Mm Screening Breast Tomo Uni L  Result Date: 11/12/2016 CLINICAL DATA:  Screening. EXAM: 2D DIGITAL SCREENING UNILATERAL LEFT MAMMOGRAM WITH CAD AND ADJUNCT TOMO COMPARISON:  Previous exam(s). ACR Breast Density Category b: There are scattered areas of fibroglandular density. FINDINGS: The patient has had a right mastectomy. There are no findings suspicious for malignancy. Images were processed with CAD. IMPRESSION: No mammographic evidence of malignancy. A result letter of this screening mammogram will be mailed directly to the patient. RECOMMENDATION: Screening mammogram in one year.  (Code:SM-L-31M) BI-RADS CATEGORY  1: Negative. Electronically Signed   By: Lillia Mountain M.D.   On: 11/12/2016 10:53       Assessment & Plan:   Problem List Items Addressed This Visit    GERD (gastroesophageal reflux disease)    Controlled on prilosec.        Hypercholesterolemia    On simvastatin.  Low cholesterol diet and exercise.  Follow lipid panel and liver function tests.        Relevant Orders   Lipid panel   Hepatic  function panel   Hypertension    Blood pressure under good control.  Continue same medication regimen.  Follow pressures.  Follow metabolic panel.        Relevant Orders   TSH   Hypothyroidism    On thyroid replacement.  Follow tsh.        Left shoulder pain - Primary    Persistent pain.  Check xray.  May need therapy.        Relevant Orders   DG Shoulder Left (Completed)   Sleep disturbance    Uses temazepam prn.  Follow.       Stress    Increased stress as outlined.  Discussed with her today. She has good support.  Does not feel needs any further intervention at this time.  Follow.        Type 2 diabetes mellitus with diabetic polyneuropathy, without long-term current use of insulin (HCC)    Low carb diet and exercise.  Follow met b and a1c.        Relevant Medications   gabapentin (NEURONTIN) 100 MG capsule       Einar Pheasant, MD

## 2017-06-26 ENCOUNTER — Ambulatory Visit: Payer: Self-pay | Admitting: Internal Medicine

## 2017-06-27 ENCOUNTER — Encounter: Payer: Self-pay | Admitting: Internal Medicine

## 2017-06-27 DIAGNOSIS — F439 Reaction to severe stress, unspecified: Secondary | ICD-10-CM | POA: Insufficient documentation

## 2017-06-27 NOTE — Assessment & Plan Note (Signed)
Increased stress as outlined.  Discussed with her today.  She has good support. Does not feel needs any further intervention at this time.  Follow.  

## 2017-06-27 NOTE — Assessment & Plan Note (Signed)
On thyroid replacement.  Follow tsh.  

## 2017-06-27 NOTE — Assessment & Plan Note (Signed)
Blood pressure under good control.  Continue same medication regimen.  Follow pressures.  Follow metabolic panel.   

## 2017-06-27 NOTE — Assessment & Plan Note (Signed)
Uses temazepam prn.  Follow.

## 2017-06-27 NOTE — Assessment & Plan Note (Signed)
Controlled on prilosec.   

## 2017-06-27 NOTE — Assessment & Plan Note (Signed)
Persistent pain.  Check xray.  May need therapy.

## 2017-06-27 NOTE — Assessment & Plan Note (Signed)
Low carb diet and exercise.  Follow met b and a1c.   

## 2017-06-27 NOTE — Assessment & Plan Note (Signed)
On simvastatin.  Low cholesterol diet and exercise.  Follow lipid panel and liver function tests.   

## 2017-07-14 ENCOUNTER — Ambulatory Visit (INDEPENDENT_AMBULATORY_CARE_PROVIDER_SITE_OTHER): Payer: Medicare Other | Admitting: *Deleted

## 2017-07-14 DIAGNOSIS — E538 Deficiency of other specified B group vitamins: Secondary | ICD-10-CM

## 2017-07-14 MED ORDER — CYANOCOBALAMIN 1000 MCG/ML IJ SOLN
1000.0000 ug | Freq: Once | INTRAMUSCULAR | Status: AC
  Administered 2017-07-14: 1000 ug via INTRAMUSCULAR

## 2017-07-14 NOTE — Progress Notes (Signed)
Patient presented for B 12 injection to left deltoid, patient voiced no concerns nor showed any signs of distress during injection. 

## 2017-07-24 ENCOUNTER — Other Ambulatory Visit: Payer: Self-pay | Admitting: Internal Medicine

## 2017-07-24 DIAGNOSIS — H26493 Other secondary cataract, bilateral: Secondary | ICD-10-CM | POA: Diagnosis not present

## 2017-07-24 MED ORDER — TEMAZEPAM 15 MG PO CAPS: 15.0000 mg | ORAL_CAPSULE | Freq: Every evening | ORAL | 0 refills | Status: DC | PRN

## 2017-07-24 NOTE — Telephone Encounter (Signed)
LOV  06/24/17 Dr. Gareth Eagle  [place]

## 2017-07-24 NOTE — Telephone Encounter (Signed)
Refilled: 10/23/2016 Last OV: 06/24/2017 Next OV: 10/28/2017  Spoke with pt and she stated that she only has one pill left.

## 2017-07-24 NOTE — Telephone Encounter (Signed)
Printed, signed and faxed.  

## 2017-07-24 NOTE — Telephone Encounter (Signed)
Copied from [place]. Topic: Quick Communication - Rx Refill/Question >> Jul 24, 2017  9:58 AM Antonieta Iba C wrote: Medication: temazepam (RESTORIL) 15 MG capsule  Has the patient contacted their pharmacy? Yes   (Agent: If no, request that the patient contact the pharmacy for the refill.)  Preferred Pharmacy (with phone number or street name): [place], [place] - [place] [phone] (Phone) [phone] (Fax)    b Agent: Please be advised that RX refills may take up to 3 business days. We ask that you follow-up with your pharmacy.

## 2017-07-29 ENCOUNTER — Other Ambulatory Visit: Payer: Self-pay | Admitting: Internal Medicine

## 2017-08-06 DIAGNOSIS — H26491 Other secondary cataract, right eye: Secondary | ICD-10-CM | POA: Diagnosis not present

## 2017-08-18 ENCOUNTER — Ambulatory Visit (INDEPENDENT_AMBULATORY_CARE_PROVIDER_SITE_OTHER): Payer: Medicare Other

## 2017-08-18 DIAGNOSIS — E538 Deficiency of other specified B group vitamins: Secondary | ICD-10-CM

## 2017-08-18 MED ORDER — CYANOCOBALAMIN 1000 MCG/ML IJ SOLN
1000.0000 ug | Freq: Once | INTRAMUSCULAR | Status: AC
  Administered 2017-08-18: 1000 ug via INTRAMUSCULAR

## 2017-08-18 NOTE — Progress Notes (Addendum)
Patient given B12 injection in right deltoid. Patient tolerated well.  Reviewed.  Dr Nicki Reaper

## 2017-08-28 DIAGNOSIS — H401133 Primary open-angle glaucoma, bilateral, severe stage: Secondary | ICD-10-CM | POA: Diagnosis not present

## 2017-09-14 ENCOUNTER — Other Ambulatory Visit: Payer: Self-pay | Admitting: Internal Medicine

## 2017-09-15 ENCOUNTER — Telehealth: Payer: Self-pay | Admitting: Internal Medicine

## 2017-09-15 NOTE — Telephone Encounter (Signed)
Please advise 

## 2017-09-15 NOTE — Telephone Encounter (Signed)
Copied from [place] [id number]. Topic: Quick Communication - Rx Refill/Question >> Sep 15, 2017  3:34 PM Margot Ables wrote: Medication: temazepam  - pt is wanting to change back to 30mg  - she has been using the 15mg  and gets to sleep but only sleeps a few hours.  Pt requesting call back to let her know if ok.  Has the patient contacted their pharmacy? No - change in dose Preferred Pharmacy (with phone number or street name): [facility] [address] [place] - [place] [phone] (Phone) [phone] (Fax)

## 2017-09-16 ENCOUNTER — Encounter: Payer: Self-pay | Admitting: Internal Medicine

## 2017-09-16 ENCOUNTER — Other Ambulatory Visit: Payer: Self-pay | Admitting: Internal Medicine

## 2017-09-17 NOTE — Telephone Encounter (Signed)
Left message to call back  

## 2017-09-17 NOTE — Telephone Encounter (Signed)
Ok to change back to 30 mg?

## 2017-09-17 NOTE — Telephone Encounter (Signed)
Please confirm she did ok with temazepam 30mg .  If so, ok to refill 30mg  .

## 2017-09-18 ENCOUNTER — Other Ambulatory Visit: Payer: Self-pay

## 2017-09-18 MED ORDER — TEMAZEPAM 30 MG PO CAPS: 30.0000 mg | ORAL_CAPSULE | Freq: Every evening | ORAL | 0 refills | Status: DC | PRN

## 2017-09-18 NOTE — Telephone Encounter (Signed)
Temazepam faxed. Pt aware

## 2017-09-18 NOTE — Telephone Encounter (Signed)
Patient stated that she did ok on the 30 mg of temazepam. She said she was on that dose for years.

## 2017-09-18 NOTE — Telephone Encounter (Signed)
Patient returning call.

## 2017-09-18 NOTE — Telephone Encounter (Signed)
Patient returned call

## 2017-09-22 ENCOUNTER — Other Ambulatory Visit: Payer: Self-pay | Admitting: Internal Medicine

## 2017-09-22 ENCOUNTER — Ambulatory Visit (INDEPENDENT_AMBULATORY_CARE_PROVIDER_SITE_OTHER): Payer: Medicare Other

## 2017-09-22 ENCOUNTER — Encounter: Payer: Self-pay | Admitting: *Deleted

## 2017-09-22 DIAGNOSIS — E538 Deficiency of other specified B group vitamins: Secondary | ICD-10-CM | POA: Diagnosis not present

## 2017-09-22 MED ORDER — CYANOCOBALAMIN 1000 MCG/ML IJ SOLN
1000.0000 ug | Freq: Once | INTRAMUSCULAR | Status: AC
  Administered 2017-09-22: 1000 ug via INTRAMUSCULAR

## 2017-09-22 NOTE — Telephone Encounter (Signed)
Pt states the Rx for [facility] VERIO test strip needs a diagnosis code in order for Medicare to pay. Pt requests diagnosis code be sent to [place], [place] - [place] [phone] (Phone) [phone] (Fax)

## 2017-09-22 NOTE — Progress Notes (Addendum)
Patient comes in today for a vitamin B 12 injection. Administered in left deltoid IM. Patient tolerated well.  Reviewed.  Dr Scott 

## 2017-09-22 NOTE — Telephone Encounter (Signed)
This encounter was created in error - please disregard.

## 2017-09-23 ENCOUNTER — Other Ambulatory Visit: Payer: Self-pay

## 2017-09-23 MED ORDER — GLUCOSE BLOOD VI STRP: ORAL_STRIP | 3 refills | Status: DC

## 2017-09-23 NOTE — Telephone Encounter (Signed)
RX was resent by Dr. Nicki Reaper

## 2017-09-24 ENCOUNTER — Encounter: Payer: Self-pay | Admitting: Internal Medicine

## 2017-09-24 ENCOUNTER — Other Ambulatory Visit: Payer: Self-pay

## 2017-09-24 MED ORDER — GLUCOSE BLOOD VI STRP: ORAL_STRIP | 3 refills | Status: DC

## 2017-09-28 ENCOUNTER — Encounter: Payer: Self-pay | Admitting: Internal Medicine

## 2017-09-28 NOTE — Telephone Encounter (Signed)
Called patient to let her know that we could fax written order once signed, placed in your folder for signature

## 2017-09-29 NOTE — Telephone Encounter (Signed)
Signed and placed in box.   

## 2017-09-29 NOTE — Telephone Encounter (Signed)
Faxed rx. Patient aware

## 2017-10-13 ENCOUNTER — Other Ambulatory Visit: Payer: Self-pay | Admitting: Internal Medicine

## 2017-10-13 DIAGNOSIS — Z1231 Encounter for screening mammogram for malignant neoplasm of breast: Secondary | ICD-10-CM

## 2017-10-26 ENCOUNTER — Other Ambulatory Visit: Payer: Self-pay

## 2017-10-26 ENCOUNTER — Other Ambulatory Visit (INDEPENDENT_AMBULATORY_CARE_PROVIDER_SITE_OTHER): Payer: Medicare Other

## 2017-10-26 DIAGNOSIS — I10 Essential (primary) hypertension: Secondary | ICD-10-CM

## 2017-10-26 DIAGNOSIS — E119 Type 2 diabetes mellitus without complications: Secondary | ICD-10-CM | POA: Diagnosis not present

## 2017-10-26 DIAGNOSIS — E7800 Pure hypercholesterolemia, unspecified: Secondary | ICD-10-CM

## 2017-10-26 LAB — LIPID PANEL
CHOL/HDL RATIO: 2
Cholesterol: 174 mg/dL (ref 0–200)
HDL: 75.9 mg/dL (ref >39.00)
LDL CALC: 86 mg/dL (ref 0–99)
NONHDL: 98.47
Triglycerides: 60 mg/dL (ref 0.0–149.0)
VLDL: 12 mg/dL (ref 0.0–40.0)

## 2017-10-26 LAB — BASIC METABOLIC PANEL
BUN: 14 mg/dL (ref 6–23)
CHLORIDE: 104 meq/L (ref 96–112)
CO2: 29 meq/L (ref 19–32)
CREATININE: 0.9 mg/dL (ref 0.40–1.20)
Calcium: 9.9 mg/dL (ref 8.4–10.5)
GFR: 63.09 mL/min (ref >60.00)
Glucose, Bld: 107 mg/dL — ABNORMAL HIGH (ref 70–99)
POTASSIUM: 4.5 meq/L (ref 3.5–5.1)
Sodium: 141 mEq/L (ref 135–145)

## 2017-10-26 LAB — HEPATIC FUNCTION PANEL
ALT: 9 U/L (ref 0–35)
AST: 12 U/L (ref 0–37)
Albumin: 4.2 g/dL (ref 3.5–5.2)
Alkaline Phosphatase: 49 U/L (ref 39–117)
BILIRUBIN DIRECT: 0.1 mg/dL (ref 0.0–0.3)
TOTAL PROTEIN: 7.2 g/dL (ref 6.0–8.3)
Total Bilirubin: 0.6 mg/dL (ref 0.2–1.2)

## 2017-10-26 LAB — TSH: TSH: 3.52 u[IU]/mL (ref 0.35–4.50)

## 2017-10-26 LAB — HEMOGLOBIN A1C: Hgb A1c MFr Bld: 6.6 % — ABNORMAL HIGH (ref 4.6–6.5)

## 2017-10-27 ENCOUNTER — Encounter: Payer: Self-pay | Admitting: Internal Medicine

## 2017-10-28 ENCOUNTER — Encounter: Payer: Self-pay | Admitting: Internal Medicine

## 2017-10-28 ENCOUNTER — Ambulatory Visit (INDEPENDENT_AMBULATORY_CARE_PROVIDER_SITE_OTHER): Payer: Medicare Other | Admitting: Internal Medicine

## 2017-10-28 VITALS — BP 124/64 | HR 69 | Ht 65.0 in | Wt 151.2 lb

## 2017-10-28 DIAGNOSIS — Z87448 Personal history of other diseases of urinary system: Secondary | ICD-10-CM

## 2017-10-28 DIAGNOSIS — G479 Sleep disorder, unspecified: Secondary | ICD-10-CM

## 2017-10-28 DIAGNOSIS — Z853 Personal history of malignant neoplasm of breast: Secondary | ICD-10-CM

## 2017-10-28 DIAGNOSIS — E1142 Type 2 diabetes mellitus with diabetic polyneuropathy: Secondary | ICD-10-CM | POA: Diagnosis not present

## 2017-10-28 DIAGNOSIS — K2100 Gastro-esophageal reflux disease with esophagitis, without bleeding: Secondary | ICD-10-CM

## 2017-10-28 DIAGNOSIS — Z0000 Encounter for general adult medical examination without abnormal findings: Secondary | ICD-10-CM

## 2017-10-28 DIAGNOSIS — E538 Deficiency of other specified B group vitamins: Secondary | ICD-10-CM

## 2017-10-28 DIAGNOSIS — Z9109 Other allergy status, other than to drugs and biological substances: Secondary | ICD-10-CM

## 2017-10-28 DIAGNOSIS — E039 Hypothyroidism, unspecified: Secondary | ICD-10-CM

## 2017-10-28 DIAGNOSIS — E7800 Pure hypercholesterolemia, unspecified: Secondary | ICD-10-CM | POA: Diagnosis not present

## 2017-10-28 DIAGNOSIS — F439 Reaction to severe stress, unspecified: Secondary | ICD-10-CM

## 2017-10-28 DIAGNOSIS — I10 Essential (primary) hypertension: Secondary | ICD-10-CM

## 2017-10-28 LAB — HM DIABETES FOOT EXAM

## 2017-10-28 MED ORDER — CYANOCOBALAMIN 1000 MCG/ML IJ SOLN
1000.0000 ug | Freq: Once | INTRAMUSCULAR | Status: AC
  Administered 2017-10-28: 1000 ug via INTRAMUSCULAR

## 2017-10-28 NOTE — Progress Notes (Signed)
Patient ID: Julie Hoover, female   DOB: [birth year], 84 y.o.   MRN: [id number]   Subjective:    Patient ID: Julie Hoover, female    DOB: [birth year], 84 y.o.   MRN: [id number]  HPI  Patient with past history of breast cancer, diabetes, hypertension and hypercholesterolemia.  She comes in today to follow up on these issues as well as for a complete physical exam.  Increased stress recently with the loss of her son.  She is handling things relatively well and does not feel she needs anything more.  Tries to stay active.  No chest pain.  No sob.  No acid reflux.  No abdominal pain.  Bowels moving.  Discussed labs.  a1c 6.6 - stable.  Discussed shingrix.  Blood sugars averaging 90-110 in the am and 120-140s in the pm.     Past Medical History:  Diagnosis Date  . Arthritis   . Breast cancer ([place]) 1986   s/p right lumpectomy, XRT and Tamoxifen, mastectomy  . Cancer ([place])    skin ca  . Chicken pox   . Colon polyps   . Diabetes ([place])   . Diverticulosis   . Environmental allergies   . GERD (gastroesophageal reflux disease)   . Glaucoma   . Hypercholesterolemia   . Hyperglycemia   . Hypertension   . Hypothyroidism   . Osteopenia   . Peripheral neuropathy   . Personal history of radiation therapy 1986  . Urethral stricture    Past Surgical History:  Procedure Laterality Date  . BREAST BIOPSY Right 6/86   lumpectomy-positive/rad  . BREAST LUMPECTOMY  1986   right  . ESOPHAGOGASTRODUODENOSCOPY (EGD) WITH PROPOFOL N/A 12/17/2015   Procedure: ESOPHAGOGASTRODUODENOSCOPY (EGD) WITH PROPOFOL;  Surgeon: Lollie Sails, MD;  Location: [facility] ENDOSCOPY;  Service: Endoscopy;  Laterality: N/A;  . EYE SURGERY     Bilateral cataracts removed  . KNEE SURGERY  06/2008   torn meniscus, right  . KNEE SURGERY  2012   torn menisucs, right  . MASTECTOMY Right 1993   right, recurrence  . TUBAL LIGATION    . URETHRAL DILATION     Family History  Problem Relation Age of Onset  .  Heart disease Father   . Hypertension Father   . COPD Mother   . Hypertension Brother        x2  . Hypercholesterolemia Brother        x2  . Hypertension Sister   . Hypercholesterolemia Sister   . Diabetes Mellitus II Sister   . Cancer Unknown        mouth, aunt  . Colon cancer Neg Hx   . Breast cancer Neg Hx    Social History   Socioeconomic History  . Marital status: Widowed    Spouse name: Not on file  . Number of children: 2  . Years of education: Not on file  . Highest education level: Not on file  Occupational History  . Not on file  Social Needs  . Financial resource strain: Not on file  . Food insecurity:    Worry: Not on file    Inability: Not on file  . Transportation needs:    Medical: Not on file    Non-medical: Not on file  Tobacco Use  . Smoking status: Never Smoker  . Smokeless tobacco: Never Used  Substance and Sexual Activity  . Alcohol use: No    Alcohol/week: 0.0 oz  . Drug use: No  . Sexual activity:  Never  Lifestyle  . Physical activity:    Days per week: Not on file    Minutes per session: Not on file  . Stress: Not on file  Relationships  . Social connections:    Talks on phone: Not on file    Gets together: Not on file    Attends religious service: Not on file    Active member of club or organization: Not on file    Attends meetings of clubs or organizations: Not on file    Relationship status: Not on file  Other Topics Concern  . Not on file  Social History Narrative  . Not on file    Outpatient Encounter Medications as of 10/28/2017  Medication Sig  . amLODipine (NORVASC) 5 MG tablet TAKE 1 TABLET EVERY DAY  . aspirin 81 MG tablet Take 81 mg by mouth daily.  . Blood Glucose Monitoring Suppl (ONETOUCH VERIO IQ SYSTEM) W/DEVICE KIT Use as directed Dx: E11.9 (Patient taking differently: Check sugars twice a day, twice a week Dx: E11.9)  . Calcium Carbonate-Vitamin D 600-400 MG-UNIT chew tablet Chew 1 tablet by mouth 2 (two) times  daily.  . cetirizine (ZYRTEC) 10 MG tablet Take 10 mg by mouth daily.  . Chromium-Cinnamon (CINNAMON PLUS CHROMIUM PO) Take by mouth.  . Cyanocobalamin (VITAMIN B-12 IJ) Inject as directed once. monthly  . desoximetasone (TOPICORT) 0.05 % cream   . dorzolamide-timolol (COSOPT) 22.3-6.8 MG/ML ophthalmic solution Place 1 drop into the left eye 2 (two) times daily.   Marland Kitchen gabapentin (NEURONTIN) 100 MG capsule TAKE 1 CAPSULE EVERY MORNING AND TAKE 2 CAPSULES AT BEDTIME  . glucose blood (ONETOUCH VERIO) test strip CHECK SUGAR ONCE DAILY. Dx E 11.9  . Lancets 30G MISC One touch delica; to check blood sugars twice daily; E11.9  . latanoprost (XALATAN) 0.005 % ophthalmic solution Place 1 drop into the left eye at bedtime.  Marland Kitchen levothyroxine (SYNTHROID, LEVOTHROID) 50 MCG tablet TAKE 1 TABLET EVERY DAY  . omeprazole (PRILOSEC) 20 MG capsule TAKE 1 CAPSULE TWICE DAILY  . simvastatin (ZOCOR) 10 MG tablet TAKE 1 TABLET EVERY DAY  . temazepam (RESTORIL) 30 MG capsule Take 1 capsule (30 mg total) by mouth at bedtime as needed.  . Calcium Carbonate-Vitamin D (CALCIUM 600+D) 600-400 MG-UNIT per tablet Take 1 tablet 3 (three) times daily by mouth.   . [EXPIRED] cyanocobalamin ((VITAMIN B-12)) injection 1,000 mcg    No facility-administered encounter medications on file as of 10/28/2017.     Review of Systems  Constitutional: Negative for appetite change and unexpected weight change.  HENT: Negative for congestion and sinus pressure.   Eyes: Negative for pain and visual disturbance.  Respiratory: Negative for cough, chest tightness and shortness of breath.   Cardiovascular: Negative for chest pain, palpitations and leg swelling.  Gastrointestinal: Negative for abdominal pain, diarrhea, nausea and vomiting.  Genitourinary: Negative for difficulty urinating and dysuria.  Musculoskeletal: Negative for joint swelling and myalgias.  Skin: Negative for color change and rash.  Neurological: Negative for dizziness,  light-headedness and headaches.  Hematological: Negative for adenopathy. Does not bruise/bleed easily.  Psychiatric/Behavioral: Negative for agitation and dysphoric mood.       Increased stress as outlined.         Objective:    Physical Exam  Constitutional: She appears well-developed and well-nourished. No distress.  HENT:  Nose: Nose normal.  Mouth/Throat: Oropharynx is clear and moist.  Eyes: Conjunctivae are normal. Right eye exhibits no discharge. Left eye exhibits no discharge.  Neck: Neck supple. No thyromegaly present.  Cardiovascular: Normal rate and regular rhythm.  Pulmonary/Chest: Breath sounds normal. No respiratory distress. She has no wheezes.  Breasts:  S/p right mastectomy.  Left breast - no nipple discharge or nipple retraction present.  Could not appreciate any distinct nodules or axillary adenopathy.    Abdominal: Soft. Bowel sounds are normal. There is no tenderness.  Musculoskeletal: She exhibits no edema or tenderness.  Feet:  No lesions.  DP pulses palpable and equal bilaterally.  Intact to light touch and pin prick.    Lymphadenopathy:    She has no cervical adenopathy.  Skin: No rash noted. No erythema.  Psychiatric: She has a normal mood and affect. Her behavior is normal.    BP 124/64 (BP Location: Right Arm, Patient Position: Sitting, Cuff Size: Normal)   Pulse 69   Ht 5' 5"  (1.651 m)   Wt 151 lb 3.2 oz (68.6 kg)   SpO2 94%   BMI 25.16 kg/m  Wt Readings from Last 3 Encounters:  10/28/17 151 lb 3.2 oz (68.6 kg)  06/24/17 155 lb 9.6 oz (70.6 kg)  02/25/17 159 lb (72.1 kg)     Lab Results  Component Value Date   WBC 9.2 06/22/2017   HGB 13.8 06/22/2017   HCT 41.8 06/22/2017   PLT 224.0 06/22/2017   GLUCOSE 107 (H) 10/26/2017   CHOL 174 10/26/2017   TRIG 60.0 10/26/2017   HDL 75.90 10/26/2017   LDLCALC 86 10/26/2017   ALT 9 10/26/2017   AST 12 10/26/2017   NA 141 10/26/2017   K 4.5 10/26/2017   CL 104 10/26/2017   CREATININE 0.90  10/26/2017   BUN 14 10/26/2017   CO2 29 10/26/2017   TSH 3.52 10/26/2017   HGBA1C 6.6 (H) 10/26/2017   MICROALBUR 1.1 06/22/2017    Mm Screening Breast Tomo Uni L  Result Date: 11/12/2016 CLINICAL DATA:  Screening. EXAM: 2D DIGITAL SCREENING UNILATERAL LEFT MAMMOGRAM WITH CAD AND ADJUNCT TOMO COMPARISON:  Previous exam(s). ACR Breast Density Category b: There are scattered areas of fibroglandular density. FINDINGS: The patient has had a right mastectomy. There are no findings suspicious for malignancy. Images were processed with CAD. IMPRESSION: No mammographic evidence of malignancy. A result letter of this screening mammogram will be mailed directly to the patient. RECOMMENDATION: Screening mammogram in one year.  (Code:SM-L-5M) BI-RADS CATEGORY  1: Negative. Electronically Signed   By: Lillia Mountain M.D.   On: 11/12/2016 10:53       Assessment & Plan:   Problem List Items Addressed This Visit    B12 deficiency    Continue B12 injections.        Relevant Medications   cyanocobalamin ((VITAMIN B-12)) injection 1,000 mcg (Completed)   Environmental allergies    Controlled on current regimen.        GERD (gastroesophageal reflux disease)    Controlled on prilosec.  Follow.       H/O urethral stricture    Has required dilatation.  Does not feel needs any further intervention at this time.  Follow.        Health care maintenance    Physical today 10/28/17.  Ordered mammogram.  She will schedule.  Colonoscopy 08/20/09  (age 3).        History of breast cancer    Mammogram 11/12/16.  F/u mammogram ordered.  She will schedule.        Hypercholesterolemia    On simvastatin.  Low cholesterol diet and exercise.  Follow lipid panel and liver function tests.   Lab Results  Component Value Date   CHOL 174 10/26/2017   HDL 75.90 10/26/2017   LDLCALC 86 10/26/2017   TRIG 60.0 10/26/2017   CHOLHDL 2 10/26/2017        Hypertension    Blood pressure under good control.  Continue  same medication regimen.  Follow pressures.  Follow metabolic panel.        Hypothyroidism    On thyroid replacement.  Follow tsh.        Sleep disturbance    Uses tamazepam.  Recently increased dose.  Discussed with her today.  She does not feel needs anything more at this time.  Follow.       Stress    Increased stress as outlined.  Discussed with her today.  She does not feel needs anything more at this time.  Follow.       Type 2 diabetes mellitus with diabetic polyneuropathy, without long-term current use of insulin (HCC)    Low carb diet and exercise.  Follow met b and a1c.  Sugars as outlined.           Einar Pheasant, MD

## 2017-10-28 NOTE — Assessment & Plan Note (Signed)
Continue B12 injections.   

## 2017-10-28 NOTE — Assessment & Plan Note (Signed)
Physical today 10/28/17.  Ordered mammogram.  She will schedule.  Colonoscopy 08/20/09  (age 84).

## 2017-10-29 ENCOUNTER — Ambulatory Visit: Payer: Self-pay

## 2017-11-06 ENCOUNTER — Encounter: Payer: Self-pay | Admitting: Internal Medicine

## 2017-11-06 NOTE — Assessment & Plan Note (Signed)
Mammogram 11/12/16.  F/u mammogram ordered.  She will schedule.

## 2017-11-06 NOTE — Assessment & Plan Note (Signed)
Controlled on current regimen.   

## 2017-11-06 NOTE — Assessment & Plan Note (Signed)
Increased stress as outlined.  Discussed with her today.  She does not feel needs anything more at this time.  Follow.   

## 2017-11-06 NOTE — Assessment & Plan Note (Signed)
Controlled on prilosec.  Follow.  

## 2017-11-06 NOTE — Assessment & Plan Note (Signed)
Has required dilatation.  Does not feel needs any further intervention at this time.  Follow.

## 2017-11-06 NOTE — Assessment & Plan Note (Signed)
On simvastatin.  Low cholesterol diet and exercise.  Follow lipid panel and liver function tests.   Lab Results  Component Value Date   CHOL 174 10/26/2017   HDL 75.90 10/26/2017   LDLCALC 86 10/26/2017   TRIG 60.0 10/26/2017   CHOLHDL 2 10/26/2017

## 2017-11-06 NOTE — Assessment & Plan Note (Signed)
Low carb diet and exercise.  Follow met b and a1c.  Sugars as outlined.

## 2017-11-06 NOTE — Assessment & Plan Note (Signed)
Uses tamazepam.  Recently increased dose.  Discussed with her today.  She does not feel needs anything more at this time.  Follow.

## 2017-11-06 NOTE — Assessment & Plan Note (Signed)
Blood pressure under good control.  Continue same medication regimen.  Follow pressures.  Follow metabolic panel.   

## 2017-11-06 NOTE — Assessment & Plan Note (Signed)
On thyroid replacement.  Follow tsh.  

## 2017-11-16 ENCOUNTER — Ambulatory Visit
Admission: RE | Admit: 2017-11-16 | Discharge: 2017-11-16 | Disposition: A | Discharge status: Home / Self Care | Payer: Medicare Other | Source: Ambulatory Visit | Attending: Internal Medicine | Admitting: Internal Medicine

## 2017-11-16 DIAGNOSIS — Z1231 Encounter for screening mammogram for malignant neoplasm of breast: Secondary | ICD-10-CM | POA: Insufficient documentation

## 2017-11-20 ENCOUNTER — Other Ambulatory Visit: Payer: Self-pay

## 2017-11-25 ENCOUNTER — Other Ambulatory Visit: Payer: Self-pay | Admitting: Internal Medicine

## 2017-12-01 ENCOUNTER — Ambulatory Visit (INDEPENDENT_AMBULATORY_CARE_PROVIDER_SITE_OTHER): Payer: Medicare Other

## 2017-12-01 DIAGNOSIS — E538 Deficiency of other specified B group vitamins: Secondary | ICD-10-CM | POA: Diagnosis not present

## 2017-12-01 MED ORDER — CYANOCOBALAMIN 1000 MCG/ML IJ SOLN
1000.0000 ug | Freq: Once | INTRAMUSCULAR | Status: AC
  Administered 2017-12-01: 1000 ug via INTRAMUSCULAR

## 2017-12-01 NOTE — Progress Notes (Addendum)
b12 injection given in right deltoid. Patient tolerated injection well.  Reviewed.  Dr Nicki Reaper

## 2017-12-14 ENCOUNTER — Other Ambulatory Visit: Payer: Self-pay | Admitting: Internal Medicine

## 2017-12-28 DIAGNOSIS — Z872 Personal history of diseases of the skin and subcutaneous tissue: Secondary | ICD-10-CM | POA: Diagnosis not present

## 2017-12-28 DIAGNOSIS — L570 Actinic keratosis: Secondary | ICD-10-CM | POA: Diagnosis not present

## 2017-12-28 DIAGNOSIS — Z1283 Encounter for screening for malignant neoplasm of skin: Secondary | ICD-10-CM | POA: Diagnosis not present

## 2017-12-28 DIAGNOSIS — L578 Other skin changes due to chronic exposure to nonionizing radiation: Secondary | ICD-10-CM | POA: Diagnosis not present

## 2017-12-28 DIAGNOSIS — H61002 Unspecified perichondritis of left external ear: Secondary | ICD-10-CM | POA: Diagnosis not present

## 2017-12-31 ENCOUNTER — Ambulatory Visit (INDEPENDENT_AMBULATORY_CARE_PROVIDER_SITE_OTHER): Payer: Medicare Other | Admitting: Internal Medicine

## 2017-12-31 ENCOUNTER — Encounter: Payer: Self-pay | Admitting: Internal Medicine

## 2017-12-31 DIAGNOSIS — E039 Hypothyroidism, unspecified: Secondary | ICD-10-CM | POA: Diagnosis not present

## 2017-12-31 DIAGNOSIS — F439 Reaction to severe stress, unspecified: Secondary | ICD-10-CM

## 2017-12-31 DIAGNOSIS — E7800 Pure hypercholesterolemia, unspecified: Secondary | ICD-10-CM

## 2017-12-31 DIAGNOSIS — K2100 Gastro-esophageal reflux disease with esophagitis, without bleeding: Secondary | ICD-10-CM

## 2017-12-31 DIAGNOSIS — E1142 Type 2 diabetes mellitus with diabetic polyneuropathy: Secondary | ICD-10-CM | POA: Diagnosis not present

## 2017-12-31 DIAGNOSIS — I10 Essential (primary) hypertension: Secondary | ICD-10-CM

## 2017-12-31 DIAGNOSIS — Z9109 Other allergy status, other than to drugs and biological substances: Secondary | ICD-10-CM

## 2017-12-31 DIAGNOSIS — E538 Deficiency of other specified B group vitamins: Secondary | ICD-10-CM

## 2017-12-31 NOTE — Progress Notes (Signed)
Patient ID: Julie Hoover, female   DOB: [birth year], 84 y.o.   MRN: [id number]   Subjective:    Patient ID: Julie Hoover, female    DOB: 09-04-1929, 84 y.o.   MRN: [id number]  HPI  Patient here for a scheduled follow up.  She reports she is doing relatively well.  Still with increased stress trying to cope with her son's death.  They have had multiple deaths in the family (recently).  Discussed with her.  She overall feels she is handling things relatively well.  Does not feel she needs any further intervention.  Tries to stay active.  No chest pain.  No sob.  No acid reflux.  No abdominal pain.  Bowels moving.  Gets her eyes checked q 6 months.     Past Medical History:  Diagnosis Date  . Arthritis   . Breast cancer ([place]) 1986   s/p right lumpectomy, XRT and Tamoxifen, mastectomy  . Cancer ([place])    skin ca  . Chicken pox   . Colon polyps   . Diabetes ([place])   . Diverticulosis   . Environmental allergies   . GERD (gastroesophageal reflux disease)   . Glaucoma   . Hypercholesterolemia   . Hyperglycemia   . Hypertension   . Hypothyroidism   . Osteopenia   . Peripheral neuropathy   . Personal history of radiation therapy 1986  . Urethral stricture    Past Surgical History:  Procedure Laterality Date  . BREAST BIOPSY Right 6/86   lumpectomy-positive/rad  . BREAST LUMPECTOMY  1986   right  . ESOPHAGOGASTRODUODENOSCOPY (EGD) WITH PROPOFOL N/A 12/17/2015   Procedure: ESOPHAGOGASTRODUODENOSCOPY (EGD) WITH PROPOFOL;  Surgeon: Lollie Sails, MD;  Location: [facility] ENDOSCOPY;  Service: Endoscopy;  Laterality: N/A;  . EYE SURGERY     Bilateral cataracts removed  . KNEE SURGERY  06/2008   torn meniscus, right  . KNEE SURGERY  2012   torn menisucs, right  . MASTECTOMY Right 1993   right, recurrence  . TUBAL LIGATION    . URETHRAL DILATION     Family History  Problem Relation Age of Onset  . Heart disease Father   . Hypertension Father   . COPD Mother   .  Hypertension Brother        x2  . Hypercholesterolemia Brother        x2  . Hypertension Sister   . Hypercholesterolemia Sister   . Diabetes Mellitus II Sister   . Cancer Unknown        mouth, aunt  . Colon cancer Neg Hx   . Breast cancer Neg Hx    Social History   Socioeconomic History  . Marital status: Widowed    Spouse name: Not on file  . Number of children: 2  . Years of education: Not on file  . Highest education level: Not on file  Occupational History  . Not on file  Social Needs  . Financial resource strain: Not on file  . Food insecurity:    Worry: Not on file    Inability: Not on file  . Transportation needs:    Medical: Not on file    Non-medical: Not on file  Tobacco Use  . Smoking status: Never Smoker  . Smokeless tobacco: Never Used  Substance and Sexual Activity  . Alcohol use: No    Alcohol/week: 0.0 standard drinks  . Drug use: No  . Sexual activity: Never  Lifestyle  . Physical activity:  Days per week: Not on file    Minutes per session: Not on file  . Stress: Not on file  Relationships  . Social connections:    Talks on phone: Not on file    Gets together: Not on file    Attends religious service: Not on file    Active member of club or organization: Not on file    Attends meetings of clubs or organizations: Not on file    Relationship status: Not on file  Other Topics Concern  . Not on file  Social History Narrative  . Not on file    Outpatient Encounter Medications as of 12/31/2017  Medication Sig  . amLODipine (NORVASC) 5 MG tablet TAKE 1 TABLET EVERY DAY  . aspirin 81 MG tablet Take 81 mg by mouth daily.  . Blood Glucose Monitoring Suppl (ONETOUCH VERIO IQ SYSTEM) W/DEVICE KIT Use as directed Dx: E11.9 (Patient taking differently: Check sugars twice a day, twice a week Dx: E11.9)  . Calcium Carbonate-Vitamin D 600-400 MG-UNIT chew tablet Chew 1 tablet by mouth 2 (two) times daily.  . cetirizine (ZYRTEC) 10 MG tablet Take 10 mg  by mouth daily.  . Chromium-Cinnamon (CINNAMON PLUS CHROMIUM PO) Take by mouth.  . Cyanocobalamin (VITAMIN B-12 IJ) Inject as directed once. monthly  . desoximetasone (TOPICORT) 0.05 % cream   . dorzolamide-timolol (COSOPT) 22.3-6.8 MG/ML ophthalmic solution Place 1 drop into the left eye 2 (two) times daily.   Marland Kitchen gabapentin (NEURONTIN) 100 MG capsule TAKE 1 CAPSULE EVERY MORNING  AND TAKE 2 CAPSULES AT BEDTIME  . glucose blood (ONETOUCH VERIO) test strip CHECK SUGAR ONCE DAILY. Dx E 11.9  . Lancets 30G MISC One touch delica; to check blood sugars twice daily; E11.9  . latanoprost (XALATAN) 0.005 % ophthalmic solution Place 1 drop into the left eye at bedtime.  Marland Kitchen levothyroxine (SYNTHROID, LEVOTHROID) 50 MCG tablet TAKE 1 TABLET EVERY DAY  . omeprazole (PRILOSEC) 20 MG capsule TAKE 1 CAPSULE TWICE DAILY  . simvastatin (ZOCOR) 10 MG tablet TAKE 1 TABLET EVERY DAY  . temazepam (RESTORIL) 30 MG capsule Take 1 capsule (30 mg total) by mouth at bedtime as needed.  . [DISCONTINUED] Calcium Carbonate-Vitamin D (CALCIUM 600+D) 600-400 MG-UNIT per tablet Take 1 tablet 3 (three) times daily by mouth.    No facility-administered encounter medications on file as of 12/31/2017.     Review of Systems  Constitutional: Negative for appetite change and unexpected weight change.  HENT: Negative for congestion and sinus pressure.   Respiratory: Negative for cough, chest tightness and shortness of breath.   Cardiovascular: Negative for chest pain, palpitations and leg swelling.  Gastrointestinal: Negative for abdominal pain, diarrhea, nausea and vomiting.  Genitourinary: Negative for difficulty urinating and dysuria.  Musculoskeletal: Negative for joint swelling and myalgias.  Skin: Negative for color change and rash.  Neurological: Negative for dizziness, light-headedness and headaches.  Psychiatric/Behavioral: Negative for agitation and dysphoric mood.       Objective:    Physical Exam  Constitutional:  She appears well-developed and well-nourished. No distress.  HENT:  Nose: Nose normal.  Mouth/Throat: Oropharynx is clear and moist.  Neck: Neck supple. No thyromegaly present.  Cardiovascular: Normal rate and regular rhythm.  Pulmonary/Chest: Breath sounds normal. No respiratory distress. She has no wheezes.  Abdominal: Soft. Bowel sounds are normal. There is no tenderness.  Musculoskeletal: She exhibits no edema or tenderness.  Lymphadenopathy:    She has no cervical adenopathy.  Skin: No rash noted. No erythema.  Psychiatric: She has a normal mood and affect. Her behavior is normal.    BP 136/78 (BP Location: Left Arm, Patient Position: Sitting, Cuff Size: Normal)   Pulse 66   Temp 98.3 F (36.8 C) (Oral)   Resp 18   Wt 152 lb 9.6 oz (69.2 kg)   SpO2 97%   BMI 25.39 kg/m  Wt Readings from Last 3 Encounters:  12/31/17 152 lb 9.6 oz (69.2 kg)  10/28/17 151 lb 3.2 oz (68.6 kg)  06/24/17 155 lb 9.6 oz (70.6 kg)     Lab Results  Component Value Date   WBC 9.2 06/22/2017   HGB 13.8 06/22/2017   HCT 41.8 06/22/2017   PLT 224.0 06/22/2017   GLUCOSE 107 (H) 10/26/2017   CHOL 174 10/26/2017   TRIG 60.0 10/26/2017   HDL 75.90 10/26/2017   LDLCALC 86 10/26/2017   ALT 9 10/26/2017   AST 12 10/26/2017   NA 141 10/26/2017   K 4.5 10/26/2017   CL 104 10/26/2017   CREATININE 0.90 10/26/2017   BUN 14 10/26/2017   CO2 29 10/26/2017   TSH 3.52 10/26/2017   HGBA1C 6.6 (H) 10/26/2017   MICROALBUR 1.1 06/22/2017    Mm 3d Screen Breast Uni Left  Result Date: 11/16/2017 CLINICAL DATA:  Screening. EXAM: DIGITAL SCREENING UNILATERAL LEFT MAMMOGRAM WITH CAD AND TOMO COMPARISON:  Previous exam(s). ACR Breast Density Category b: There are scattered areas of fibroglandular density. FINDINGS: The patient has had a right mastectomy. There are no findings suspicious for malignancy. Images were processed with CAD. IMPRESSION: No mammographic evidence of malignancy. A result letter of this  screening mammogram will be mailed directly to the patient. RECOMMENDATION: Screening mammogram in one year.  (Code:SM-L-51M) BI-RADS CATEGORY  1: Negative. Electronically Signed   By: Fidela Salisbury M.D.   On: 11/16/2017 15:02       Assessment & Plan:   Problem List Items Addressed This Visit    B12 deficiency    Continue B12 injections.        Environmental allergies    Controlled on current regimen.        GERD (gastroesophageal reflux disease)    Controlled on prilosec.  Follow.        Hypercholesterolemia    On simvastatin.  Low cholesterol diet and exercise.  Follow lipid panel and liver function tests.        Relevant Orders   Hepatic function panel   Lipid panel   Hypertension    Blood pressure under good control.  Continue same medication regimen.  Follow pressures.  Follow metabolic panel.        Relevant Orders   Basic metabolic panel   Hypothyroidism    On thyroid replacement.  Follow tsh.       Stress    Increased stress as outlined.  Discussed with her today.  She has good support.  Does not feel needs anything more at this time.  Follow.        Type 2 diabetes mellitus with diabetic polyneuropathy, without long-term current use of insulin (HCC)    Low carb diet and exercise.  Follow met b and a1c.        Relevant Orders   Hemoglobin A1c       Einar Pheasant, MD

## 2018-01-03 ENCOUNTER — Encounter: Payer: Self-pay | Admitting: Internal Medicine

## 2018-01-03 NOTE — Assessment & Plan Note (Signed)
On simvastatin.  Low cholesterol diet and exercise.  Follow lipid panel and liver function tests.   

## 2018-01-03 NOTE — Assessment & Plan Note (Signed)
On thyroid replacement.  Follow tsh.  

## 2018-01-03 NOTE — Assessment & Plan Note (Signed)
Increased stress as outlined.  Discussed with her today.  She has good support.  Does not feel needs anything more at this time.  Follow.   

## 2018-01-03 NOTE — Assessment & Plan Note (Signed)
Blood pressure under good control.  Continue same medication regimen.  Follow pressures.  Follow metabolic panel.   

## 2018-01-03 NOTE — Assessment & Plan Note (Signed)
Controlled on current regimen.   

## 2018-01-03 NOTE — Assessment & Plan Note (Signed)
Low carb diet and exercise.  Follow met b and a1c.   

## 2018-01-03 NOTE — Assessment & Plan Note (Signed)
Continue B12 injections.   

## 2018-01-03 NOTE — Assessment & Plan Note (Signed)
Controlled on prilosec.  Follow.  

## 2018-01-05 ENCOUNTER — Ambulatory Visit (INDEPENDENT_AMBULATORY_CARE_PROVIDER_SITE_OTHER): Payer: Medicare Other

## 2018-01-05 DIAGNOSIS — E538 Deficiency of other specified B group vitamins: Secondary | ICD-10-CM | POA: Diagnosis not present

## 2018-01-05 DIAGNOSIS — Z23 Encounter for immunization: Secondary | ICD-10-CM

## 2018-01-05 MED ORDER — CYANOCOBALAMIN 1000 MCG/ML IJ SOLN
1000.0000 ug | Freq: Once | INTRAMUSCULAR | Status: AC
  Administered 2018-01-05: 1000 ug via INTRAMUSCULAR

## 2018-01-05 NOTE — Progress Notes (Signed)
Patient comes in for B 12 injection and flu shot . Administer B12 into  left  deltoid.  Flu shot administered into right deltoid. Patient tolerated injection well.

## 2018-01-25 ENCOUNTER — Telehealth: Payer: Self-pay

## 2018-01-25 NOTE — Telephone Encounter (Signed)
Copied from [place] [phone]. Topic: General - Other >> Jan 25, 2018  9:50 AM Lennox Solders wrote: Reason for CRM: pt is calling about a bill and would like to speak with ashley. I do not know the DOS she is inquiring about

## 2018-02-08 ENCOUNTER — Ambulatory Visit (INDEPENDENT_AMBULATORY_CARE_PROVIDER_SITE_OTHER): Payer: Medicare Other

## 2018-02-08 DIAGNOSIS — E538 Deficiency of other specified B group vitamins: Secondary | ICD-10-CM

## 2018-02-08 MED ORDER — CYANOCOBALAMIN 1000 MCG/ML IJ SOLN
1000.0000 ug | Freq: Once | INTRAMUSCULAR | Status: AC
  Administered 2018-02-08: 1000 ug via INTRAMUSCULAR

## 2018-02-08 NOTE — Patient Instructions (Addendum)
Pt came in today for b12 inj. Given in L deltoid. Tolerated well, no complaints or concerns.

## 2018-02-09 ENCOUNTER — Ambulatory Visit: Payer: Self-pay

## 2018-02-09 DIAGNOSIS — H401133 Primary open-angle glaucoma, bilateral, severe stage: Secondary | ICD-10-CM | POA: Diagnosis not present

## 2018-02-15 ENCOUNTER — Telehealth: Payer: Self-pay | Admitting: Internal Medicine

## 2018-02-15 ENCOUNTER — Other Ambulatory Visit: Payer: Self-pay

## 2018-02-15 MED ORDER — GLUCOSE BLOOD VI STRP: ORAL_STRIP | 3 refills | Status: DC

## 2018-02-15 MED ORDER — LANCETS 30G MISC: 3 refills | Status: DC

## 2018-02-15 NOTE — Telephone Encounter (Signed)
rx sent in 

## 2018-02-15 NOTE — Telephone Encounter (Unsigned)
Copied from [place] [id number]. Topic: Quick Communication - Rx Refill/Question >> Feb 15, 2018 11:31 AM Judyann Munson wrote: Medication: glucose blood (ONETOUCH VERIO) test strip   Lancets 30G MISC    Has the patient contacted their pharmacy? No   Preferred Pharmacy (with phone number or street name): [place], [place] [phone] (Phone) [phone] (Fax)    Agent: Please be advised that RX refills may take up to 3 business days. We ask that you follow-up with your pharmacy.

## 2018-02-19 DIAGNOSIS — H401133 Primary open-angle glaucoma, bilateral, severe stage: Secondary | ICD-10-CM | POA: Diagnosis not present

## 2018-03-02 ENCOUNTER — Other Ambulatory Visit (INDEPENDENT_AMBULATORY_CARE_PROVIDER_SITE_OTHER): Payer: Medicare Other

## 2018-03-02 DIAGNOSIS — E7800 Pure hypercholesterolemia, unspecified: Secondary | ICD-10-CM | POA: Diagnosis not present

## 2018-03-02 DIAGNOSIS — I10 Essential (primary) hypertension: Secondary | ICD-10-CM | POA: Diagnosis not present

## 2018-03-02 DIAGNOSIS — E1142 Type 2 diabetes mellitus with diabetic polyneuropathy: Secondary | ICD-10-CM | POA: Diagnosis not present

## 2018-03-02 LAB — BASIC METABOLIC PANEL
BUN: 20 mg/dL (ref 6–23)
CHLORIDE: 106 meq/L (ref 96–112)
CO2: 28 mEq/L (ref 19–32)
Calcium: 9.5 mg/dL (ref 8.4–10.5)
Creatinine, Ser: 0.91 mg/dL (ref 0.40–1.20)
GFR: 62.24 mL/min (ref >60.00)
Glucose, Bld: 106 mg/dL — ABNORMAL HIGH (ref 70–99)
Potassium: 4.1 mEq/L (ref 3.5–5.1)
SODIUM: 141 meq/L (ref 135–145)

## 2018-03-02 LAB — HEPATIC FUNCTION PANEL
ALK PHOS: 47 U/L (ref 39–117)
ALT: 8 U/L (ref 0–35)
AST: 11 U/L (ref 0–37)
Albumin: 4.3 g/dL (ref 3.5–5.2)
BILIRUBIN DIRECT: 0.1 mg/dL (ref 0.0–0.3)
Total Bilirubin: 0.6 mg/dL (ref 0.2–1.2)
Total Protein: 6.9 g/dL (ref 6.0–8.3)

## 2018-03-02 LAB — LIPID PANEL
Cholesterol: 159 mg/dL (ref 0–200)
HDL: 64.7 mg/dL (ref >39.00)
LDL CALC: 84 mg/dL (ref 0–99)
NONHDL: 93.98
Total CHOL/HDL Ratio: 2
Triglycerides: 49 mg/dL (ref 0.0–149.0)
VLDL: 9.8 mg/dL (ref 0.0–40.0)

## 2018-03-02 LAB — HEMOGLOBIN A1C: HEMOGLOBIN A1C: 6.6 % — AB (ref 4.6–6.5)

## 2018-03-03 ENCOUNTER — Other Ambulatory Visit: Payer: Self-pay | Admitting: Internal Medicine

## 2018-03-03 ENCOUNTER — Encounter: Payer: Self-pay | Admitting: Internal Medicine

## 2018-03-04 ENCOUNTER — Ambulatory Visit (INDEPENDENT_AMBULATORY_CARE_PROVIDER_SITE_OTHER): Payer: Medicare Other | Admitting: Internal Medicine

## 2018-03-04 ENCOUNTER — Encounter: Payer: Self-pay | Admitting: Internal Medicine

## 2018-03-04 DIAGNOSIS — F439 Reaction to severe stress, unspecified: Secondary | ICD-10-CM

## 2018-03-04 DIAGNOSIS — E1142 Type 2 diabetes mellitus with diabetic polyneuropathy: Secondary | ICD-10-CM | POA: Diagnosis not present

## 2018-03-04 DIAGNOSIS — Z9109 Other allergy status, other than to drugs and biological substances: Secondary | ICD-10-CM

## 2018-03-04 DIAGNOSIS — K2100 Gastro-esophageal reflux disease with esophagitis, without bleeding: Secondary | ICD-10-CM

## 2018-03-04 DIAGNOSIS — E538 Deficiency of other specified B group vitamins: Secondary | ICD-10-CM | POA: Diagnosis not present

## 2018-03-04 DIAGNOSIS — Z853 Personal history of malignant neoplasm of breast: Secondary | ICD-10-CM | POA: Diagnosis not present

## 2018-03-04 DIAGNOSIS — E7800 Pure hypercholesterolemia, unspecified: Secondary | ICD-10-CM

## 2018-03-04 DIAGNOSIS — G6289 Other specified polyneuropathies: Secondary | ICD-10-CM | POA: Diagnosis not present

## 2018-03-04 DIAGNOSIS — E039 Hypothyroidism, unspecified: Secondary | ICD-10-CM

## 2018-03-04 DIAGNOSIS — I10 Essential (primary) hypertension: Secondary | ICD-10-CM | POA: Diagnosis not present

## 2018-03-04 NOTE — Patient Instructions (Signed)
Oral B12 1000mcg per day.  ?

## 2018-03-04 NOTE — Progress Notes (Signed)
Patient ID: Julie Hoover, female   DOB: May 05, 1929, 84 y.o.   MRN: [id number]   Subjective:    Patient ID: Julie Hoover, female    DOB: [birth year], 84 y.o.   MRN: [id number]  HPI  Patient here for a scheduled follow up.  She reports she is doing relatively well.  Handling stress.  Has good support.  Discussed with her today.  No chest pain.  Tries to stay active.  No sob.  No acid reflux.  No abdominal pain.  Bowels moving.  No urine change.  Reviewed outside blood sugar readings.  AM sugars averaging 90-110 and PM sugars averaging 140-160.  Discussed lab results.  Wants to change to oral B12 and stop injections.     Past Medical History:  Diagnosis Date  . Arthritis   . Breast cancer ([place]) 1986   s/p right lumpectomy, XRT and Tamoxifen, mastectomy  . Cancer ([place])    skin ca  . Chicken pox   . Colon polyps   . Diabetes ([place])   . Diverticulosis   . Environmental allergies   . GERD (gastroesophageal reflux disease)   . Glaucoma   . Hypercholesterolemia   . Hyperglycemia   . Hypertension   . Hypothyroidism   . Osteopenia   . Peripheral neuropathy   . Personal history of radiation therapy 1986  . Urethral stricture    Past Surgical History:  Procedure Laterality Date  . BREAST BIOPSY Right 6/86   lumpectomy-positive/rad  . BREAST LUMPECTOMY  1986   right  . ESOPHAGOGASTRODUODENOSCOPY (EGD) WITH PROPOFOL N/A 12/17/2015   Procedure: ESOPHAGOGASTRODUODENOSCOPY (EGD) WITH PROPOFOL;  Surgeon: Lollie Sails, MD;  Location: [facility] ENDOSCOPY;  Service: Endoscopy;  Laterality: N/A;  . EYE SURGERY     Bilateral cataracts removed  . KNEE SURGERY  06/2008   torn meniscus, right  . KNEE SURGERY  2012   torn menisucs, right  . MASTECTOMY Right 1993   right, recurrence  . TUBAL LIGATION    . URETHRAL DILATION     Family History  Problem Relation Age of Onset  . Heart disease Father   . Hypertension Father   . COPD Mother   . Hypertension Brother        x2  .  Hypercholesterolemia Brother        x2  . Hypertension Sister   . Hypercholesterolemia Sister   . Diabetes Mellitus II Sister   . Cancer Unknown        mouth, aunt  . Colon cancer Neg Hx   . Breast cancer Neg Hx    Social History   Socioeconomic History  . Marital status: Widowed    Spouse name: Not on file  . Number of children: 2  . Years of education: Not on file  . Highest education level: Not on file  Occupational History  . Not on file  Social Needs  . Financial resource strain: Not on file  . Food insecurity:    Worry: Not on file    Inability: Not on file  . Transportation needs:    Medical: Not on file    Non-medical: Not on file  Tobacco Use  . Smoking status: Never Smoker  . Smokeless tobacco: Never Used  Substance and Sexual Activity  . Alcohol use: No    Alcohol/week: 0.0 standard drinks  . Drug use: No  . Sexual activity: Never  Lifestyle  . Physical activity:    Days per week: Not on file  Minutes per session: Not on file  . Stress: Not on file  Relationships  . Social connections:    Talks on phone: Not on file    Gets together: Not on file    Attends religious service: Not on file    Active member of club or organization: Not on file    Attends meetings of clubs or organizations: Not on file    Relationship status: Not on file  Other Topics Concern  . Not on file  Social History Narrative  . Not on file    Outpatient Encounter Medications as of 03/04/2018  Medication Sig  . amLODipine (NORVASC) 5 MG tablet TAKE 1 TABLET EVERY DAY  . aspirin 81 MG tablet Take 81 mg by mouth daily.  . Blood Glucose Monitoring Suppl (ONETOUCH VERIO IQ SYSTEM) W/DEVICE KIT Use as directed Dx: E11.9 (Patient taking differently: Check sugars twice a day, twice a week Dx: E11.9)  . Calcium Carbonate-Vitamin D 600-400 MG-UNIT chew tablet Chew 1 tablet by mouth 2 (two) times daily.  . cetirizine (ZYRTEC) 10 MG tablet Take 10 mg by mouth daily.  .  Chromium-Cinnamon (CINNAMON PLUS CHROMIUM PO) Take by mouth.  . Cyanocobalamin (VITAMIN B-12 IJ) Inject as directed once. monthly  . desoximetasone (TOPICORT) 0.05 % cream   . dorzolamide-timolol (COSOPT) 22.3-6.8 MG/ML ophthalmic solution Place 1 drop into the left eye 2 (two) times daily.   Marland Kitchen gabapentin (NEURONTIN) 100 MG capsule TAKE 1 CAPSULE EVERY MORNING  AND TAKE 2 CAPSULES AT BEDTIME  . glucose blood (ONETOUCH VERIO) test strip CHECK SUGAR ONCE DAILY. Dx E 11.9  . Lancets 30G MISC One touch delica; to check blood sugars twice daily; E11.9  . latanoprost (XALATAN) 0.005 % ophthalmic solution Place 1 drop into the left eye at bedtime.  Marland Kitchen levothyroxine (SYNTHROID, LEVOTHROID) 50 MCG tablet TAKE 1 TABLET EVERY DAY  . omeprazole (PRILOSEC) 20 MG capsule TAKE 1 CAPSULE TWICE DAILY  . simvastatin (ZOCOR) 10 MG tablet TAKE 1 TABLET EVERY DAY  . temazepam (RESTORIL) 30 MG capsule Take 1 capsule (30 mg total) by mouth at bedtime as needed.   No facility-administered encounter medications on file as of 03/04/2018.     Review of Systems  Constitutional: Negative for appetite change and unexpected weight change.  HENT: Negative for congestion and sinus pressure.   Respiratory: Negative for cough, chest tightness and shortness of breath.   Cardiovascular: Negative for chest pain, palpitations and leg swelling.  Gastrointestinal: Negative for abdominal pain, diarrhea, nausea and vomiting.  Genitourinary: Negative for difficulty urinating and dysuria.  Musculoskeletal: Negative for joint swelling and myalgias.  Skin: Negative for color change and rash.  Neurological: Negative for dizziness, light-headedness and headaches.  Psychiatric/Behavioral: Negative for agitation and dysphoric mood.       Objective:    Physical Exam  Constitutional: She appears well-developed and well-nourished. No distress.  HENT:  Nose: Nose normal.  Mouth/Throat: Oropharynx is clear and moist.  Neck: Neck  supple. No thyromegaly present.  Cardiovascular: Normal rate and regular rhythm.  Pulmonary/Chest: Breath sounds normal. No respiratory distress. She has no wheezes.  Abdominal: Soft. Bowel sounds are normal. There is no tenderness.  Musculoskeletal: She exhibits no edema or tenderness.  Feet:  No lesions.  DP pulses palpable and equal bilaterally.  Sensation intact to pin prick and light touch.    Lymphadenopathy:    She has no cervical adenopathy.  Skin: No rash noted. No erythema.  Psychiatric: She has a normal mood and  affect. Her behavior is normal.    BP 134/74 (BP Location: Left Arm, Patient Position: Sitting, Cuff Size: Normal)   Pulse 71   Temp 97.8 F (36.6 C) (Oral)   Resp 18   Wt 153 lb 12.8 oz (69.8 kg)   SpO2 97%   BMI 25.59 kg/m  Wt Readings from Last 3 Encounters:  03/04/18 153 lb 12.8 oz (69.8 kg)  12/31/17 152 lb 9.6 oz (69.2 kg)  10/28/17 151 lb 3.2 oz (68.6 kg)     Lab Results  Component Value Date   WBC 9.2 06/22/2017   HGB 13.8 06/22/2017   HCT 41.8 06/22/2017   PLT 224.0 06/22/2017   GLUCOSE 106 (H) 03/02/2018   CHOL 159 03/02/2018   TRIG 49.0 03/02/2018   HDL 64.70 03/02/2018   LDLCALC 84 03/02/2018   ALT 8 03/02/2018   AST 11 03/02/2018   NA 141 03/02/2018   K 4.1 03/02/2018   CL 106 03/02/2018   CREATININE 0.91 03/02/2018   BUN 20 03/02/2018   CO2 28 03/02/2018   TSH 3.52 10/26/2017   HGBA1C 6.6 (H) 03/02/2018   MICROALBUR 1.1 06/22/2017    Mm 3d Screen Breast Uni Left  Result Date: 11/16/2017 CLINICAL DATA:  Screening. EXAM: DIGITAL SCREENING UNILATERAL LEFT MAMMOGRAM WITH CAD AND TOMO COMPARISON:  Previous exam(s). ACR Breast Density Category b: There are scattered areas of fibroglandular density. FINDINGS: The patient has had a right mastectomy. There are no findings suspicious for malignancy. Images were processed with CAD. IMPRESSION: No mammographic evidence of malignancy. A result letter of this screening mammogram will be mailed  directly to the patient. RECOMMENDATION: Screening mammogram in one year.  (Code:SM-L-71M) BI-RADS CATEGORY  1: Negative. Electronically Signed   By: Fidela Salisbury M.D.   On: 11/16/2017 15:02       Assessment & Plan:   Problem List Items Addressed This Visit    B12 deficiency    Wants to stop b12 injections and change to oral b12.  Start oral B12 1068mg q day.  Recheck B12 with next labs.        Environmental allergies    Controlled.        GERD (gastroesophageal reflux disease)    Controlled on current regimen.  Follow.        History of breast cancer    Mammogram 11/16/17 - Bir[facility] I.       Hypercholesterolemia    On simvastatin.  Low cholesterol diet and exercise.  Follow lipid panel and liver function tests.        Relevant Orders   Hepatic function panel   Lipid panel   Hypertension    Blood pressure under good control.  Continue same medication regimen.  Follow pressures.  Follow metabolic panel.        Relevant Orders   CBC with Differential/Platelet   TSH   Hypothyroidism    On thyroid replacement.  Follow tsh.       Peripheral neuropathy    Stable on gabapentin.        Stress    Discussed with her today.  Overall she feels she is handling things relatively well.  Has good support.  Follow.        Type 2 diabetes mellitus with diabetic polyneuropathy, without long-term current use of insulin (HCC)    Low carb diet and exercise.  Follow met b and a1c.  Sugars as outlined.        Relevant Orders   Hemoglobin  W2B   Basic metabolic panel   Microalbumin / creatinine urine ratio       Einar Pheasant, MD

## 2018-03-05 ENCOUNTER — Other Ambulatory Visit: Payer: Self-pay

## 2018-03-07 ENCOUNTER — Encounter: Payer: Self-pay | Admitting: Internal Medicine

## 2018-03-07 NOTE — Assessment & Plan Note (Signed)
Mammogram 11/16/17 - Birads I.

## 2018-03-07 NOTE — Assessment & Plan Note (Signed)
Wants to stop b12 injections and change to oral b12.  Start oral B12 1010mcg q day.  Recheck B12 with next labs.

## 2018-03-07 NOTE — Assessment & Plan Note (Signed)
Discussed with her today.  Overall she feels she is handling things relatively well.  Has good support.  Follow.

## 2018-03-07 NOTE — Assessment & Plan Note (Signed)
Blood pressure under good control.  Continue same medication regimen.  Follow pressures.  Follow metabolic panel.   

## 2018-03-07 NOTE — Assessment & Plan Note (Signed)
Controlled on current regimen.  Follow.  

## 2018-03-07 NOTE — Assessment & Plan Note (Signed)
On simvastatin.  Low cholesterol diet and exercise.  Follow lipid panel and liver function tests.   

## 2018-03-07 NOTE — Assessment & Plan Note (Signed)
Controlled.  

## 2018-03-07 NOTE — Assessment & Plan Note (Signed)
Stable on gabapentin.   

## 2018-03-07 NOTE — Assessment & Plan Note (Signed)
Low carb diet and exercise.  Follow met b and a1c.  Sugars as outlined.   

## 2018-03-07 NOTE — Assessment & Plan Note (Signed)
On thyroid replacement.  Follow tsh.  

## 2018-03-11 ENCOUNTER — Ambulatory Visit: Payer: Self-pay

## 2018-04-18 ENCOUNTER — Other Ambulatory Visit: Payer: Self-pay | Admitting: Internal Medicine

## 2018-04-28 ENCOUNTER — Telehealth: Payer: Self-pay

## 2018-04-28 NOTE — Telephone Encounter (Signed)
Patient aware.

## 2018-04-28 NOTE — Telephone Encounter (Signed)
Reviewed sugar readings.  Overall sugars appear do be doing ok.  Continue low carb diet and exercise.  Continue to follow.

## 2018-04-28 NOTE — Telephone Encounter (Signed)
Blood sugar readings placed in your blue folder for review

## 2018-04-29 ENCOUNTER — Ambulatory Visit: Payer: Self-pay | Admitting: Family Medicine

## 2018-05-03 ENCOUNTER — Other Ambulatory Visit: Payer: Self-pay | Admitting: Internal Medicine

## 2018-05-12 ENCOUNTER — Other Ambulatory Visit: Payer: Self-pay | Admitting: Internal Medicine

## 2018-06-22 ENCOUNTER — Other Ambulatory Visit: Payer: Self-pay | Admitting: Internal Medicine

## 2018-06-28 ENCOUNTER — Other Ambulatory Visit: Payer: Self-pay | Admitting: Internal Medicine

## 2018-07-02 ENCOUNTER — Other Ambulatory Visit: Payer: Self-pay | Admitting: Internal Medicine

## 2018-07-02 NOTE — Telephone Encounter (Signed)
Copied from [place]. Topic: Quick Communication - Rx Refill/Question >> Jul 02, 2018  9:12 AM Percell Belt A wrote: Medication: temazepam (RESTORIL) 30 MG capsule [[id number]  Has the patient contacted their pharmacy? No.- (Agent: If no, request that the patient contact the pharmacy for the refill.) (Agent: If yes, when and what did the pharmacy advise?)  Preferred Pharmacy (with phone number or street name): [facility], [place] - [place] [phone] (Phone)   Agent: Please be advised that RX refills may take up to 3 business days. We ask that you follow-up with your pharmacy.

## 2018-07-02 NOTE — Telephone Encounter (Signed)
Requested medication (s) are due for refill today: yes  Requested medication (s) are on the active medication list: yes  Last refill:  09/18/17  Future visit scheduled: yes  Notes to clinic:  Medication not delegated to NT for refill   Requested Prescriptions  Pending Prescriptions Disp Refills   temazepam (RESTORIL) 30 MG capsule 30 capsule 0    Sig: Take 1 capsule (30 mg total) by mouth at bedtime as needed.     Not Delegated - Psychiatry:  Anxiolytics/Hypnotics Failed - 07/02/2018  9:42 AM      Failed - This refill cannot be delegated      Failed - Urine Drug Screen completed in last 360 days.      Passed - Valid encounter within last 6 months    Recent Outpatient Visits          4 months ago B12 deficiency   [facility], [place], MD   6 months ago B12 deficiency   [facility] Einar Pheasant, MD   8 months ago Routine general medical examination at a health care facility   [facility], [place] Patient, MD   1 year ago Left shoulder pain, unspecified chronicity   [facility] [place], [place] Patient, MD   1 year ago B12 deficiency   [place], [place]      Future Appointments            In 1 week Einar Pheasant, MD [place], [place]

## 2018-07-04 ENCOUNTER — Encounter: Payer: Self-pay | Admitting: Internal Medicine

## 2018-07-05 ENCOUNTER — Other Ambulatory Visit: Payer: Self-pay

## 2018-07-05 MED ORDER — TEMAZEPAM 30 MG PO CAPS: 30.0000 mg | ORAL_CAPSULE | Freq: Every evening | ORAL | 0 refills | Status: DC | PRN

## 2018-07-05 NOTE — Progress Notes (Signed)
Last OV 03/04/2018 Next OV 07/09/2018 Last refill 09/18/2017   Addendum rx ok'd for temazepam #30 with no refills.    Dr Nicki Reaper

## 2018-07-05 NOTE — Progress Notes (Signed)
rx ok'd for temazepam #30 with no refills.   °

## 2018-07-05 NOTE — Telephone Encounter (Signed)
rx request sent to provider

## 2018-07-07 ENCOUNTER — Other Ambulatory Visit (INDEPENDENT_AMBULATORY_CARE_PROVIDER_SITE_OTHER): Payer: Medicare Other

## 2018-07-07 ENCOUNTER — Other Ambulatory Visit: Payer: Self-pay

## 2018-07-07 DIAGNOSIS — E1142 Type 2 diabetes mellitus with diabetic polyneuropathy: Secondary | ICD-10-CM | POA: Diagnosis not present

## 2018-07-07 DIAGNOSIS — E7800 Pure hypercholesterolemia, unspecified: Secondary | ICD-10-CM | POA: Diagnosis not present

## 2018-07-07 DIAGNOSIS — I10 Essential (primary) hypertension: Secondary | ICD-10-CM | POA: Diagnosis not present

## 2018-07-07 LAB — HEPATIC FUNCTION PANEL
ALT: 9 U/L (ref 0–35)
AST: 12 U/L (ref 0–37)
Albumin: 4.5 g/dL (ref 3.5–5.2)
Alkaline Phosphatase: 45 U/L (ref 39–117)
Bilirubin, Direct: 0.1 mg/dL (ref 0.0–0.3)
Total Bilirubin: 0.7 mg/dL (ref 0.2–1.2)
Total Protein: 7.6 g/dL (ref 6.0–8.3)

## 2018-07-07 LAB — BASIC METABOLIC PANEL
BUN: 18 mg/dL (ref 6–23)
CO2: 29 mEq/L (ref 19–32)
Calcium: 10.2 mg/dL (ref 8.4–10.5)
Chloride: 104 mEq/L (ref 96–112)
Creatinine, Ser: 0.94 mg/dL (ref 0.40–1.20)
GFR: 56.36 mL/min — ABNORMAL LOW (ref >60.00)
Glucose, Bld: 117 mg/dL — ABNORMAL HIGH (ref 70–99)
Potassium: 4.9 mEq/L (ref 3.5–5.1)
Sodium: 140 mEq/L (ref 135–145)

## 2018-07-07 LAB — MICROALBUMIN / CREATININE URINE RATIO
CREATININE, U: 90.6 mg/dL
Microalb Creat Ratio: 1.3 mg/g (ref 0.0–30.0)
Microalb, Ur: 1.2 mg/dL (ref 0.0–1.9)

## 2018-07-07 LAB — CBC WITH DIFFERENTIAL/PLATELET
Basophils Absolute: 0.1 10*3/uL (ref 0.0–0.1)
Basophils Relative: 0.7 % (ref 0.0–3.0)
Eosinophils Absolute: 0.1 10*3/uL (ref 0.0–0.7)
Eosinophils Relative: 1 % (ref 0.0–5.0)
HCT: 43 % (ref 36.0–46.0)
HEMOGLOBIN: 14.3 g/dL (ref 12.0–15.0)
Lymphocytes Relative: 25.7 % (ref 12.0–46.0)
Lymphs Abs: 2 10*3/uL (ref 0.7–4.0)
MCHC: 33.3 g/dL (ref 30.0–36.0)
MCV: 93.9 fl (ref 78.0–100.0)
Monocytes Absolute: 0.7 10*3/uL (ref 0.1–1.0)
Monocytes Relative: 8.5 % (ref 3.0–12.0)
Neutro Abs: 5 10*3/uL (ref 1.4–7.7)
Neutrophils Relative %: 64.1 % (ref 43.0–77.0)
Platelets: 232 10*3/uL (ref 150.0–400.0)
RBC: 4.58 Mil/uL (ref 3.87–5.11)
RDW: 14.1 % (ref 11.5–15.5)
WBC: 7.9 10*3/uL (ref 4.0–10.5)

## 2018-07-07 LAB — LIPID PANEL
Cholesterol: 189 mg/dL (ref 0–200)
HDL: 74 mg/dL (ref >39.00)
LDL CALC: 102 mg/dL — AB (ref 0–99)
NonHDL: 114.59
Total CHOL/HDL Ratio: 3
Triglycerides: 65 mg/dL (ref 0.0–149.0)
VLDL: 13 mg/dL (ref 0.0–40.0)

## 2018-07-07 LAB — HEMOGLOBIN A1C: Hgb A1c MFr Bld: 6.6 % — ABNORMAL HIGH (ref 4.6–6.5)

## 2018-07-07 LAB — TSH: TSH: 5.76 u[IU]/mL — AB (ref 0.35–4.50)

## 2018-07-09 ENCOUNTER — Ambulatory Visit (INDEPENDENT_AMBULATORY_CARE_PROVIDER_SITE_OTHER): Payer: Medicare Other | Admitting: Internal Medicine

## 2018-07-09 ENCOUNTER — Encounter: Payer: Self-pay | Admitting: Internal Medicine

## 2018-07-09 ENCOUNTER — Other Ambulatory Visit: Payer: Self-pay

## 2018-07-09 DIAGNOSIS — F439 Reaction to severe stress, unspecified: Secondary | ICD-10-CM

## 2018-07-09 DIAGNOSIS — H6123 Impacted cerumen, bilateral: Secondary | ICD-10-CM | POA: Diagnosis not present

## 2018-07-09 DIAGNOSIS — E1142 Type 2 diabetes mellitus with diabetic polyneuropathy: Secondary | ICD-10-CM

## 2018-07-09 DIAGNOSIS — Z9109 Other allergy status, other than to drugs and biological substances: Secondary | ICD-10-CM

## 2018-07-09 DIAGNOSIS — E7800 Pure hypercholesterolemia, unspecified: Secondary | ICD-10-CM

## 2018-07-09 DIAGNOSIS — K2100 Gastro-esophageal reflux disease with esophagitis, without bleeding: Secondary | ICD-10-CM

## 2018-07-09 DIAGNOSIS — E538 Deficiency of other specified B group vitamins: Secondary | ICD-10-CM | POA: Diagnosis not present

## 2018-07-09 DIAGNOSIS — E039 Hypothyroidism, unspecified: Secondary | ICD-10-CM

## 2018-07-09 DIAGNOSIS — R42 Dizziness and giddiness: Secondary | ICD-10-CM

## 2018-07-09 DIAGNOSIS — I10 Essential (primary) hypertension: Secondary | ICD-10-CM | POA: Diagnosis not present

## 2018-07-09 MED ORDER — ROSUVASTATIN CALCIUM 10 MG PO TABS: 10.0000 mg | ORAL_TABLET | Freq: Every day | ORAL | 1 refills | Status: DC

## 2018-07-09 NOTE — Patient Instructions (Signed)
Stop simvastatin.    Start crestor.  This is a new cholesterol medication.    Take your thyroid medication (levothyroxine) in the morning by itself.  Wait 1 hour to take your other medications.

## 2018-07-09 NOTE — Progress Notes (Addendum)
Patient ID: Julie Hoover, female   DOB: 12/20/1929, 85 y.o.   MRN: [id number]   Subjective:    Patient ID: Julie Hoover, female    DOB: 06/15/1929, 85 y.o.   MRN: [id number]  HPI  Patient here for a scheduled follow up.   She reports she is doing relatively well.  Feels good.  6 weeks ago, she had an episode of presumed inner ear.  Noticed some popping in her right ear.  Noticed with movement - room spinning.  Lasted for one day and then resolved.  No further problems with dizziness.  Wants to be checked for cerumen impaction.  Handling stress.  Overall she feels she is doing well.     Past Medical History:  Diagnosis Date  . Arthritis   . Breast cancer (HCC) 1986   s/p right lumpectomy, XRT and Tamoxifen, mastectomy  . Cancer (HCC)    skin ca  . Chicken pox   . Colon polyps   . Diabetes (HCC)   . Diverticulosis   . Environmental allergies   . GERD (gastroesophageal reflux disease)   . Glaucoma   . Hypercholesterolemia   . Hyperglycemia   . Hypertension   . Hypothyroidism   . Osteopenia   . Peripheral neuropathy   . Personal history of radiation therapy 1986  . Urethral stricture    Past Surgical History:  Procedure Laterality Date  . BREAST BIOPSY Right 6/86   lumpectomy-positive/rad  . BREAST LUMPECTOMY  1986   right  . ESOPHAGOGASTRODUODENOSCOPY (EGD) WITH PROPOFOL N/A 12/17/2015   Procedure: ESOPHAGOGASTRODUODENOSCOPY (EGD) WITH PROPOFOL;  Surgeon: Martin U Skulskie, MD;  Location: [facility] ENDOSCOPY;  Service: Endoscopy;  Laterality: N/A;  . EYE SURGERY     Bilateral cataracts removed  . KNEE SURGERY  06/2008   torn meniscus, right  . KNEE SURGERY  2012   torn menisucs, right  . MASTECTOMY Right 1993   right, recurrence  . TUBAL LIGATION    . URETHRAL DILATION     Family History  Problem Relation Age of Onset  . Heart disease Father   . Hypertension Father   . COPD Mother   . Hypertension Brother        x2  . Hypercholesterolemia Brother        x2  . Hypertension Sister   . Hypercholesterolemia Sister   . Diabetes Mellitus II Sister   . Cancer Other        mouth, aunt  . Colon cancer Neg Hx   . Breast cancer Neg Hx    Social History   Socioeconomic History  . Marital status: Widowed    Spouse name: Not on file  . Number of children: 2  . Years of education: Not on file  . Highest education level: Not on file  Occupational History  . Not on file  Social Needs  . Financial resource strain: Not on file  . Food insecurity:    Worry: Not on file    Inability: Not on file  . Transportation needs:    Medical: Not on file    Non-medical: Not on file  Tobacco Use  . Smoking status: Never Smoker  . Smokeless tobacco: Never Used  Substance and Sexual Activity  . Alcohol use: No    Alcohol/week: 0.0 standard drinks  . Drug use: No  . Sexual activity: Never  Lifestyle  . Physical activity:    Days per week: Not on file    Minutes per session: Not   on file  . Stress: Not on file  Relationships  . Social connections:    Talks on phone: Not on file    Gets together: Not on file    Attends religious service: Not on file    Active member of club or organization: Not on file    Attends meetings of clubs or organizations: Not on file    Relationship status: Not on file  Other Topics Concern  . Not on file  Social History Narrative  . Not on file    Outpatient Encounter Medications as of 07/09/2018  Medication Sig  . amLODipine (NORVASC) 5 MG tablet TAKE 1 TABLET EVERY DAY  . aspirin 81 MG tablet Take 81 mg by mouth daily.  . Blood Glucose Monitoring Suppl (ONETOUCH VERIO IQ SYSTEM) W/DEVICE KIT Use as directed Dx: E11.9 (Patient taking differently: Check sugars twice a day, twice a week Dx: E11.9)  . Calcium Carbonate-Vitamin D 600-400 MG-UNIT chew tablet Chew 1 tablet by mouth 2 (two) times daily.  . cetirizine (ZYRTEC) 10 MG tablet Take 10 mg by mouth daily.  . Chromium-Cinnamon (CINNAMON PLUS CHROMIUM PO) Take  by mouth.  . desoximetasone (TOPICORT) 0.05 % cream   . dorzolamide-timolol (COSOPT) 22.3-6.8 MG/ML ophthalmic solution Place 1 drop into the left eye 2 (two) times daily.   Marland Kitchen gabapentin (NEURONTIN) 100 MG capsule TAKE 1 CAPSULE EVERY MORNING  AND TAKE 2 CAPSULES AT BEDTIME  . glucose blood (ONETOUCH VERIO) test strip CHECK SUGAR ONCE DAILY. Dx E 11.9  . Lancets 30G MISC One touch delica; to check blood sugars twice daily; E11.9  . latanoprost (XALATAN) 0.005 % ophthalmic solution Place 1 drop into the left eye at bedtime.  Marland Kitchen levothyroxine (SYNTHROID, LEVOTHROID) 50 MCG tablet TAKE 1 TABLET EVERY DAY  . omeprazole (PRILOSEC) 20 MG capsule TAKE 1 CAPSULE TWICE DAILY  . temazepam (RESTORIL) 30 MG capsule Take 1 capsule (30 mg total) by mouth at bedtime as needed.  . [DISCONTINUED] simvastatin (ZOCOR) 10 MG tablet TAKE 1 TABLET EVERY DAY  . rosuvastatin (CRESTOR) 10 MG tablet Take 1 tablet (10 mg total) by mouth daily.  . [DISCONTINUED] Cyanocobalamin (VITAMIN B-12 IJ) Inject as directed once. monthly   No facility-administered encounter medications on file as of 07/09/2018.     Review of Systems  Constitutional: Negative for appetite change and unexpected weight change.  HENT: Negative for congestion and sinus pressure.   Respiratory: Negative for cough, chest tightness and shortness of breath.   Cardiovascular: Negative for chest pain, palpitations and leg swelling.  Gastrointestinal: Negative for abdominal pain, diarrhea, nausea and vomiting.  Genitourinary: Negative for difficulty urinating and dysuria.  Musculoskeletal: Negative for joint swelling and myalgias.  Skin: Negative for color change and rash.  Neurological: Negative for light-headedness and headaches.  Psychiatric/Behavioral: Negative for agitation and dysphoric mood.       Objective:    Physical Exam Constitutional:      General: She is not in acute distress.    Appearance: Normal appearance.  HENT:     Ears:      Comments: Cerumen impaction - left ear.  Some cerumen present - right ear.      Nose: Nose normal. No congestion.     Mouth/Throat:     Pharynx: No oropharyngeal exudate or posterior oropharyngeal erythema.  Neck:     Musculoskeletal: Neck supple. No muscular tenderness.     Thyroid: No thyromegaly.  Cardiovascular:     Rate and Rhythm: Normal rate and regular  rhythm.  Pulmonary:     Effort: No respiratory distress.     Breath sounds: Normal breath sounds. No wheezing.  Abdominal:     General: Bowel sounds are normal.     Palpations: Abdomen is soft.     Tenderness: There is no abdominal tenderness.  Musculoskeletal:        General: No swelling or tenderness.  Lymphadenopathy:     Cervical: No cervical adenopathy.  Skin:    Findings: No erythema or rash.  Neurological:     Mental Status: She is alert.  Psychiatric:        Mood and Affect: Mood normal.        Behavior: Behavior normal.     BP 120/68 (BP Location: Left Arm, Patient Position: Sitting, Cuff Size: Normal)   Pulse 63   Temp 98 F (36.7 C) (Oral)   Resp 15   Ht 5' 5" (1.651 m)   Wt 149 lb 12.8 oz (67.9 kg)   SpO2 96%   BMI 24.93 kg/m  Wt Readings from Last 3 Encounters:  07/09/18 149 lb 12.8 oz (67.9 kg)  03/04/18 153 lb 12.8 oz (69.8 kg)  12/31/17 152 lb 9.6 oz (69.2 kg)     Lab Results  Component Value Date   WBC 7.9 07/07/2018   HGB 14.3 07/07/2018   HCT 43.0 07/07/2018   PLT 232.0 07/07/2018   GLUCOSE 117 (H) 07/07/2018   CHOL 189 07/07/2018   TRIG 65.0 07/07/2018   HDL 74.00 07/07/2018   LDLCALC 102 (H) 07/07/2018   ALT 9 07/07/2018   AST 12 07/07/2018   NA 140 07/07/2018   K 4.9 07/07/2018   CL 104 07/07/2018   CREATININE 0.94 07/07/2018   BUN 18 07/07/2018   CO2 29 07/07/2018   TSH 5.76 (H) 07/07/2018   HGBA1C 6.6 (H) 07/07/2018   MICROALBUR 1.2 07/07/2018    Mm 3d Screen Breast Uni Left  Result Date: 11/16/2017 CLINICAL DATA:  Screening. EXAM: DIGITAL SCREENING UNILATERAL LEFT  MAMMOGRAM WITH CAD AND TOMO COMPARISON:  Previous exam(s). ACR Breast Density Category b: There are scattered areas of fibroglandular density. FINDINGS: The patient has had a right mastectomy. There are no findings suspicious for malignancy. Images were processed with CAD. IMPRESSION: No mammographic evidence of malignancy. A result letter of this screening mammogram will be mailed directly to the patient. RECOMMENDATION: Screening mammogram in one year.  (Code:SM-L-60M) BI-RADS CATEGORY  1: Negative. Electronically Signed   By: Fidela Salisbury M.D.   On: 11/16/2017 15:02       Assessment & Plan:   Problem List Items Addressed This Visit    B12 deficiency    Oral B12 supplements.        Cerumen impaction    Both ears irrigated.  No problems reported.  Follow.       Dizziness    Had the episode of "inner ear" as outlined.  Has a history of inner ear and states this felt similar.  Resolved. No problems now.  Ears irrigated.  Will notify me if persistent problems.        Environmental allergies    Controlled.        GERD (gastroesophageal reflux disease)    Controlled on current regimen.  Follow.        Hypercholesterolemia    Discussed lab results.  Change to crestor.  Low cholesterol diet and exercise.  Follow lipid panel and liver function tests.        Relevant  Medications   rosuvastatin (CRESTOR) 10 MG tablet   Hypertension    Blood pressure under good control.  Continue same medication regimen.  Follow pressures.  Follow metabolic panel.        Relevant Medications   rosuvastatin (CRESTOR) 10 MG tablet   Hypothyroidism    On thyroid replacement.  Slightly elevated tsh on recent check.  She has been taking her thyroid medication with other meds.  Instructed on proper way to take. Hold on adjusting the dose.  Follow tsh.       Relevant Orders   TSH   Stress    Handling stress relatively well.  Does not feel needs any further intervention.  Follow.        Type 2  diabetes mellitus with diabetic polyneuropathy, without long-term current use of insulin (HCC)    Low carb diet and exercise.  Follow met b and a1c.        Relevant Medications   rosuvastatin (CRESTOR) 10 MG tablet       Einar Pheasant, MD

## 2018-07-11 ENCOUNTER — Encounter: Payer: Self-pay | Admitting: Internal Medicine

## 2018-07-11 DIAGNOSIS — R42 Dizziness and giddiness: Secondary | ICD-10-CM | POA: Insufficient documentation

## 2018-07-11 DIAGNOSIS — H6120 Impacted cerumen, unspecified ear: Secondary | ICD-10-CM | POA: Insufficient documentation

## 2018-07-11 NOTE — Assessment & Plan Note (Addendum)
On thyroid replacement.  Slightly elevated tsh on recent check.  She has been taking her thyroid medication with other meds.  Instructed on proper way to take. Hold on adjusting the dose.  Follow tsh.

## 2018-07-11 NOTE — Assessment & Plan Note (Signed)
Had the episode of "inner ear" as outlined.  Has a history of inner ear and states this felt similar.  Resolved. No problems now.  Ears irrigated.  Will notify me if persistent problems.

## 2018-07-11 NOTE — Assessment & Plan Note (Signed)
Low carb diet and exercise.  Follow met b and a1c.   

## 2018-07-11 NOTE — Assessment & Plan Note (Addendum)
Discussed lab results.  Change to crestor.  Low cholesterol diet and exercise.  Follow lipid panel and liver function tests.

## 2018-07-11 NOTE — Assessment & Plan Note (Signed)
Oral B12 supplements.

## 2018-07-11 NOTE — Assessment & Plan Note (Signed)
Handling stress relatively well.  Does not feel needs any further intervention.  Follow.

## 2018-07-11 NOTE — Assessment & Plan Note (Signed)
Both ears irrigated.  No problems reported.  Follow.

## 2018-07-11 NOTE — Assessment & Plan Note (Signed)
Controlled on current regimen.  Follow.  

## 2018-07-11 NOTE — Assessment & Plan Note (Signed)
Controlled.  

## 2018-07-11 NOTE — Assessment & Plan Note (Signed)
Blood pressure under good control.  Continue same medication regimen.  Follow pressures.  Follow metabolic panel.   

## 2018-07-16 ENCOUNTER — Encounter: Payer: Self-pay | Admitting: Internal Medicine

## 2018-07-16 MED ORDER — ROSUVASTATIN CALCIUM 10 MG PO TABS: 10.0000 mg | ORAL_TABLET | Freq: Every day | ORAL | 1 refills | Status: DC

## 2018-07-16 NOTE — Telephone Encounter (Signed)
I have sent in the prescription to mail order.  Please just make sure she comes in for liver check.

## 2018-07-16 NOTE — Telephone Encounter (Signed)
She has only been on this medication for 7 days. Are you okay with sending a 90 day in for her?

## 2018-08-23 ENCOUNTER — Other Ambulatory Visit (INDEPENDENT_AMBULATORY_CARE_PROVIDER_SITE_OTHER): Payer: Medicare Other

## 2018-08-23 ENCOUNTER — Encounter: Payer: Self-pay | Admitting: Internal Medicine

## 2018-08-23 ENCOUNTER — Other Ambulatory Visit: Payer: Self-pay

## 2018-08-23 DIAGNOSIS — E039 Hypothyroidism, unspecified: Secondary | ICD-10-CM | POA: Diagnosis not present

## 2018-08-23 LAB — TSH: TSH: 1.41 u[IU]/mL (ref 0.35–4.50)

## 2018-08-24 ENCOUNTER — Other Ambulatory Visit: Payer: Self-pay

## 2018-09-02 ENCOUNTER — Other Ambulatory Visit: Payer: Self-pay | Admitting: *Deleted

## 2018-09-02 ENCOUNTER — Telehealth: Payer: Self-pay | Admitting: Internal Medicine

## 2018-09-02 MED ORDER — TEMAZEPAM 30 MG PO CAPS: 30.0000 mg | ORAL_CAPSULE | Freq: Every evening | ORAL | 0 refills | Status: DC | PRN

## 2018-09-02 MED ORDER — LEVOTHYROXINE SODIUM 50 MCG PO TABS: 50.0000 ug | ORAL_TABLET | Freq: Every day | ORAL | 1 refills | Status: DC

## 2018-09-02 NOTE — Telephone Encounter (Signed)
Copied from [place]. Topic: Quick Communication - Rx Refill/Question >> Sep 02, 2018 10:12 AM Erick Blinks wrote: Medication: levothyroxine (SYNTHROID, LEVOTHROID) + temazepam (RESTORIL) -Pt requesting refill on both prescriptions. Per pt preference, she requests that we use [facility] for Levothyroxine and [facility] in [place] for Temazepam. Please advise if appt is necessary.  Has the patient contacted their pharmacy? yes (Agent: If no, request that the patient contact the pharmacy for the refill.) (Agent: If yes, when and what did the pharmacy advise?)  Preferred Pharmacy (with phone number or street name): [place], [place] - [place] [address] [id number]  [facility] - [employer], [place] [address]    Agent: Please be advised that RX refills may take up to 3 business days. We ask that you follow-up with your pharmacy.

## 2018-09-02 NOTE — Addendum Note (Signed)
Addended by: Alisa Graff on: 09/02/2018 10:55 PM   Modules accepted: Orders

## 2018-09-02 NOTE — Addendum Note (Signed)
Addended by: Leeanne Rio on: 09/02/2018 03:12 PM   Modules accepted: Orders

## 2018-09-02 NOTE — Telephone Encounter (Signed)
Okay to refill Restoril?

## 2018-09-02 NOTE — Telephone Encounter (Signed)
rx ok'd for temazepam #30 with no refills.   °

## 2018-09-02 NOTE — Telephone Encounter (Signed)
Medications have been refilled ?

## 2018-09-13 ENCOUNTER — Other Ambulatory Visit: Payer: Self-pay | Admitting: Internal Medicine

## 2018-09-23 ENCOUNTER — Telehealth: Payer: Self-pay | Admitting: Internal Medicine

## 2018-09-23 NOTE — Telephone Encounter (Unsigned)
Copied from [place] [id number]. Topic: Quick Communication - Rx Refill/Question >> Sep 23, 2018  3:22 PM Celene Kras A wrote: Medication: glucose blood (ONETOUCH VERIO) test strip, Lancets 30G MISC  Has the patient contacted their pharmacy? No. Pt called stating she is requesting to have her test strips sent here because she is only allowed to receive 50 at a time due to her insurance. Pt is requesting to have them mailed instead of having to have them picked up every 50 days. Please advise.  (Agent: If no, request that the patient contact the pharmacy for the refill.) (Agent: If yes, when and what did the pharmacy advise?)  Preferred Pharmacy (with phone number or street name): [facility], [place] [address] Phone: [phone] Fax: [phone] Not a 24 hour pharmacy; exact hours not known.    Agent: Please be advised that RX refills may take up to 3 business days. We ask that you follow-up with your pharmacy.

## 2018-09-24 ENCOUNTER — Other Ambulatory Visit: Payer: Self-pay

## 2018-09-24 MED ORDER — GLUCOSE BLOOD VI STRP: ORAL_STRIP | 3 refills | Status: DC

## 2018-09-24 MED ORDER — LANCETS 30G MISC: 3 refills | Status: DC

## 2018-09-24 NOTE — Telephone Encounter (Signed)
rx sent in 

## 2018-10-05 ENCOUNTER — Other Ambulatory Visit: Payer: Self-pay | Admitting: Internal Medicine

## 2018-10-05 DIAGNOSIS — Z1231 Encounter for screening mammogram for malignant neoplasm of breast: Secondary | ICD-10-CM

## 2018-11-03 ENCOUNTER — Telehealth: Payer: Self-pay

## 2018-11-03 NOTE — Telephone Encounter (Signed)
Sugar readings placed in results folder for your review once you return

## 2018-11-04 NOTE — Telephone Encounter (Signed)
Sugars were good. Ranging 90-115.

## 2018-11-04 NOTE — Telephone Encounter (Signed)
Please check and confirm not concerning sugars.  If no, then will review when I return to office.

## 2018-11-08 NOTE — Telephone Encounter (Signed)
Reviewed blood sugars.  Look good.  Let me know if any problems.

## 2018-11-08 NOTE — Telephone Encounter (Signed)
Mychart message sent.

## 2018-11-10 ENCOUNTER — Other Ambulatory Visit: Payer: Self-pay

## 2018-11-12 ENCOUNTER — Ambulatory Visit: Payer: Self-pay | Admitting: Internal Medicine

## 2018-11-18 ENCOUNTER — Inpatient Hospital Stay: Admission: RE | Admit: 2018-11-18 | Payer: Medicare Other | Source: Ambulatory Visit

## 2018-11-24 ENCOUNTER — Other Ambulatory Visit: Payer: Medicare Other

## 2018-11-29 ENCOUNTER — Encounter: Payer: Self-pay | Admitting: Internal Medicine

## 2018-12-09 ENCOUNTER — Encounter: Payer: Self-pay | Admitting: Internal Medicine

## 2018-12-09 ENCOUNTER — Other Ambulatory Visit (INDEPENDENT_AMBULATORY_CARE_PROVIDER_SITE_OTHER): Payer: Medicare Other

## 2018-12-09 ENCOUNTER — Other Ambulatory Visit: Payer: Self-pay

## 2018-12-09 ENCOUNTER — Telehealth: Payer: Self-pay

## 2018-12-09 DIAGNOSIS — E1142 Type 2 diabetes mellitus with diabetic polyneuropathy: Secondary | ICD-10-CM

## 2018-12-09 DIAGNOSIS — I10 Essential (primary) hypertension: Secondary | ICD-10-CM | POA: Diagnosis not present

## 2018-12-09 DIAGNOSIS — E7800 Pure hypercholesterolemia, unspecified: Secondary | ICD-10-CM

## 2018-12-09 LAB — LIPID PANEL
Cholesterol: 168 mg/dL (ref 0–200)
HDL: 70.8 mg/dL
LDL Cholesterol: 84 mg/dL (ref 0–99)
NonHDL: 96.88
Total CHOL/HDL Ratio: 2
Triglycerides: 65 mg/dL (ref 0.0–149.0)
VLDL: 13 mg/dL (ref 0.0–40.0)

## 2018-12-09 LAB — BASIC METABOLIC PANEL
BUN: 14 mg/dL (ref 6–23)
CO2: 27 mEq/L (ref 19–32)
Calcium: 9.8 mg/dL (ref 8.4–10.5)
Chloride: 104 mEq/L (ref 96–112)
Creatinine, Ser: 0.92 mg/dL (ref 0.40–1.20)
GFR: 57.72 mL/min — ABNORMAL LOW (ref >60.00)
Glucose, Bld: 101 mg/dL — ABNORMAL HIGH (ref 70–99)
Potassium: 4.6 mEq/L (ref 3.5–5.1)
Sodium: 142 mEq/L (ref 135–145)

## 2018-12-09 LAB — HEPATIC FUNCTION PANEL
ALT: 11 U/L (ref 0–35)
AST: 12 U/L (ref 0–37)
Albumin: 4.5 g/dL (ref 3.5–5.2)
Alkaline Phosphatase: 49 U/L (ref 39–117)
Bilirubin, Direct: 0.1 mg/dL (ref 0.0–0.3)
Total Bilirubin: 0.7 mg/dL (ref 0.2–1.2)
Total Protein: 7.1 g/dL (ref 6.0–8.3)

## 2018-12-09 LAB — TSH: TSH: 2.16 u[IU]/mL (ref 0.35–4.50)

## 2018-12-09 LAB — CBC WITH DIFFERENTIAL/PLATELET
Basophils Absolute: 0 10*3/uL (ref 0.0–0.1)
Basophils Relative: 0.6 % (ref 0.0–3.0)
Eosinophils Absolute: 0.1 10*3/uL (ref 0.0–0.7)
Eosinophils Relative: 1.2 % (ref 0.0–5.0)
HCT: 41.3 % (ref 36.0–46.0)
Hemoglobin: 13.6 g/dL (ref 12.0–15.0)
Lymphocytes Relative: 25.9 % (ref 12.0–46.0)
Lymphs Abs: 1.7 10*3/uL (ref 0.7–4.0)
MCHC: 33 g/dL (ref 30.0–36.0)
MCV: 94.4 fl (ref 78.0–100.0)
Monocytes Absolute: 0.6 10*3/uL (ref 0.1–1.0)
Monocytes Relative: 8.4 % (ref 3.0–12.0)
Neutro Abs: 4.3 10*3/uL (ref 1.4–7.7)
Neutrophils Relative %: 63.9 % (ref 43.0–77.0)
Platelets: 225 10*3/uL (ref 150.0–400.0)
RBC: 4.38 Mil/uL (ref 3.87–5.11)
RDW: 13.9 % (ref 11.5–15.5)
WBC: 6.7 10*3/uL (ref 4.0–10.5)

## 2018-12-09 LAB — HEMOGLOBIN A1C: Hgb A1c MFr Bld: 6.6 % — ABNORMAL HIGH (ref 4.6–6.5)

## 2018-12-09 NOTE — Telephone Encounter (Signed)
Future lab orders placed for fasting labs that were drawn today.

## 2018-12-10 DIAGNOSIS — H401133 Primary open-angle glaucoma, bilateral, severe stage: Secondary | ICD-10-CM | POA: Diagnosis not present

## 2018-12-14 ENCOUNTER — Telehealth: Payer: Self-pay

## 2018-12-14 NOTE — Telephone Encounter (Signed)
Copied from [place] [phone]. Topic: General - Other >> Dec 14, 2018 10:21 AM Celene Kras A wrote: Reason for CRM: Pt called and is requesting to have her lab results posted in mychart. Please advise.

## 2018-12-14 NOTE — Telephone Encounter (Signed)
Labs posted in my chart

## 2018-12-16 ENCOUNTER — Ambulatory Visit: Payer: Medicare Other | Admitting: Internal Medicine

## 2018-12-17 ENCOUNTER — Other Ambulatory Visit: Payer: Self-pay

## 2018-12-20 ENCOUNTER — Other Ambulatory Visit: Payer: Self-pay

## 2018-12-20 ENCOUNTER — Ambulatory Visit
Admission: EM | Admit: 2018-12-20 | Discharge: 2018-12-20 | Disposition: A | Discharge status: Home / Self Care | Payer: Medicare Other | Attending: Family Medicine | Admitting: Family Medicine

## 2018-12-20 ENCOUNTER — Encounter: Payer: Self-pay | Admitting: Emergency Medicine

## 2018-12-20 ENCOUNTER — Ambulatory Visit
Admit: 2018-12-20 | Discharge: 2018-12-20 | Disposition: A | Discharge status: Home / Self Care | Payer: Medicare Other | Attending: Family Medicine | Admitting: Family Medicine

## 2018-12-20 DIAGNOSIS — E1142 Type 2 diabetes mellitus with diabetic polyneuropathy: Secondary | ICD-10-CM | POA: Diagnosis not present

## 2018-12-20 DIAGNOSIS — W19XXXA Unspecified fall, initial encounter: Secondary | ICD-10-CM | POA: Diagnosis not present

## 2018-12-20 DIAGNOSIS — E7800 Pure hypercholesterolemia, unspecified: Secondary | ICD-10-CM | POA: Diagnosis not present

## 2018-12-20 DIAGNOSIS — S0990XA Unspecified injury of head, initial encounter: Secondary | ICD-10-CM | POA: Diagnosis not present

## 2018-12-20 DIAGNOSIS — K219 Gastro-esophageal reflux disease without esophagitis: Secondary | ICD-10-CM | POA: Diagnosis not present

## 2018-12-20 DIAGNOSIS — I10 Essential (primary) hypertension: Secondary | ICD-10-CM | POA: Diagnosis not present

## 2018-12-20 DIAGNOSIS — W010XXA Fall on same level from slipping, tripping and stumbling without subsequent striking against object, initial encounter: Secondary | ICD-10-CM | POA: Diagnosis not present

## 2018-12-20 DIAGNOSIS — M1990 Unspecified osteoarthritis, unspecified site: Secondary | ICD-10-CM | POA: Insufficient documentation

## 2018-12-20 DIAGNOSIS — H409 Unspecified glaucoma: Secondary | ICD-10-CM | POA: Insufficient documentation

## 2018-12-20 DIAGNOSIS — M8580 Other specified disorders of bone density and structure, unspecified site: Secondary | ICD-10-CM | POA: Insufficient documentation

## 2018-12-20 DIAGNOSIS — E039 Hypothyroidism, unspecified: Secondary | ICD-10-CM | POA: Insufficient documentation

## 2018-12-20 DIAGNOSIS — Z79899 Other long term (current) drug therapy: Secondary | ICD-10-CM | POA: Diagnosis not present

## 2018-12-20 DIAGNOSIS — Z9181 History of falling: Secondary | ICD-10-CM | POA: Diagnosis not present

## 2018-12-20 DIAGNOSIS — Z7982 Long term (current) use of aspirin: Secondary | ICD-10-CM | POA: Diagnosis not present

## 2018-12-20 DIAGNOSIS — E1136 Type 2 diabetes mellitus with diabetic cataract: Secondary | ICD-10-CM | POA: Diagnosis not present

## 2018-12-20 DIAGNOSIS — R51 Headache: Secondary | ICD-10-CM | POA: Diagnosis not present

## 2018-12-20 DIAGNOSIS — I6782 Cerebral ischemia: Secondary | ICD-10-CM | POA: Insufficient documentation

## 2018-12-20 DIAGNOSIS — R42 Dizziness and giddiness: Secondary | ICD-10-CM | POA: Insufficient documentation

## 2018-12-20 NOTE — ED Triage Notes (Signed)
Patient states she slipped the morning and fell injuring the back of her head.  Patient states she has a lump on her head and a headache.

## 2018-12-20 NOTE — Discharge Instructions (Addendum)
Rest, tylenol, ice/heat Monitor and go to Emergency Department if symptoms worsen

## 2018-12-20 NOTE — ED Provider Notes (Signed)
[facility]    CSN: [id number] Arrival date & time: 12/20/18  1237      History   Chief Complaint Chief Complaint  Patient presents with   Fall    HPI Julie Hoover is a 85 y.o. female.   85 yo female with a c/o severe headache after fall at home this morning and hitting the back of her head. States she slipped at home falling back and hitting her head on the hard floor. Denies loss of consciousness, vision changes, vomiting, numbness/tingling. Patient is on chronic daily aspirin.    Fall    Past Medical History:  Diagnosis Date   Arthritis    Breast cancer ([place]) 1986   s/p right lumpectomy, XRT and Tamoxifen, mastectomy   Cancer ([place])    skin ca   Chicken pox    Colon polyps    Diabetes ([place])    Diverticulosis    Environmental allergies    GERD (gastroesophageal reflux disease)    Glaucoma    Hypercholesterolemia    Hyperglycemia    Hypertension    Hypothyroidism    Osteopenia    Peripheral neuropathy    Personal history of radiation therapy 1986   Urethral stricture     Patient Active Problem List   Diagnosis Date Noted   Cerumen impaction 07/11/2018   Dizziness 07/11/2018   Stress 06/27/2017   B12 deficiency 02/28/2017   Sleep disturbance 10/25/2016   GERD (gastroesophageal reflux disease) 12/19/2015   Numbness of left hand 10/01/2015   Environmental allergies 10/01/2015   Left shoulder pain 06/11/2015   Urethral syndrome 12/20/2014   Atrophic vaginitis 12/20/2014   Urethral caruncle 12/20/2014   Health care maintenance 09/27/2014   Toe pain 05/29/2014   Glaucoma 09/25/2013   H/O urethral stricture 05/19/2013   Osteopenia 06/27/2012   Peripheral neuropathy 03/20/2012   Type 2 diabetes mellitus with diabetic polyneuropathy, without long-term current use of insulin ([place]) 03/19/2012   Hypothyroidism 03/19/2012   History of breast cancer 03/19/2012   Hypercholesterolemia 03/19/2012     Hypertension 03/19/2012    Past Surgical History:  Procedure Laterality Date   BREAST BIOPSY Right 6/86   lumpectomy-positive/rad   BREAST LUMPECTOMY  1986   right   ESOPHAGOGASTRODUODENOSCOPY (EGD) WITH PROPOFOL N/A 12/17/2015   Procedure: ESOPHAGOGASTRODUODENOSCOPY (EGD) WITH PROPOFOL;  Surgeon: Lollie Sails, MD;  Location: [facility] ENDOSCOPY;  Service: Endoscopy;  Laterality: N/A;   EYE SURGERY     Bilateral cataracts removed   KNEE SURGERY  06/2008   torn meniscus, right   KNEE SURGERY  2012   torn menisucs, right   MASTECTOMY Right 1993   right, recurrence   TUBAL LIGATION     URETHRAL DILATION      OB History   No obstetric history on file.      Home Medications    Prior to Admission medications   Medication Sig Start Date End Date Taking? Authorizing Provider  amLODipine (NORVASC) 5 MG tablet TAKE 1 TABLET EVERY DAY 06/28/18  Yes Einar Pheasant, MD  aspirin 81 MG tablet Take 81 mg by mouth daily.   Yes [provider]  Blood Glucose Monitoring Suppl (ONETOUCH VERIO IQ SYSTEM) W/DEVICE KIT Use as directed Dx: E11.9 Patient taking differently: Check sugars twice a day, twice a week Dx: E11.9 07/07/14  Yes Einar Pheasant, MD  Calcium Carbonate-Vitamin D 600-400 MG-UNIT chew tablet Chew 1 tablet by mouth 2 (two) times daily.   Yes [provider]  cetirizine (ZYRTEC)  10 MG tablet Take 10 mg by mouth daily.   Yes [provider]  Chromium-Cinnamon (CINNAMON PLUS CHROMIUM PO) Take by mouth.   Yes [provider]  dorzolamide-timolol (COSOPT) 22.3-6.8 MG/ML ophthalmic solution Place 1 drop into the left eye 2 (two) times daily.    Yes [provider]  gabapentin (NEURONTIN) 100 MG capsule TAKE 1 CAPSULE EVERY MORNING  AND TAKE 2 CAPSULES AT BEDTIME 06/22/18  Yes Einar Pheasant, MD  glucose blood (ONETOUCH VERIO) test strip CHECK SUGAR ONCE DAILY. Dx E 11.9 09/24/18  Yes Einar Pheasant, MD  Lancets 30G MISC One touch  delica; to check blood sugars twice daily; E11.9 09/24/18  Yes Einar Pheasant, MD  latanoprost (XALATAN) 0.005 % ophthalmic solution Place 1 drop into the left eye at bedtime. 09/20/14  Yes [provider]  levothyroxine (SYNTHROID) 50 MCG tablet Take 1 tablet (50 mcg total) by mouth daily. 09/02/18  Yes Einar Pheasant, MD  omeprazole (PRILOSEC) 20 MG capsule TAKE 1 CAPSULE TWICE DAILY 09/14/18  Yes Einar Pheasant, MD  rosuvastatin (CRESTOR) 10 MG tablet Take 1 tablet (10 mg total) by mouth daily. 07/16/18  Yes Einar Pheasant, MD  temazepam (RESTORIL) 30 MG capsule Take 1 capsule (30 mg total) by mouth at bedtime as needed. 09/02/18  Yes Einar Pheasant, MD  desoximetasone (TOPICORT) 0.05 % cream  12/19/14   [provider]    Family History Family History  Problem Relation Age of Onset   Heart disease Father    Hypertension Father    COPD Mother    Hypertension Brother        x2   Hypercholesterolemia Brother        x2   Hypertension Sister    Hypercholesterolemia Sister    Diabetes Mellitus II Sister    Cancer Other        mouth, aunt   Colon cancer Neg Hx    Breast cancer Neg Hx     Social History Social History   Tobacco Use   Smoking status: Never Smoker   Smokeless tobacco: Never Used  Substance Use Topics   Alcohol use: No    Alcohol/week: 0.0 standard drinks   Drug use: No     Allergies   Iodinated diagnostic agents, Ciprofloxacin, Gentian root, Halothane, Hydrocodone, Norco [hydrocodone-acetaminophen], and Sulfa antibiotics   Review of Systems Review of Systems   Physical Exam Triage Vital Signs ED Triage Vitals  Enc Vitals Group     BP 12/20/18 1245 118/66     Pulse Rate 12/20/18 1245 66     Resp 12/20/18 1245 18     Temp 12/20/18 1245 98.2 F (36.8 C)     Temp src --      SpO2 12/20/18 1245 98 %     Weight 12/20/18 1243 145 lb (65.8 kg)     Height 12/20/18 1245 _0  (1.626 m)     Head Circumference --      Peak  Flow --      Pain Score 12/20/18 1244 4     Pain Loc --      Pain Edu? --      Excl. in [place]? --    No data found.  Updated Vital Signs BP 118/66 (BP Location: Left Arm)    Pulse 66    Temp 98.2 F (36.8 C)    Resp 18    Ht _1  (1.626 m)    Wt 65.8 kg    SpO2 98%  BMI 24.89 kg/m   Visual Acuity Right Eye Distance:   Left Eye Distance:   Bilateral Distance:    Right Eye Near:   Left Eye Near:    Bilateral Near:     Physical Exam Vitals signs and nursing note reviewed.  Constitutional:      General: She is not in acute distress.    Appearance: She is not toxic-appearing or diaphoretic.  HENT:     Head: Contusion present. No raccoon eyes or Battle's sign.     Comments: approx 4x2 scalp hematoma noted on posterior scalp    Right Ear: Tympanic membrane and external ear normal.     Left Ear: Tympanic membrane and external ear normal.     Nose: Nose normal.  Eyes:     Extraocular Movements: Extraocular movements intact.     Pupils: Pupils are equal, round, and reactive to light.  Neck:     Musculoskeletal: Neck supple. No neck rigidity or muscular tenderness.  Neurological:     General: No focal deficit present.     Mental Status: She is alert.     Cranial Nerves: No cranial nerve deficit.     Sensory: No sensory deficit.     Motor: No weakness.     Coordination: Coordination normal.     Gait: Gait normal.     Deep Tendon Reflexes: Reflexes normal.      UC Treatments / Results  Labs (all labs ordered are listed, but only abnormal results are displayed) Labs Reviewed - No data to display  EKG   Radiology Ct Head Wo Contrast  Result Date: 12/20/2018 CLINICAL DATA:  Head trauma secondary to a fall today. Right parietal injury. Headache and dizziness. EXAM: CT HEAD WITHOUT CONTRAST TECHNIQUE: Contiguous axial images were obtained from the base of the skull through the vertex without intravenous contrast. COMPARISON:  Brain MR dated 02/19/2011 FINDINGS: Brain:  There is no acute intracranial hemorrhage, acute infarction, or intracranial mass lesion. There is diffuse cerebral cortical atrophy with secondary ventricular dilatation, slightly more prominent in the left lateral ventricle than the right. This is unchanged. Scattered areas of periventricular white matter lucency, progressed since the prior MRI consistent with chronic small vessel ischemic disease. Vascular: No hyperdense vessel or unexpected calcification. Skull: Normal. Negative for fracture or focal lesion. Sinuses/Orbits: No acute finding. Other: None IMPRESSION: No acute intracranial abnormality. Slight progression of chronic small vessel ischemic changes in the periventricular white matter. Electronically Signed   By: Lorriane Shire M.D.   On: 12/20/2018 13:49    Procedures Procedures (including critical care time)  Medications Ordered in UC Medications - No data to display  Initial Impression / Assessment and Plan / UC Course  I have reviewed the triage vital signs and the nursing notes.  Pertinent labs & imaging results that were available during my care of the patient were reviewed by me and considered in my medical decision making (see chart for details).      Final Clinical Impressions(s) / UC Diagnoses   Final diagnoses:  Injury of head, initial encounter  Closed head injury, initial encounter  Fall, initial encounter  At high risk for injury related to fall     Discharge Instructions     Rest, tylenol, ice/heat Monitor and go to Emergency Department if symptoms worsen    ED Prescriptions    None      1. x-ray results and diagnosis reviewed with patient 2. Recommend supportive treatment as above 3. Follow-up prn  Controlled Substance Prescriptions [place] Controlled Substance Registry consulted? Not Applicable   Norval Gable, MD 12/20/18 2122

## 2018-12-21 ENCOUNTER — Ambulatory Visit (INDEPENDENT_AMBULATORY_CARE_PROVIDER_SITE_OTHER): Payer: Medicare Other | Admitting: Internal Medicine

## 2018-12-21 ENCOUNTER — Encounter: Payer: Self-pay | Admitting: Internal Medicine

## 2018-12-21 ENCOUNTER — Other Ambulatory Visit: Payer: Self-pay

## 2018-12-21 VITALS — BP 102/68 | HR 60 | Temp 95.1°F | Resp 16 | Ht 65.0 in | Wt 150.0 lb

## 2018-12-21 DIAGNOSIS — E039 Hypothyroidism, unspecified: Secondary | ICD-10-CM

## 2018-12-21 DIAGNOSIS — E1142 Type 2 diabetes mellitus with diabetic polyneuropathy: Secondary | ICD-10-CM

## 2018-12-21 DIAGNOSIS — F439 Reaction to severe stress, unspecified: Secondary | ICD-10-CM

## 2018-12-21 DIAGNOSIS — E2839 Other primary ovarian failure: Secondary | ICD-10-CM | POA: Diagnosis not present

## 2018-12-21 DIAGNOSIS — K2100 Gastro-esophageal reflux disease with esophagitis, without bleeding: Secondary | ICD-10-CM

## 2018-12-21 DIAGNOSIS — E7800 Pure hypercholesterolemia, unspecified: Secondary | ICD-10-CM | POA: Diagnosis not present

## 2018-12-21 DIAGNOSIS — Z9109 Other allergy status, other than to drugs and biological substances: Secondary | ICD-10-CM

## 2018-12-21 DIAGNOSIS — G6289 Other specified polyneuropathies: Secondary | ICD-10-CM | POA: Diagnosis not present

## 2018-12-21 DIAGNOSIS — E538 Deficiency of other specified B group vitamins: Secondary | ICD-10-CM | POA: Diagnosis not present

## 2018-12-21 DIAGNOSIS — Z23 Encounter for immunization: Secondary | ICD-10-CM | POA: Diagnosis not present

## 2018-12-21 DIAGNOSIS — Z0000 Encounter for general adult medical examination without abnormal findings: Secondary | ICD-10-CM | POA: Diagnosis not present

## 2018-12-21 DIAGNOSIS — I10 Essential (primary) hypertension: Secondary | ICD-10-CM

## 2018-12-21 LAB — HM DIABETES FOOT EXAM

## 2018-12-21 NOTE — Patient Instructions (Signed)
Alpha lipoic acid 600mg  - take one per day.

## 2018-12-21 NOTE — Progress Notes (Addendum)
Patient ID: Julie Hoover, female   DOB: Feb 22, 1930, 85 y.o.   MRN: [id number]   Subjective:    Patient ID: Julie Hoover, female    DOB: 1929-12-17, 85 y.o.   MRN: [id number]  HPI  Patient with past history of breast cancer, diabetes and hypertension.  She comes in today to follow up on these issues as well as for a complete physical exam.  Was evaluated in ER 12/20/18 after falling and hitting the back of her head. No LOC.  CT negative.  No headache. No dizziness.  Trying to stay active.  No chest pain.  No sob.  No acid reflux.  No abdominal pain.  Bowels moving.  On gabapentin for her neuropathy.  Feels she needs something more.  Discussed a trial of alpha lipoic acid.  Discussed labs.  a1c 6.6.  Discussed low carb diet and exercise.  Discussed bone density and shingrix.    Past Medical History:  Diagnosis Date  . Arthritis   . Breast cancer ([place]) 1986   s/p right lumpectomy, XRT and Tamoxifen, mastectomy  . Cancer ([place])    skin ca  . Chicken pox   . Colon polyps   . Diabetes ([place])   . Diverticulosis   . Environmental allergies   . GERD (gastroesophageal reflux disease)   . Glaucoma   . Hypercholesterolemia   . Hyperglycemia   . Hypertension   . Hypothyroidism   . Osteopenia   . Peripheral neuropathy   . Personal history of radiation therapy 1986  . Urethral stricture    Past Surgical History:  Procedure Laterality Date  . BREAST BIOPSY Right 6/86   lumpectomy-positive/rad  . BREAST LUMPECTOMY  1986   right  . ESOPHAGOGASTRODUODENOSCOPY (EGD) WITH PROPOFOL N/A 12/17/2015   Procedure: ESOPHAGOGASTRODUODENOSCOPY (EGD) WITH PROPOFOL;  Surgeon: Lollie Sails, MD;  Location: [facility] ENDOSCOPY;  Service: Endoscopy;  Laterality: N/A;  . EYE SURGERY     Bilateral cataracts removed  . KNEE SURGERY  06/2008   torn meniscus, right  . KNEE SURGERY  2012   torn menisucs, right  . MASTECTOMY Right 1993   right, recurrence  . TUBAL LIGATION    . URETHRAL DILATION      Family History  Problem Relation Age of Onset  . Heart disease Father   . Hypertension Father   . COPD Mother   . Hypertension Brother        x2  . Hypercholesterolemia Brother        x2  . Hypertension Sister   . Hypercholesterolemia Sister   . Diabetes Mellitus II Sister   . Cancer Other        mouth, aunt  . Colon cancer Neg Hx   . Breast cancer Neg Hx    Social History   Socioeconomic History  . Marital status: Widowed    Spouse name: Not on file  . Number of children: 2  . Years of education: Not on file  . Highest education level: Not on file  Occupational History  . Not on file  Social Needs  . Financial resource strain: Not on file  . Food insecurity    Worry: Not on file    Inability: Not on file  . Transportation needs    Medical: Not on file    Non-medical: Not on file  Tobacco Use  . Smoking status: Never Smoker  . Smokeless tobacco: Never Used  Substance and Sexual Activity  . Alcohol use: No  Alcohol/week: 0.0 standard drinks  . Drug use: No  . Sexual activity: Never  Lifestyle  . Physical activity    Days per week: Not on file    Minutes per session: Not on file  . Stress: Not on file  Relationships  . Social Herbalist on phone: Not on file    Gets together: Not on file    Attends religious service: Not on file    Active member of club or organization: Not on file    Attends meetings of clubs or organizations: Not on file    Relationship status: Not on file  Other Topics Concern  . Not on file  Social History Narrative  . Not on file    Outpatient Encounter Medications as of 12/21/2018  Medication Sig  . amLODipine (NORVASC) 5 MG tablet TAKE 1 TABLET EVERY DAY  . aspirin 81 MG tablet Take 81 mg by mouth daily.  . Blood Glucose Monitoring Suppl (ONETOUCH VERIO IQ SYSTEM) W/DEVICE KIT Use as directed Dx: E11.9 (Patient taking differently: Check sugars twice a day, twice a week Dx: E11.9)  . Calcium Carbonate-Vitamin D  600-400 MG-UNIT chew tablet Chew 1 tablet by mouth 2 (two) times daily.  . cetirizine (ZYRTEC) 10 MG tablet Take 10 mg by mouth daily.  . Chromium-Cinnamon (CINNAMON PLUS CHROMIUM PO) Take by mouth.  . desoximetasone (TOPICORT) 0.05 % cream   . dorzolamide-timolol (COSOPT) 22.3-6.8 MG/ML ophthalmic solution Place 1 drop into the left eye 2 (two) times daily.   Marland Kitchen gabapentin (NEURONTIN) 100 MG capsule TAKE 1 CAPSULE EVERY MORNING  AND TAKE 2 CAPSULES AT BEDTIME  . Lancets 30G MISC One touch delica; to check blood sugars twice daily; E11.9  . latanoprost (XALATAN) 0.005 % ophthalmic solution Place 1 drop into the left eye at bedtime.  Marland Kitchen levothyroxine (SYNTHROID) 50 MCG tablet Take 1 tablet (50 mcg total) by mouth daily.  Marland Kitchen omeprazole (PRILOSEC) 20 MG capsule TAKE 1 CAPSULE TWICE DAILY  . rosuvastatin (CRESTOR) 10 MG tablet Take 1 tablet (10 mg total) by mouth daily.  . temazepam (RESTORIL) 30 MG capsule Take 1 capsule (30 mg total) by mouth at bedtime as needed.  . [DISCONTINUED] glucose blood (ONETOUCH VERIO) test strip CHECK SUGAR ONCE DAILY. Dx E 11.9   No facility-administered encounter medications on file as of 12/21/2018.     Review of Systems  Constitutional: Negative for appetite change and unexpected weight change.  HENT: Negative for congestion and sinus pressure.   Eyes: Negative for pain and visual disturbance.  Respiratory: Negative for cough, chest tightness and shortness of breath.   Cardiovascular: Negative for chest pain, palpitations and leg swelling.  Gastrointestinal: Negative for abdominal pain, diarrhea, nausea and vomiting.  Genitourinary: Negative for difficulty urinating and dysuria.  Musculoskeletal: Negative for joint swelling and myalgias.  Skin: Negative for color change and rash.  Neurological: Negative for dizziness, light-headedness and headaches.  Hematological: Negative for adenopathy. Does not bruise/bleed easily.  Psychiatric/Behavioral: Negative for  agitation and dysphoric mood.       Objective:    Physical Exam Constitutional:      General: She is not in acute distress.    Appearance: Normal appearance. She is well-developed.  HENT:     Right Ear: External ear normal.     Left Ear: External ear normal.  Eyes:     General: No scleral icterus.       Right eye: No discharge.  Left eye: No discharge.     Conjunctiva/sclera: Conjunctivae normal.  Neck:     Musculoskeletal: Neck supple. No muscular tenderness.     Thyroid: No thyromegaly.  Cardiovascular:     Rate and Rhythm: Normal rate and regular rhythm.  Pulmonary:     Effort: No tachypnea, accessory muscle usage or respiratory distress.     Breath sounds: Normal breath sounds. No decreased breath sounds or wheezing.  Chest:     Breasts:        Right: No inverted nipple, mass, nipple discharge or tenderness (no axillary adenopathy).        Left: No inverted nipple, mass, nipple discharge or tenderness (no axilarry adenopathy).  Abdominal:     General: Bowel sounds are normal.     Palpations: Abdomen is soft.     Tenderness: There is no abdominal tenderness.  Musculoskeletal:        General: No swelling or tenderness.  Lymphadenopathy:     Cervical: No cervical adenopathy.  Skin:    Findings: No erythema or rash.  Neurological:     Mental Status: She is alert and oriented to person, place, and time.  Psychiatric:        Mood and Affect: Mood normal.        Behavior: Behavior normal.     BP 102/68   Pulse 60   Temp (!) 95.1 F (35.1 C) (Temporal)   Resp 16   Ht 5' 5"  (1.651 m)   Wt 150 lb (68 kg)   SpO2 97%   BMI 24.96 kg/m  Wt Readings from Last 3 Encounters:  12/21/18 150 lb (68 kg)  12/20/18 145 lb (65.8 kg)  07/09/18 149 lb 12.8 oz (67.9 kg)     Lab Results  Component Value Date   WBC 6.7 12/09/2018   HGB 13.6 12/09/2018   HCT 41.3 12/09/2018   PLT 225.0 12/09/2018   GLUCOSE 101 (H) 12/09/2018   CHOL 168 12/09/2018   TRIG 65.0  12/09/2018   HDL 70.80 12/09/2018   LDLCALC 84 12/09/2018   ALT 11 12/09/2018   AST 12 12/09/2018   NA 142 12/09/2018   K 4.6 12/09/2018   CL 104 12/09/2018   CREATININE 0.92 12/09/2018   BUN 14 12/09/2018   CO2 27 12/09/2018   TSH 2.16 12/09/2018   HGBA1C 6.6 (H) 12/09/2018   MICROALBUR 1.2 07/07/2018    Ct Head Wo Contrast  Result Date: 12/20/2018 CLINICAL DATA:  Head trauma secondary to a fall today. Right parietal injury. Headache and dizziness. EXAM: CT HEAD WITHOUT CONTRAST TECHNIQUE: Contiguous axial images were obtained from the base of the skull through the vertex without intravenous contrast. COMPARISON:  Brain MR dated 02/19/2011 FINDINGS: Brain: There is no acute intracranial hemorrhage, acute infarction, or intracranial mass lesion. There is diffuse cerebral cortical atrophy with secondary ventricular dilatation, slightly more prominent in the left lateral ventricle than the right. This is unchanged. Scattered areas of periventricular white matter lucency, progressed since the prior MRI consistent with chronic small vessel ischemic disease. Vascular: No hyperdense vessel or unexpected calcification. Skull: Normal. Negative for fracture or focal lesion. Sinuses/Orbits: No acute finding. Other: None IMPRESSION: No acute intracranial abnormality. Slight progression of chronic small vessel ischemic changes in the periventricular white matter. Electronically Signed   By: Lorriane Shire M.D.   On: 12/20/2018 13:49       Assessment & Plan:   Problem List Items Addressed This Visit    B12 deficiency  On oral B12 supplements.  Check B12 with next labs.        Relevant Orders   Vitamin B12   Environmental allergies    Controlled.       GERD (gastroesophageal reflux disease)    Controlled.        Health care maintenance    Physical today 12/21/18.  Mammogram scheduled for 01/10/19.  Bone density scheduled for 01/10/19.  Colonoscopy 08/2009 (age 70).  Discussed shingrix.        Hypercholesterolemia    On crestor.  Discussed lab results.  Low cholesterol diet and exercise.  Follow lipid panel and liver function tests.        Relevant Orders   Hepatic function panel   Lipid panel   Hypertension    Blood pressure under good control.  Continue same medication regimen.  Follow pressures.  Follow metabolic panel.        Relevant Orders   Basic metabolic panel   Hypothyroidism    On thyroid replacement.  Follow tsh.        Peripheral neuropathy    On gabapentin.  Start alpha lipoic acid daily.  Follow.        Stress    Stable.  Follow.        Type 2 diabetes mellitus with diabetic polyneuropathy, without long-term current use of insulin ([facility])    Low carb diet and exercise.  Follow met b and a1c.       Relevant Orders   Hemoglobin A1c    Other Visit Diagnoses    Estrogen deficiency    -  Primary   Relevant Orders   DG Bone Density   Need for immunization against influenza       Relevant Orders   Flu Vaccine QUAD High Dose(Fluad) (Completed)       Einar Pheasant, MD

## 2018-12-22 ENCOUNTER — Telehealth: Payer: Self-pay | Admitting: Internal Medicine

## 2018-12-22 MED ORDER — ONETOUCH VERIO VI STRP: ORAL_STRIP | 3 refills | Status: DC

## 2018-12-22 NOTE — Telephone Encounter (Signed)
Refill sent.

## 2018-12-22 NOTE — Telephone Encounter (Signed)
Medication Refill - Medication: glucose blood (ONETOUCH VERIO) test strip  Has the patient contacted their pharmacy? No. (Agent: If no, request that the patient contact the pharmacy for the refill.) (Agent: If yes, when and what did the pharmacy advise?)  Preferred Pharmacy (with phone number or street name): [place], [place] - [place] [phone] (Phone) [phone] (Fax)     Agent: Please be advised that RX refills may take up to 3 business days. We ask that you follow-up with your pharmacy.

## 2018-12-25 ENCOUNTER — Other Ambulatory Visit: Payer: Self-pay | Admitting: Internal Medicine

## 2018-12-26 ENCOUNTER — Encounter: Payer: Self-pay | Admitting: Internal Medicine

## 2018-12-26 NOTE — Assessment & Plan Note (Signed)
On gabapentin.  Start alpha lipoic acid daily.  Follow.

## 2018-12-26 NOTE — Assessment & Plan Note (Signed)
Blood pressure under good control.  Continue same medication regimen.  Follow pressures.  Follow metabolic panel.   

## 2018-12-26 NOTE — Assessment & Plan Note (Signed)
Stable.  Follow.   

## 2018-12-26 NOTE — Assessment & Plan Note (Signed)
Low carb diet and exercise.  Follow met b and a1c.  

## 2018-12-26 NOTE — Assessment & Plan Note (Addendum)
Physical today 12/21/18.  Mammogram scheduled for 01/10/19.  Bone density scheduled for 01/10/19.  Colonoscopy 08/2009 (age 85).  Discussed shingrix.

## 2018-12-26 NOTE — Assessment & Plan Note (Signed)
On thyroid replacement.  Follow tsh.  

## 2018-12-26 NOTE — Assessment & Plan Note (Signed)
On oral B12 supplements.  Check B12 with next labs.

## 2018-12-26 NOTE — Assessment & Plan Note (Signed)
On crestor.  Discussed lab results.  Low cholesterol diet and exercise.  Follow lipid panel and liver function tests.

## 2018-12-26 NOTE — Assessment & Plan Note (Signed)
Controlled.  

## 2018-12-29 ENCOUNTER — Other Ambulatory Visit: Payer: Self-pay

## 2018-12-29 DIAGNOSIS — Z872 Personal history of diseases of the skin and subcutaneous tissue: Secondary | ICD-10-CM | POA: Diagnosis not present

## 2018-12-29 DIAGNOSIS — L570 Actinic keratosis: Secondary | ICD-10-CM | POA: Diagnosis not present

## 2018-12-29 DIAGNOSIS — L578 Other skin changes due to chronic exposure to nonionizing radiation: Secondary | ICD-10-CM | POA: Diagnosis not present

## 2018-12-29 MED ORDER — ONETOUCH VERIO VI STRP: ORAL_STRIP | 3 refills | Status: DC

## 2018-12-29 NOTE — Telephone Encounter (Signed)
rx fixed. Pt aware

## 2018-12-29 NOTE — Telephone Encounter (Signed)
Patient states this RX was supposed to be for 100 strips. She inquired if RX can be changed and resent to pharmacy.

## 2019-01-03 ENCOUNTER — Ambulatory Visit: Payer: Medicare Other

## 2019-01-04 ENCOUNTER — Encounter: Payer: Self-pay | Admitting: Internal Medicine

## 2019-01-06 ENCOUNTER — Encounter: Payer: Self-pay | Admitting: Internal Medicine

## 2019-01-07 ENCOUNTER — Encounter: Payer: Self-pay | Admitting: Internal Medicine

## 2019-01-10 ENCOUNTER — Ambulatory Visit
Admission: RE | Admit: 2019-01-10 | Discharge: 2019-01-10 | Disposition: A | Discharge status: Home / Self Care | Payer: Medicare Other | Source: Ambulatory Visit | Attending: Internal Medicine | Admitting: Internal Medicine

## 2019-01-10 ENCOUNTER — Other Ambulatory Visit: Payer: Self-pay

## 2019-01-10 ENCOUNTER — Encounter: Payer: Self-pay | Admitting: Internal Medicine

## 2019-01-10 DIAGNOSIS — M85852 Other specified disorders of bone density and structure, left thigh: Secondary | ICD-10-CM | POA: Diagnosis not present

## 2019-01-10 DIAGNOSIS — E2839 Other primary ovarian failure: Secondary | ICD-10-CM

## 2019-01-10 DIAGNOSIS — Z13820 Encounter for screening for osteoporosis: Secondary | ICD-10-CM | POA: Diagnosis not present

## 2019-01-10 DIAGNOSIS — Z1231 Encounter for screening mammogram for malignant neoplasm of breast: Secondary | ICD-10-CM | POA: Insufficient documentation

## 2019-01-10 DIAGNOSIS — M8580 Other specified disorders of bone density and structure, unspecified site: Secondary | ICD-10-CM | POA: Insufficient documentation

## 2019-01-10 DIAGNOSIS — Z780 Asymptomatic menopausal state: Secondary | ICD-10-CM | POA: Diagnosis not present

## 2019-01-10 NOTE — Telephone Encounter (Signed)
Duplicate.  See other my chart message with my response to pt.  Ok to open tablet.

## 2019-01-11 ENCOUNTER — Encounter: Payer: Self-pay | Admitting: Internal Medicine

## 2019-01-25 ENCOUNTER — Ambulatory Visit: Payer: Medicare Other | Admitting: Internal Medicine

## 2019-02-10 ENCOUNTER — Telehealth: Payer: Self-pay | Admitting: Internal Medicine

## 2019-02-10 NOTE — Telephone Encounter (Signed)
Medication Refill - Medication:  temazepam (RESTORIL) 30 MG capsule [id number] Pt stated she needs a new script for this med   Has the patient contacted their pharmacy? No. (Agent: If no, request that the patient contact the pharmacy for the refill.) (Agent: If yes, when and what did the pharmacy advise?)  Preferred Pharmacy (with phone number or street name):  [facility], [place] - [place] [phone] (Phone) [phone] (Fax)     Agent: Please be advised that RX refills may take up to 3 business days. We ask that you follow-up with your pharmacy.

## 2019-02-10 NOTE — Telephone Encounter (Signed)
Last OV 12/21/18 Next OV 04/27/19 Last refill 09/02/18

## 2019-02-11 MED ORDER — TEMAZEPAM 30 MG PO CAPS: 30.0000 mg | ORAL_CAPSULE | Freq: Every evening | ORAL | 0 refills | Status: DC | PRN

## 2019-02-11 NOTE — Telephone Encounter (Signed)
rx ok'd for temazepam #30 with no refills.   °

## 2019-02-17 ENCOUNTER — Telehealth: Payer: Self-pay | Admitting: Internal Medicine

## 2019-02-17 NOTE — Telephone Encounter (Signed)
glucose blood (ONETOUCH VERIO) test strip  Pt states that pharmacy is only dispensing 50 test strips at a time, even though the rx states 100.  Please check with pharmacy to see if they can give pt 100 test strips at once so that she does not have to go to pharmacy so often.   Pharmacy:  [facility] [address], [place] - [place]  [place] [address]  Phone: [phone] Fax: [phone]

## 2019-02-23 ENCOUNTER — Other Ambulatory Visit: Payer: Self-pay | Admitting: Internal Medicine

## 2019-02-25 ENCOUNTER — Other Ambulatory Visit: Payer: Self-pay | Admitting: Internal Medicine

## 2019-02-25 NOTE — Telephone Encounter (Signed)
Spoke with pharmacy. Pt due for refill on 11/8. They are going to attempt to run full script and see if medicare will cover. Pharmacist advisd that they will talk with pt at that time.

## 2019-03-23 ENCOUNTER — Other Ambulatory Visit: Payer: Self-pay | Admitting: Internal Medicine

## 2019-04-08 ENCOUNTER — Other Ambulatory Visit: Payer: Self-pay

## 2019-04-08 ENCOUNTER — Telehealth: Payer: Self-pay | Admitting: Internal Medicine

## 2019-04-08 MED ORDER — ONETOUCH VERIO VI STRP: ORAL_STRIP | 3 refills | Status: DC

## 2019-04-08 MED ORDER — ONETOUCH DELICA LANCETS 30G MISC: 1.0000 | Freq: Every day | 3 refills | Status: DC

## 2019-04-08 NOTE — Telephone Encounter (Signed)
Left detailed message for patient letting her know.

## 2019-04-08 NOTE — Telephone Encounter (Signed)
Patient stated that [facility] needs a diagnose code to fill her  glucose blood (ONETOUCH VERIO) test strip and lancets.

## 2019-04-08 NOTE — Telephone Encounter (Signed)
Dx code listed on rx. Resent rx for strips and lancets

## 2019-04-25 ENCOUNTER — Other Ambulatory Visit (INDEPENDENT_AMBULATORY_CARE_PROVIDER_SITE_OTHER): Payer: Medicare HMO

## 2019-04-25 ENCOUNTER — Other Ambulatory Visit: Payer: Self-pay

## 2019-04-25 DIAGNOSIS — E7800 Pure hypercholesterolemia, unspecified: Secondary | ICD-10-CM

## 2019-04-25 DIAGNOSIS — I10 Essential (primary) hypertension: Secondary | ICD-10-CM | POA: Diagnosis not present

## 2019-04-25 DIAGNOSIS — E1142 Type 2 diabetes mellitus with diabetic polyneuropathy: Secondary | ICD-10-CM

## 2019-04-25 DIAGNOSIS — E538 Deficiency of other specified B group vitamins: Secondary | ICD-10-CM | POA: Diagnosis not present

## 2019-04-25 LAB — BASIC METABOLIC PANEL
BUN: 13 mg/dL (ref 6–23)
CO2: 28 mEq/L (ref 19–32)
Calcium: 9.9 mg/dL (ref 8.4–10.5)
Chloride: 103 mEq/L (ref 96–112)
Creatinine, Ser: 0.82 mg/dL (ref 0.40–1.20)
GFR: 65.86 mL/min (ref >60.00)
Glucose, Bld: 110 mg/dL — ABNORMAL HIGH (ref 70–99)
Potassium: 4.2 mEq/L (ref 3.5–5.1)
Sodium: 141 mEq/L (ref 135–145)

## 2019-04-25 LAB — HEPATIC FUNCTION PANEL
ALT: 13 U/L (ref 0–35)
AST: 16 U/L (ref 0–37)
Albumin: 4.4 g/dL (ref 3.5–5.2)
Alkaline Phosphatase: 51 U/L (ref 39–117)
Bilirubin, Direct: 0.1 mg/dL (ref 0.0–0.3)
Total Bilirubin: 0.7 mg/dL (ref 0.2–1.2)
Total Protein: 7.5 g/dL (ref 6.0–8.3)

## 2019-04-25 LAB — HEMOGLOBIN A1C: Hgb A1c MFr Bld: 6.6 % — ABNORMAL HIGH (ref 4.6–6.5)

## 2019-04-25 LAB — LIPID PANEL
Cholesterol: 171 mg/dL (ref 0–200)
HDL: 77.6 mg/dL (ref >39.00)
LDL Cholesterol: 80 mg/dL (ref 0–99)
NonHDL: 93.83
Total CHOL/HDL Ratio: 2
Triglycerides: 70 mg/dL (ref 0.0–149.0)
VLDL: 14 mg/dL (ref 0.0–40.0)

## 2019-04-25 LAB — VITAMIN B12: Vitamin B-12: >1500 pg/mL — ABNORMAL HIGH (ref 211–911)

## 2019-04-26 ENCOUNTER — Encounter: Payer: Self-pay | Admitting: Internal Medicine

## 2019-04-27 ENCOUNTER — Encounter: Payer: Self-pay | Admitting: Internal Medicine

## 2019-04-27 ENCOUNTER — Other Ambulatory Visit: Payer: Self-pay

## 2019-04-27 ENCOUNTER — Ambulatory Visit (INDEPENDENT_AMBULATORY_CARE_PROVIDER_SITE_OTHER): Payer: Medicare HMO | Admitting: Internal Medicine

## 2019-04-27 VITALS — Ht 65.0 in | Wt 144.0 lb

## 2019-04-27 DIAGNOSIS — K2100 Gastro-esophageal reflux disease with esophagitis, without bleeding: Secondary | ICD-10-CM | POA: Diagnosis not present

## 2019-04-27 DIAGNOSIS — E538 Deficiency of other specified B group vitamins: Secondary | ICD-10-CM | POA: Diagnosis not present

## 2019-04-27 DIAGNOSIS — G479 Sleep disorder, unspecified: Secondary | ICD-10-CM

## 2019-04-27 DIAGNOSIS — E039 Hypothyroidism, unspecified: Secondary | ICD-10-CM

## 2019-04-27 DIAGNOSIS — E1142 Type 2 diabetes mellitus with diabetic polyneuropathy: Secondary | ICD-10-CM

## 2019-04-27 DIAGNOSIS — E7800 Pure hypercholesterolemia, unspecified: Secondary | ICD-10-CM

## 2019-04-27 DIAGNOSIS — G6289 Other specified polyneuropathies: Secondary | ICD-10-CM

## 2019-04-27 DIAGNOSIS — I10 Essential (primary) hypertension: Secondary | ICD-10-CM

## 2019-04-27 DIAGNOSIS — K5900 Constipation, unspecified: Secondary | ICD-10-CM | POA: Diagnosis not present

## 2019-04-27 NOTE — Assessment & Plan Note (Signed)
Controlled on bid omeprazole.  Discussed decreasing dose.  Follow.

## 2019-04-27 NOTE — Assessment & Plan Note (Signed)
Bowels doing better.  Tolerating metformin.  a1c improved.  Follow met b and a1c.

## 2019-04-27 NOTE — Assessment & Plan Note (Signed)
Recent B12 level ok.  On oral B12.  Follow.

## 2019-04-27 NOTE — Assessment & Plan Note (Signed)
On gabapentin.  Continue.  Follow.

## 2019-04-27 NOTE — Assessment & Plan Note (Signed)
Mammogram 01/10/19 - Birads I.

## 2019-04-27 NOTE — Assessment & Plan Note (Signed)
Controlled on temazepam.

## 2019-04-27 NOTE — Assessment & Plan Note (Signed)
On thyroid replacement.  Follow tsh.  

## 2019-04-27 NOTE — Assessment & Plan Note (Signed)
Blood pressure under good control.  Continue same medication regimen.  Follow pressures.  Follow metabolic panel.   

## 2019-04-27 NOTE — Progress Notes (Signed)
Patient ID: Julie Hoover, female   DOB: 01-27-1930, 86 y.o.   MRN: [id number]   Virtual Visit via telephone Note  This visit type was conducted due to national recommendations for restrictions regarding the COVID-19 pandemic (e.g. social distancing).  This format is felt to be most appropriate for this patient at this time.  All issues noted in this document were discussed and addressed.  No physical exam was performed (except for noted visual exam findings with Video Visits).   I connected with Skipper Cliche by telephone and verified that I am speaking with the correct person using two identifiers. Location patient: home Location provider: work Persons participating in the telephone visit: patient, provider  I discussed the limitations, risks, security and privacy concerns of performing an evaluation and management service by telephone and the availability of in person appointments. The patient expressed understanding and agreed to proceed.   Reason for visit: scheduled follow up.   HPI: She reports she is doing well.  Feels good.  Not exercising as much.  No chest pain or sob reported.  Acid reflux controlled on bid omeprazole.  Discussed a trial of decreasing the dose of omeprazole. No chest pain or sob reported.  No abdominal pain.  Minimal constipation.  Taking probiotic.  Discussed miralax.  Some minimal unsteadiness.  Discussed therapy.  Discussed using cane/walker.  Does not feel needs.  AM blood sugars averaging 90-112.     ROS: See pertinent positives and negatives per HPI.  Past Medical History:  Diagnosis Date  . Arthritis   . Breast cancer ([place]) 1986   s/p right lumpectomy, XRT and Tamoxifen, mastectomy  . Cancer ([place])    skin ca  . Chicken pox   . Colon polyps   . Diabetes ([place])   . Diverticulosis   . Environmental allergies   . GERD (gastroesophageal reflux disease)   . Glaucoma   . Hypercholesterolemia   . Hyperglycemia   . Hypertension   .  Hypothyroidism   . Osteopenia   . Peripheral neuropathy   . Personal history of radiation therapy 1986  . Urethral stricture     Past Surgical History:  Procedure Laterality Date  . BREAST BIOPSY Right 6/86   lumpectomy-positive/rad  . BREAST LUMPECTOMY  1986   right  . ESOPHAGOGASTRODUODENOSCOPY (EGD) WITH PROPOFOL N/A 12/17/2015   Procedure: ESOPHAGOGASTRODUODENOSCOPY (EGD) WITH PROPOFOL;  Surgeon: Lollie Sails, MD;  Location: [facility] ENDOSCOPY;  Service: Endoscopy;  Laterality: N/A;  . EYE SURGERY     Bilateral cataracts removed  . KNEE SURGERY  06/2008   torn meniscus, right  . KNEE SURGERY  2012   torn menisucs, right  . MASTECTOMY Right 1993   right, recurrence  . TUBAL LIGATION    . URETHRAL DILATION      Family History  Problem Relation Age of Onset  . Heart disease Father   . Hypertension Father   . COPD Mother   . Hypertension Brother        x2  . Hypercholesterolemia Brother        x2  . Hypertension Sister   . Hypercholesterolemia Sister   . Diabetes Mellitus II Sister   . Cancer Other        mouth, aunt  . Colon cancer Neg Hx   . Breast cancer Neg Hx     SOCIAL HX: reviewed.    Current Outpatient Medications:  .  amLODipine (NORVASC) 5 MG tablet, TAKE 1 TABLET EVERY DAY, Disp: 90 tablet,  Rfl: 3 .  aspirin 81 MG tablet, Take 81 mg by mouth daily., Disp: , Rfl:  .  Blood Glucose Monitoring Suppl (ONETOUCH VERIO IQ SYSTEM) W/DEVICE KIT, Use as directed Dx: E11.9 (Patient taking differently: Check sugars twice a day, twice a week Dx: E11.9), Disp: 1 kit, Rfl: 0 .  Calcium Carbonate-Vitamin D 600-400 MG-UNIT chew tablet, Chew 1 tablet by mouth 2 (two) times daily., Disp: , Rfl:  .  cetirizine (ZYRTEC) 10 MG tablet, Take 10 mg by mouth daily., Disp: , Rfl:  .  Chromium-Cinnamon (CINNAMON PLUS CHROMIUM PO), Take by mouth., Disp: , Rfl:  .  dorzolamide-timolol (COSOPT) 22.3-6.8 MG/ML ophthalmic solution, Place 1 drop into the left eye 2 (two) times daily. ,  Disp: , Rfl:  .  gabapentin (NEURONTIN) 100 MG capsule, TAKE 1 CAPSULE EVERY MORNING  AND TAKE 2 CAPSULES AT BEDTIME, Disp: 270 capsule, Rfl: 1 .  glucose blood (ONETOUCH VERIO) test strip, CHECK SUGAR ONCE DAILY. Dx E 11.9, Disp: 100 each, Rfl: 3 .  latanoprost (XALATAN) 0.005 % ophthalmic solution, Place 1 drop into the left eye at bedtime., Disp: , Rfl: 5 .  levothyroxine (SYNTHROID) 50 MCG tablet, TAKE 1 TABLET (50 MCG TOTAL) BY MOUTH DAILY., Disp: 90 tablet, Rfl: 1 .  omeprazole (PRILOSEC) 20 MG capsule, TAKE 1 CAPSULE TWICE DAILY, Disp: 180 capsule, Rfl: 1 .  OneTouch Delica Lancets 01U MISC, 1 applicator by Does not apply route daily. to check blood sugars. Dx: E11.9, Disp: 100 each, Rfl: 3 .  rosuvastatin (CRESTOR) 10 MG tablet, TAKE 1 TABLET EVERY DAY, Disp: 90 tablet, Rfl: 1 .  temazepam (RESTORIL) 30 MG capsule, Take 1 capsule (30 mg total) by mouth at bedtime as needed., Disp: 30 capsule, Rfl: 0  EXAM:  GENERAL: alert.  Sounds to be in no acute distress.  Answering questions appropriately.    PSYCH/NEURO: pleasant and cooperative, no obvious depression or anxiety, speech and thought processing grossly intact  ASSESSMENT AND PLAN:  Discussed the following assessment and plan:  B12 deficiency Recent B12 level ok.  On oral B12.  Follow.    GERD (gastroesophageal reflux disease) Controlled on bid omeprazole.  Discussed decreasing dose.  Follow.    History of breast cancer Mammogram 01/10/19 - Birads I.    Hypercholesterolemia On crestor.  Low cholesterol diet and exercise.  Follow lipid panel and liver function tests.    Hypertension Blood pressure under good control.  Continue same medication regimen.  Follow pressures.  Follow metabolic panel.    Hypothyroidism On thyroid replacement.  Follow tsh.    Peripheral neuropathy On gabapentin.  Continue.  Follow.    Sleep disturbance Controlled on temazepam.    Type 2 diabetes mellitus with diabetic polyneuropathy,  without long-term current use of insulin ([place]) Bowels doing better.  Tolerating metformin.  a1c improved.  Follow met b and a1c.    Orders Placed This Encounter  Procedures  . Hemoglobin A1c    Standing Status:   Future    Standing Expiration Date:   04/26/2020  . Hepatic function panel    Standing Status:   Future    Standing Expiration Date:   04/26/2020  . Lipid panel    Standing Status:   Future    Standing Expiration Date:   04/26/2020  . Basic metabolic panel (future)    Standing Status:   Future    Standing Expiration Date:   04/26/2020     I discussed the assessment and treatment plan with  the patient. The patient was provided an opportunity to ask questions and all were answered. The patient agreed with the plan and demonstrated an understanding of the instructions.   The patient was advised to call back or seek an in-person evaluation if the symptoms worsen or if the condition fails to improve as anticipated.  I provided 23 minutes of non-face-to-face time during this encounter.   Einar Pheasant, MD

## 2019-04-27 NOTE — Assessment & Plan Note (Signed)
On crestor.  Low cholesterol diet and exercise.  Follow lipid panel and liver function tests.   

## 2019-05-04 ENCOUNTER — Other Ambulatory Visit: Payer: Self-pay | Admitting: Internal Medicine

## 2019-05-17 ENCOUNTER — Telehealth: Payer: Self-pay | Admitting: Internal Medicine

## 2019-05-17 DIAGNOSIS — E1142 Type 2 diabetes mellitus with diabetic polyneuropathy: Secondary | ICD-10-CM

## 2019-05-17 MED ORDER — ACCU-CHEK AVIVA CONNECT W/DEVICE KIT: 1.0000 "application " | PACK | Freq: Every day | 0 refills | Status: DC

## 2019-05-17 MED ORDER — ACCU-CHEK AVIVA DEVI: 0 refills | Status: DC

## 2019-05-17 MED ORDER — ACCU-CHEK AVIVA PLUS VI STRP: ORAL_STRIP | 3 refills | Status: DC

## 2019-05-17 MED ORDER — ACCU-CHEK MULTICLIX LANCETS MISC: 3 refills | Status: DC

## 2019-05-17 NOTE — Telephone Encounter (Signed)
Pt called and stated that [facility] in [place] told her that she needs the doctor to write another prescription for her glucose meter and strips. They said her insurance would cover Accucheck brand. She was wondering if she would need new lancets also? She is asking for a call back. She has about a week worth but it is out of glucose strips.

## 2019-05-17 NOTE — Telephone Encounter (Signed)
Pt called in to speak to someone again regarding her lancets. Please advise

## 2019-05-17 NOTE — Telephone Encounter (Signed)
Called to notify patient iIhad sent this in but patient says now wants old strips and [facility] will contact office with information and script.

## 2019-05-17 NOTE — Telephone Encounter (Signed)
Spoke with  Patient and sent scripts to [facility]. For meter, strips and Lancets.

## 2019-05-17 NOTE — Addendum Note (Signed)
Addended by: Nanci Pina on: 05/17/2019 04:13 PM   Modules accepted: Orders

## 2019-05-26 ENCOUNTER — Other Ambulatory Visit: Payer: Self-pay

## 2019-05-26 MED ORDER — GABAPENTIN 100 MG PO CAPS: ORAL_CAPSULE | ORAL | 1 refills | Status: DC

## 2019-06-01 DIAGNOSIS — H401133 Primary open-angle glaucoma, bilateral, severe stage: Secondary | ICD-10-CM | POA: Diagnosis not present

## 2019-06-10 DIAGNOSIS — H401133 Primary open-angle glaucoma, bilateral, severe stage: Secondary | ICD-10-CM | POA: Diagnosis not present

## 2019-07-01 ENCOUNTER — Other Ambulatory Visit: Payer: Self-pay | Admitting: Internal Medicine

## 2019-07-16 ENCOUNTER — Other Ambulatory Visit: Payer: Self-pay | Admitting: Internal Medicine

## 2019-07-18 ENCOUNTER — Other Ambulatory Visit: Payer: Self-pay | Admitting: Internal Medicine

## 2019-07-18 NOTE — Telephone Encounter (Signed)
Patient is requesting a refill on temazepam (RESTORIL) 30 MG capsule. Patient would like the prescription sent to her mail order pharmacy, [facility]. This is the first time using [facility] for this medi action.Marland Kitchen

## 2019-07-20 NOTE — Telephone Encounter (Signed)
Pt is checking on the prescription. She is about out. Please send to [facility] order.

## 2019-07-20 NOTE — Telephone Encounter (Signed)
Refill request for restoril, last seen 04-27-19, last filled 02-11-19.  Please advise.

## 2019-07-21 MED ORDER — TEMAZEPAM 30 MG PO CAPS: 30.0000 mg | ORAL_CAPSULE | Freq: Every evening | ORAL | 0 refills | Status: DC | PRN

## 2019-07-21 NOTE — Telephone Encounter (Signed)
rx sent in for restoril to mail order.  Please notify pt.

## 2019-07-21 NOTE — Addendum Note (Signed)
Addended by: Alisa Graff on: 07/21/2019 04:39 AM   Modules accepted: Orders

## 2019-08-22 ENCOUNTER — Other Ambulatory Visit (INDEPENDENT_AMBULATORY_CARE_PROVIDER_SITE_OTHER): Payer: Medicare HMO

## 2019-08-22 ENCOUNTER — Other Ambulatory Visit: Payer: Self-pay

## 2019-08-22 DIAGNOSIS — E7800 Pure hypercholesterolemia, unspecified: Secondary | ICD-10-CM | POA: Diagnosis not present

## 2019-08-22 DIAGNOSIS — E1142 Type 2 diabetes mellitus with diabetic polyneuropathy: Secondary | ICD-10-CM | POA: Diagnosis not present

## 2019-08-22 LAB — BASIC METABOLIC PANEL
BUN: 12 mg/dL (ref 6–23)
CO2: 30 mEq/L (ref 19–32)
Calcium: 9.9 mg/dL (ref 8.4–10.5)
Chloride: 105 mEq/L (ref 96–112)
Creatinine, Ser: 0.91 mg/dL (ref 0.40–1.20)
GFR: 58.36 mL/min — ABNORMAL LOW (ref >60.00)
Glucose, Bld: 107 mg/dL — ABNORMAL HIGH (ref 70–99)
Potassium: 4.8 mEq/L (ref 3.5–5.1)
Sodium: 141 mEq/L (ref 135–145)

## 2019-08-22 LAB — LIPID PANEL
Cholesterol: 182 mg/dL (ref 0–200)
HDL: 74.3 mg/dL (ref >39.00)
LDL Cholesterol: 93 mg/dL (ref 0–99)
NonHDL: 107.83
Total CHOL/HDL Ratio: 2
Triglycerides: 72 mg/dL (ref 0.0–149.0)
VLDL: 14.4 mg/dL (ref 0.0–40.0)

## 2019-08-22 LAB — HEPATIC FUNCTION PANEL
ALT: 14 U/L (ref 0–35)
AST: 17 U/L (ref 0–37)
Albumin: 4.4 g/dL (ref 3.5–5.2)
Alkaline Phosphatase: 59 U/L (ref 39–117)
Bilirubin, Direct: 0.1 mg/dL (ref 0.0–0.3)
Total Bilirubin: 0.7 mg/dL (ref 0.2–1.2)
Total Protein: 7.5 g/dL (ref 6.0–8.3)

## 2019-08-22 LAB — HEMOGLOBIN A1C: Hgb A1c MFr Bld: 6.4 % (ref 4.6–6.5)

## 2019-08-23 ENCOUNTER — Encounter: Payer: Self-pay | Admitting: Internal Medicine

## 2019-08-29 ENCOUNTER — Other Ambulatory Visit: Payer: Medicare HMO

## 2019-09-07 ENCOUNTER — Other Ambulatory Visit: Payer: Self-pay

## 2019-09-07 ENCOUNTER — Encounter: Payer: Self-pay | Admitting: Internal Medicine

## 2019-09-07 ENCOUNTER — Ambulatory Visit (INDEPENDENT_AMBULATORY_CARE_PROVIDER_SITE_OTHER): Payer: Medicare HMO | Admitting: Internal Medicine

## 2019-09-07 DIAGNOSIS — G479 Sleep disorder, unspecified: Secondary | ICD-10-CM

## 2019-09-07 DIAGNOSIS — F439 Reaction to severe stress, unspecified: Secondary | ICD-10-CM

## 2019-09-07 DIAGNOSIS — E039 Hypothyroidism, unspecified: Secondary | ICD-10-CM

## 2019-09-07 DIAGNOSIS — I10 Essential (primary) hypertension: Secondary | ICD-10-CM

## 2019-09-07 DIAGNOSIS — E7800 Pure hypercholesterolemia, unspecified: Secondary | ICD-10-CM | POA: Diagnosis not present

## 2019-09-07 DIAGNOSIS — Z853 Personal history of malignant neoplasm of breast: Secondary | ICD-10-CM

## 2019-09-07 DIAGNOSIS — E538 Deficiency of other specified B group vitamins: Secondary | ICD-10-CM

## 2019-09-07 DIAGNOSIS — E1142 Type 2 diabetes mellitus with diabetic polyneuropathy: Secondary | ICD-10-CM

## 2019-09-07 DIAGNOSIS — G6289 Other specified polyneuropathies: Secondary | ICD-10-CM | POA: Diagnosis not present

## 2019-09-07 DIAGNOSIS — K2100 Gastro-esophageal reflux disease with esophagitis, without bleeding: Secondary | ICD-10-CM

## 2019-09-07 NOTE — Progress Notes (Signed)
Patient ID: Julie Hoover, female   DOB: 1929/11/16, 86 y.o.   MRN: [id number]   Subjective:    Patient ID: Julie Hoover, female    DOB: 1929/10/13, 86 y.o.   MRN: [id number]  HPI This visit occurred during the SARS-CoV-2 public health emergency.  Safety protocols were in place, including screening questions prior to the visit, additional usage of staff PPE, and extensive cleaning of exam room while observing appropriate contact time as indicated for disinfecting solutions.  Patient here for a scheduled follow up.  Doing relatively well.  No chest pain or tightness with increased activity or exertion. Does have chest pain with palpation.  No sob.  No increased cough or congestion.  No abdominal pain.  Constipation is better with prune juice and eating prunes.  Sugars doing well.  Reviewed outside readings.  Most am readings:  90-110.  Some dysphagia.  Mild depression. Still trying to cope with family stress.  Discussed with her today.  Some decreased appetite.  Needing temazepam prn to help with sleep.  Discussed remeron.  She wants to hold at this time.    Past Medical History:  Diagnosis Date  . Arthritis   . Breast cancer ([place]) 1986   s/p right lumpectomy, XRT and Tamoxifen, mastectomy  . Cancer ([place])    skin ca  . Chicken pox   . Colon polyps   . Diabetes ([place])   . Diverticulosis   . Environmental allergies   . GERD (gastroesophageal reflux disease)   . Glaucoma   . Hypercholesterolemia   . Hyperglycemia   . Hypertension   . Hypothyroidism   . Osteopenia   . Peripheral neuropathy   . Personal history of radiation therapy 1986  . Urethral stricture    Past Surgical History:  Procedure Laterality Date  . BREAST BIOPSY Right 6/86   lumpectomy-positive/rad  . BREAST LUMPECTOMY  1986   right  . ESOPHAGOGASTRODUODENOSCOPY (EGD) WITH PROPOFOL N/A 12/17/2015   Procedure: ESOPHAGOGASTRODUODENOSCOPY (EGD) WITH PROPOFOL;  Surgeon: Lollie Sails, MD;  Location:  [facility] ENDOSCOPY;  Service: Endoscopy;  Laterality: N/A;  . EYE SURGERY     Bilateral cataracts removed  . KNEE SURGERY  06/2008   torn meniscus, right  . KNEE SURGERY  2012   torn menisucs, right  . MASTECTOMY Right 1993   right, recurrence  . TUBAL LIGATION    . URETHRAL DILATION     Family History  Problem Relation Age of Onset  . Heart disease Father   . Hypertension Father   . COPD Mother   . Hypertension Brother        x2  . Hypercholesterolemia Brother        x2  . Hypertension Sister   . Hypercholesterolemia Sister   . Diabetes Mellitus II Sister   . Cancer Other        mouth, aunt  . Colon cancer Neg Hx   . Breast cancer Neg Hx    Social History   Socioeconomic History  . Marital status: Widowed    Spouse name: Not on file  . Number of children: 2  . Years of education: Not on file  . Highest education level: Not on file  Occupational History  . Not on file  Tobacco Use  . Smoking status: Never Smoker  . Smokeless tobacco: Never Used  Substance and Sexual Activity  . Alcohol use: No    Alcohol/week: 0.0 standard drinks  . Drug use: No  . Sexual activity:  Never  Other Topics Concern  . Not on file  Social History Narrative  . Not on file   Social Determinants of Health   Financial Resource Strain:   . Difficulty of Paying Living Expenses:   Food Insecurity:   . Worried About Charity fundraiser in the Last Year:   . Arboriculturist in the Last Year:   Transportation Needs:   . Film/video editor (Medical):   Marland Kitchen Lack of Transportation (Non-Medical):   Physical Activity:   . Days of Exercise per Week:   . Minutes of Exercise per Session:   Stress:   . Feeling of Stress :   Social Connections:   . Frequency of Communication with Friends and Family:   . Frequency of Social Gatherings with Friends and Family:   . Attends Religious Services:   . Active Member of Clubs or Organizations:   . Attends Archivist Meetings:   Marland Kitchen Marital  Status:     Outpatient Encounter Medications as of 09/07/2019  Medication Sig  . amLODipine (NORVASC) 5 MG tablet TAKE 1 TABLET EVERY DAY  . aspirin 81 MG tablet Take 81 mg by mouth daily.  . Blood Glucose Monitoring Suppl (ACCU-CHEK AVIVA) device Use as instructed  . Calcium Carbonate-Vitamin D 600-400 MG-UNIT chew tablet Chew 1 tablet by mouth 2 (two) times daily.  . cetirizine (ZYRTEC) 10 MG tablet Take 10 mg by mouth daily.  . Chromium-Cinnamon (CINNAMON PLUS CHROMIUM PO) Take by mouth.  . dorzolamide-timolol (COSOPT) 22.3-6.8 MG/ML ophthalmic solution Place 1 drop into the left eye 2 (two) times daily.   Marland Kitchen gabapentin (NEURONTIN) 100 MG capsule Take 1 capsule every morning and take 2 capsules at bedtime.  Marland Kitchen glucose blood (ACCU-CHEK AVIVA PLUS) test strip Use as instructed: To Check Blood sugar Once daily  DX E11.9  . Lancets (ACCU-CHEK MULTICLIX) lancets Use as instructed: To check blood sugar Once daily. E11.9  . latanoprost (XALATAN) 0.005 % ophthalmic solution Place 1 drop into the left eye at bedtime.  Marland Kitchen levothyroxine (SYNTHROID) 50 MCG tablet TAKE 1 TABLET EVERY DAY  . omeprazole (PRILOSEC) 20 MG capsule TAKE 1 CAPSULE TWICE DAILY  . rosuvastatin (CRESTOR) 10 MG tablet TAKE 1 TABLET EVERY DAY  . [DISCONTINUED] temazepam (RESTORIL) 30 MG capsule Take 1 capsule (30 mg total) by mouth at bedtime as needed.   No facility-administered encounter medications on file as of 09/07/2019.   Review of Systems  Constitutional: Negative for appetite change and unexpected weight change.  HENT: Negative for congestion and sinus pressure.   Respiratory: Negative for cough, chest tightness and shortness of breath.   Cardiovascular: Negative for chest pain, palpitations and leg swelling.  Gastrointestinal: Negative for abdominal pain, diarrhea, nausea and vomiting.  Genitourinary: Negative for difficulty urinating and dysuria.  Musculoskeletal: Negative for joint swelling and myalgias.  Skin:  Negative for color change and rash.  Neurological: Negative for dizziness, light-headedness and headaches.  Psychiatric/Behavioral: Negative for agitation and dysphoric mood.       Objective:    Physical Exam Vitals reviewed.  Constitutional:      General: She is not in acute distress.    Appearance: Normal appearance.  HENT:     Head: Normocephalic and atraumatic.     Right Ear: External ear normal.     Left Ear: External ear normal.  Eyes:     General:        Right eye: No discharge.  Left eye: No discharge.     Conjunctiva/sclera: Conjunctivae normal.  Neck:     Thyroid: No thyromegaly.  Cardiovascular:     Rate and Rhythm: Normal rate and regular rhythm.  Pulmonary:     Effort: No respiratory distress.     Breath sounds: Normal breath sounds. No wheezing.  Abdominal:     General: Bowel sounds are normal.     Palpations: Abdomen is soft.     Tenderness: There is no abdominal tenderness.  Musculoskeletal:        General: No swelling or tenderness.     Cervical back: Neck supple. No tenderness.  Lymphadenopathy:     Cervical: No cervical adenopathy.  Skin:    Findings: No erythema or rash.  Neurological:     Mental Status: She is alert.  Psychiatric:        Mood and Affect: Mood normal.        Behavior: Behavior normal.     BP 126/70   Pulse 70   Temp (!) 97.3 F (36.3 C)   Resp 16   Ht _0  (1.651 m)   Wt 143 lb (64.9 kg)   SpO2 97%   BMI 23.80 kg/m  Wt Readings from Last 3 Encounters:  09/07/19 143 lb (64.9 kg)  04/27/19 144 lb (65.3 kg)  12/21/18 150 lb (68 kg)     Lab Results  Component Value Date   WBC 6.7 12/09/2018   HGB 13.6 12/09/2018   HCT 41.3 12/09/2018   PLT 225.0 12/09/2018   GLUCOSE 107 (H) 08/22/2019   CHOL 182 08/22/2019   TRIG 72.0 08/22/2019   HDL 74.30 08/22/2019   LDLCALC 93 08/22/2019   ALT 14 08/22/2019   AST 17 08/22/2019   NA 141 08/22/2019   K 4.8 08/22/2019   CL 105 08/22/2019   CREATININE 0.91  08/22/2019   BUN 12 08/22/2019   CO2 30 08/22/2019   TSH 2.16 12/09/2018   HGBA1C 6.4 08/22/2019   MICROALBUR 1.2 07/07/2018    DG Bone Density  Result Date: 01/10/2019 EXAM: DUAL X-RAY ABSORPTIOMETRY (DXA) FOR BONE MINERAL DENSITY IMPRESSION: crr Your patient Nevaya Nagele completed a BMD test on 01/10/2019 using the [place] (analysis version: 14.10) manufactured by [employer]. The following summarizes the results of our evaluation. PATIENT BIOGRAPHICAL: Name: Neviah, Braud Patient ID: [id number] Birth Date: [birth year] Height: 64.0 in. Gender: Female Exam Date: 01/10/2019 Weight: 144.6 lbs. Indications: Advanced Age, arthritis, Caucasian, diabetic, Height Loss, History of Fracture (Adult), hx breast ca, POSTmenopausal Fractures: rt foot Treatments: 81 MG ASPIRIN, calcium /vit d, GABAPENTIN, levothyroxine ASSESSMENT: The BMD measured at Femur Neck Left is 0.872 g/cm2 with a T-score of -1.2. This patient is considered osteopenic according to [facility]) criteria.L-1 was excluded due to degenerative changes.The scan quality is good. Site Region Measured Measured WHO Young Adult BMD Date       Age      Classification T-score AP Spine L2-L4 01/10/2019 87.2 Normal -0.5 1.141 g/cm2 DualFemur Neck Left 01/10/2019 87.2 Osteopenia -1.2 0.872 g/cm2 DualFemur Neck Left 11[birth year]01/2013 82.0 Normal -0.7 0.943 g/cm2 DualFemur Total Mean 01/10/2019 87.2 Normal -0.5 0.942 g/cm2 DualFemur Total Mean 11/01/2013 82.0 Normal -0.4 0.953 g/cm2 World Health Organization [facility]) criteria for post-menopausal, Caucasian Women: Normal:       T-score at or above -1 SD Osteopenia:   T-score between -1 and -2.5 SD Osteoporosis: T-score at or below -2.5 SD RECOMMENDATIONS: 1. All patients should optimize calcium  and vitamin D intake. 2. Consider FDA-approved medical therapies in postmenopausal women and men aged 60 years and older, based on the following: a. A hip or vertebral (clinical or  morphometric) fracture b. T-score < -2.5 at the femoral neck or spine after appropriate evaluation to exclude secondary causes c. Low bone mass (T-score between -1.0 and -2.5 at the femoral neck or spine) and a 10-year probability of a hip fracture > 3% or a 10-year probability of a major osteoporosis-related fracture > 20% based on the [place]-adapted WHO algorithm d. Clinician judgment and/or patient preferences may indicate treatment for people with 10-year fracture probabilities above or below these levels FOLLOW-UP: People with diagnosed cases of osteoporosis or at high risk for fracture should have regular bone mineral density tests. For patients eligible for Medicare, routine testing is allowed once every 2 years. The testing frequency can be increased to one year for patients who have rapidly progressing disease, those who are receiving or discontinuing medical therapy to restore bone mass, or have additional risk factors. I have reviewed this report, and agree with the above findings. [facility] crr Your patient VIRIGINIA AMENDOLA completed a FRAX assessment on 01/10/2019 using the [place] (analysis version: 14.10) manufactured by [employer]. The following summarizes the results of our evaluation. PATIENT BIOGRAPHICAL: Name: Candy, Leverett Patient ID: [id number] Birth Date: 1930/04/13 Height:    64.0 in. Gender:     Female    [age]       Weight:    144.6 lbs. Ethnicity:  White                            Exam Date: 01/10/2019 FRAX* RESULTS:  (version: 3.5) 10-year Probability of Fracture1 Major Osteoporotic Fracture2 Hip Fracture 16.5% 4.0% Population: [place] (Caucasian) Risk Factors: History of Fracture (Adult) Based on Femur (Left) Neck BMD 1 -The 10-year probability of fracture may be lower than reported if the patient has received treatment. 2 -Major Osteoporotic Fracture: Clinical Spine, Forearm, Hip or Shoulder *FRAX is a Materials engineer of the [employer] for Metabolic Bone Disease, a [place] (WHO) [facility]. ASSESSMENT: The probability of a major osteoporotic fracture is 16.5 within the next ten years. The probability of a hip fracture is 4.0 within the next ten years. Electronically Signed   By: Lowella Grip III M.D.   On: 01/10/2019 14:55   MM 3D SCREEN BREAST UNI LEFT  Result Date: 01/10/2019 CLINICAL DATA:  Screening. EXAM: DIGITAL SCREENING UNILATERAL LEFT MAMMOGRAM WITH CAD AND TOMO COMPARISON:  Previous exam(s). ACR Breast Density Category b: There are scattered areas of fibroglandular density. FINDINGS: There are no findings suspicious for malignancy. Images were processed with CAD. IMPRESSION: No mammographic evidence of malignancy. A result letter of this screening mammogram will be mailed directly to the patient. RECOMMENDATION: Screening mammogram in one year. (Code:SM-B-01Y) BI-RADS CATEGORY  1: Negative. Electronically Signed   By: Lovey Newcomer M.D.   On: 01/10/2019 15:27       Assessment & Plan:   Problem List Items Addressed This Visit    B12 deficiency    Recheck B12 level next visit.        GERD (gastroesophageal reflux disease)    Controlled on omeprazole.        History of breast cancer    Mammogram 01/10/19 - Birads I.  Hypercholesterolemia    On crestor.  Follow lipid panel and liver function tests.        Relevant Orders   Lipid panel   Hepatic function panel   Hypertension    Blood pressure under good control.  Continue same medication regimen - amlodipine.  Follow pressures.  Follow metabolic panel.        Relevant Orders   CBC with Differential/Platelet   Hemoglobin G8B   Basic metabolic panel   Hypothyroidism    On thyroid replacement.  Follow tsh.        Relevant Orders   TSH   Peripheral neuropathy    Continue gabapentin.        Sleep disturbance    See above. Discussed trial of remeron.  Request refill temazepam.  Will notify me  if desires trial of remeron.        Stress    Discussed with her today.  Discussed trial of remeron to help with sleep and appetite. She declines at this time.  Did request refill on temazepam.  Follow.  Has good support.       Type 2 diabetes mellitus with diabetic polyneuropathy, without long-term current use of insulin ([facility])    Follow met b and a1c.  a1c 6.4 on recent check.        Relevant Orders   Hemoglobin A1c   Microalbumin / creatinine urine ratio       Einar Pheasant, MD

## 2019-09-07 NOTE — Patient Instructions (Signed)
remeron

## 2019-09-12 ENCOUNTER — Telehealth: Payer: Self-pay | Admitting: Internal Medicine

## 2019-09-12 MED ORDER — TEMAZEPAM 30 MG PO CAPS: 30.0000 mg | ORAL_CAPSULE | Freq: Every evening | ORAL | 1 refills | Status: DC | PRN

## 2019-09-12 NOTE — Telephone Encounter (Signed)
Pt needs a refill on temazepam (RESTORIL) 30 MG capsule sent to [facility] in pharmacy ASAP

## 2019-09-19 ENCOUNTER — Encounter: Payer: Self-pay | Admitting: Internal Medicine

## 2019-09-19 NOTE — Assessment & Plan Note (Signed)
See above. Discussed trial of remeron.  Request refill temazepam.  Will notify me if desires trial of remeron.

## 2019-09-19 NOTE — Assessment & Plan Note (Signed)
Blood pressure under good control.  Continue same medication regimen - amlodipine.  Follow pressures.  Follow metabolic panel.

## 2019-09-19 NOTE — Assessment & Plan Note (Signed)
Controlled on omeprazole.   

## 2019-09-19 NOTE — Assessment & Plan Note (Signed)
Mammogram 01/10/19 - Birads I.

## 2019-09-19 NOTE — Assessment & Plan Note (Signed)
Discussed with her today.  Discussed trial of remeron to help with sleep and appetite. She declines at this time.  Did request refill on temazepam.  Follow.  Has good support.

## 2019-09-19 NOTE — Assessment & Plan Note (Signed)
On crestor.  Follow lipid panel and liver function tests.   

## 2019-09-19 NOTE — Assessment & Plan Note (Signed)
Recheck B12 level next visit.

## 2019-09-19 NOTE — Assessment & Plan Note (Signed)
Continue gabapentin.

## 2019-09-19 NOTE — Assessment & Plan Note (Signed)
On thyroid replacement.  Follow tsh.  

## 2019-09-19 NOTE — Assessment & Plan Note (Signed)
Follow met b and a1c.  a1c 6.4 on recent check.

## 2019-09-21 MED ORDER — TEMAZEPAM 30 MG PO CAPS: 30.0000 mg | ORAL_CAPSULE | Freq: Every evening | ORAL | 0 refills | Status: DC | PRN

## 2019-09-21 NOTE — Telephone Encounter (Signed)
Pt called and said that [facility] has not received this rx

## 2019-09-21 NOTE — Telephone Encounter (Signed)
Rx was printed on 09/12/19. [employer] did not receive. Can you resend for her electronically?

## 2019-09-21 NOTE — Telephone Encounter (Signed)
Pt aware.

## 2019-09-21 NOTE — Telephone Encounter (Signed)
rx sent to [id number] (temazepam)

## 2019-09-21 NOTE — Addendum Note (Signed)
Addended by: Alisa Graff on: 09/21/2019 10:57 AM   Modules accepted: Orders

## 2019-10-05 ENCOUNTER — Other Ambulatory Visit: Payer: Self-pay

## 2019-10-05 MED ORDER — GABAPENTIN 100 MG PO CAPS: ORAL_CAPSULE | ORAL | 1 refills | Status: DC

## 2019-11-23 ENCOUNTER — Ambulatory Visit (INDEPENDENT_AMBULATORY_CARE_PROVIDER_SITE_OTHER): Payer: Medicare HMO

## 2019-11-23 VITALS — Ht 65.0 in | Wt 138.0 lb

## 2019-11-23 DIAGNOSIS — Z0000 Encounter for general adult medical examination without abnormal findings: Secondary | ICD-10-CM | POA: Diagnosis not present

## 2019-11-23 NOTE — Patient Instructions (Addendum)
Julie Hoover , Thank you for taking time to come for your Medicare Wellness Visit. I appreciate your ongoing commitment to your health goals. Please review the following plan we discussed and let me know if I can assist you in the future.   These are the goals we discussed: Goals      Patient Stated   .  Increase physical activity (pt-stated)      Walk the treadmill 15 minutes in the morning, 15 minutes in the evening       This is a list of the screening recommended for you and due dates:  Health Maintenance  Topic Date Due  . Urine Protein Check  07/07/2019  . Flu Shot  11/20/2019  . COVID-19 Vaccine (1) 12/09/2019*  . Complete foot exam   12/21/2019  . Mammogram  01/10/2020  . Hemoglobin A1C  02/22/2020  . Eye exam for diabetics  07/03/2020  . Tetanus Vaccine  01/18/2023  . DEXA scan (bone density measurement)  Completed  . Pneumonia vaccines  Completed  *Topic was postponed. The date shown is not the original due date.    Immunizations Immunization History  Administered Date(s) Administered  . Fluad Quad(high Dose 65+) 12/21/2018  . Influenza Split 01/18/2012  . Influenza, High Dose Seasonal PF 01/31/2016, 01/20/2017, 01/05/2018  . Influenza,inj,Quad PF,6+ Mos 01/12/2013, 12/14/2013, 01/09/2015  . Pneumococcal Conjugate-13 05/17/2013  . Pneumococcal Polysaccharide-23 01/29/2015  . Tdap 01/17/2013  . Zoster 03/26/2006   Keep all routine maintenance appointments.   Next scheduled lab 01/05/20   Follow up 01/09/20 @ 10:30  Advanced directives: End of life planning; Advance aging; Advanced directives discussed.  Copy of current HCPOA/Living Will requested.    Conditions/risks identified: none new.   Follow up in one year for your annual wellness visit.   Preventive Care 84 Years and Older, Female Preventive care refers to lifestyle choices and visits with your health care provider that can promote health and wellness. What does preventive care include?  A  yearly physical exam. This is also called an annual well check.  Dental exams once or twice a year.  Routine eye exams. Ask your health care provider how often you should have your eyes checked.  Personal lifestyle choices, including:  Daily care of your teeth and gums.  Regular physical activity.  Eating a healthy diet.  Avoiding tobacco and drug use.  Limiting alcohol use.  Practicing safe sex.  Taking low-dose aspirin every day.  Taking vitamin and mineral supplements as recommended by your health care provider. What happens during an annual well check? The services and screenings done by your health care provider during your annual well check will depend on your age, overall health, lifestyle risk factors, and family history of disease. Counseling  Your health care provider may ask you questions about your:  Alcohol use.  Tobacco use.  Drug use.  Emotional well-being.  Home and relationship well-being.  Sexual activity.  Eating habits.  History of falls.  Memory and ability to understand (cognition).  Work and work Statistician.  Reproductive health. Screening  You may have the following tests or measurements:  Height, weight, and BMI.  Blood pressure.  Lipid and cholesterol levels. These may be checked every 5 years, or more frequently if you are over 46 years old.  Skin check.  Lung cancer screening. You may have this screening every year starting at age 7 if you have a 30-pack-year history of smoking and currently smoke or have quit within the past  15 years.  Fecal occult blood test (FOBT) of the stool. You may have this test every year starting at age 40.  Flexible sigmoidoscopy or colonoscopy. You may have a sigmoidoscopy every 5 years or a colonoscopy every 10 years starting at age 25.  Hepatitis C blood test.  Hepatitis B blood test.  Sexually transmitted disease (STD) testing.  Diabetes screening. This is done by checking your blood  sugar (glucose) after you have not eaten for a while (fasting). You may have this done every 1-3 years.  Bone density scan. This is done to screen for osteoporosis. You may have this done starting at age 79.  Mammogram. This may be done every 1-2 years. Talk to your health care provider about how often you should have regular mammograms. Talk with your health care provider about your test results, treatment options, and if necessary, the need for more tests. Vaccines  Your health care provider may recommend certain vaccines, such as:  Influenza vaccine. This is recommended every year.  Tetanus, diphtheria, and acellular pertussis (Tdap, Td) vaccine. You may need a Td booster every 10 years.  Zoster vaccine. You may need this after age 36.  Pneumococcal 13-valent conjugate (PCV13) vaccine. One dose is recommended after age 43.  Pneumococcal polysaccharide (PPSV23) vaccine. One dose is recommended after age 2. Talk to your health care provider about which screenings and vaccines you need and how often you need them. This information is not intended to replace advice given to you by your health care provider. Make sure you discuss any questions you have with your health care provider. Document Released: 05/04/2015 Document Revised: 12/26/2015 Document Reviewed: 02/06/2015 Elsevier Interactive Patient Education  2017 [place] Prevention in the Home Falls can cause injuries. They can happen to people of all ages. There are many things you can do to make your home safe and to help prevent falls. What can I do on the outside of my home?  Regularly fix the edges of walkways and driveways and fix any cracks.  Remove anything that might make you trip as you walk through a door, such as a raised step or threshold.  Trim any bushes or trees on the path to your home.  Use bright outdoor lighting.  Clear any walking paths of anything that might make someone trip, such as rocks or  tools.  Regularly check to see if handrails are loose or broken. Make sure that both sides of any steps have handrails.  Any raised decks and porches should have guardrails on the edges.  Have any leaves, snow, or ice cleared regularly.  Use sand or salt on walking paths during winter.  Clean up any spills in your garage right away. This includes oil or grease spills. What can I do in the bathroom?  Use night lights.  Install grab bars by the toilet and in the tub and shower. Do not use towel bars as grab bars.  Use non-skid mats or decals in the tub or shower.  If you need to sit down in the shower, use a plastic, non-slip stool.  Keep the floor dry. Clean up any water that spills on the floor as soon as it happens.  Remove soap buildup in the tub or shower regularly.  Attach bath mats securely with double-sided non-slip rug tape.  Do not have throw rugs and other things on the floor that can make you trip. What can I do in the bedroom?  Use night lights.  Make sure that you have a light by your bed that is easy to reach.  Do not use any sheets or blankets that are too big for your bed. They should not hang down onto the floor.  Have a firm chair that has side arms. You can use this for support while you get dressed.  Do not have throw rugs and other things on the floor that can make you trip. What can I do in the kitchen?  Clean up any spills right away.  Avoid walking on wet floors.  Keep items that you use a lot in easy-to-reach places.  If you need to reach something above you, use a strong step stool that has a grab bar.  Keep electrical cords out of the way.  Do not use floor polish or wax that makes floors slippery. If you must use wax, use non-skid floor wax.  Do not have throw rugs and other things on the floor that can make you trip. What can I do with my stairs?  Do not leave any items on the stairs.  Make sure that there are handrails on both  sides of the stairs and use them. Fix handrails that are broken or loose. Make sure that handrails are as long as the stairways.  Check any carpeting to make sure that it is firmly attached to the stairs. Fix any carpet that is loose or worn.  Avoid having throw rugs at the top or bottom of the stairs. If you do have throw rugs, attach them to the floor with carpet tape.  Make sure that you have a light switch at the top of the stairs and the bottom of the stairs. If you do not have them, ask someone to add them for you. What else can I do to help prevent falls?  Wear shoes that:  Do not have high heels.  Have rubber bottoms.  Are comfortable and fit you well.  Are closed at the toe. Do not wear sandals.  If you use a stepladder:  Make sure that it is fully opened. Do not climb a closed stepladder.  Make sure that both sides of the stepladder are locked into place.  Ask someone to hold it for you, if possible.  Clearly mark and make sure that you can see:  Any grab bars or handrails.  First and last steps.  Where the edge of each step is.  Use tools that help you move around (mobility aids) if they are needed. These include:  Canes.  Walkers.  Scooters.  Crutches.  Turn on the lights when you go into a dark area. Replace any light bulbs as soon as they burn out.  Set up your furniture so you have a clear path. Avoid moving your furniture around.  If any of your floors are uneven, fix them.  If there are any pets around you, be aware of where they are.  Review your medicines with your doctor. Some medicines can make you feel dizzy. This can increase your chance of falling. Ask your doctor what other things that you can do to help prevent falls. This information is not intended to replace advice given to you by your health care provider. Make sure you discuss any questions you have with your health care provider. Document Released: 02/01/2009 Document Revised:  09/13/2015 Document Reviewed: 05/12/2014 Elsevier Interactive Patient Education  2017 [employer].

## 2019-11-23 NOTE — Progress Notes (Addendum)
Subjective:   Julie Hoover is a 86 y.o. female who presents for Medicare Annual (Subsequent) preventive examination.  Review of Systems    No ROS.  Medicare Wellness Virtual Visit.   Cardiac Risk Factors include: advanced age (>57men, >74 women);hypertension;diabetes mellitus     Objective:    Today's Vitals   11/23/19 0906  Weight: 138 lb (62.6 kg)  Height: 5\' 5"  (1.651 m)   Body mass index is 22.96 kg/m.  Advanced Directives 11/23/2019 10/28/2016 12/17/2015 10/29/2015  Does Patient Have a Medical Advance Directive? Yes Yes Yes Yes  Type of Advance Directive Healthcare Power of Attorney Living will;Healthcare Power of [place];Living will  Does patient want to make changes to medical advance directive? No - Patient declined No - Patient declined - -  Copy of [place] in Chart? No - copy requested No - copy requested Yes No - copy requested    Current Medications (verified) Outpatient Encounter Medications as of 11/23/2019  Medication Sig  . amLODipine (NORVASC) 5 MG tablet TAKE 1 TABLET EVERY DAY  . aspirin 81 MG tablet Take 81 mg by mouth daily.  . Blood Glucose Monitoring Suppl (ACCU-CHEK AVIVA) device Use as instructed  . Calcium Carbonate-Vitamin D 600-400 MG-UNIT chew tablet Chew 1 tablet by mouth 2 (two) times daily.  . cetirizine (ZYRTEC) 10 MG tablet Take 10 mg by mouth daily.  . Chromium-Cinnamon (CINNAMON PLUS CHROMIUM PO) Take by mouth.  . dorzolamide-timolol (COSOPT) 22.3-6.8 MG/ML ophthalmic solution Place 1 drop into the left eye 2 (two) times daily.   Marland Kitchen gabapentin (NEURONTIN) 100 MG capsule Take 1 capsule every morning and take 2 capsules at bedtime.  Marland Kitchen glucose blood (ACCU-CHEK AVIVA PLUS) test strip Use as instructed: To Check Blood sugar Once daily  DX E11.9  . Lancets (ACCU-CHEK MULTICLIX) lancets Use as instructed: To check blood sugar Once daily. E11.9  . latanoprost (XALATAN) 0.005 % ophthalmic  solution Place 1 drop into the left eye at bedtime.  Marland Kitchen levothyroxine (SYNTHROID) 50 MCG tablet TAKE 1 TABLET EVERY DAY  . omeprazole (PRILOSEC) 20 MG capsule TAKE 1 CAPSULE TWICE DAILY  . rosuvastatin (CRESTOR) 10 MG tablet TAKE 1 TABLET EVERY DAY  . temazepam (RESTORIL) 30 MG capsule Take 1 capsule (30 mg total) by mouth at bedtime as needed.   No facility-administered encounter medications on file as of 11/23/2019.    Allergies (verified) Iodinated diagnostic agents, Ciprofloxacin, Gentian root, Halothane, Hydrocodone, Norco [hydrocodone-acetaminophen], and Sulfa antibiotics   History: Past Medical History:  Diagnosis Date  . Arthritis   . Breast cancer ([place]) 1986   s/p right lumpectomy, XRT and Tamoxifen, mastectomy  . Cancer ([place])    skin ca  . Chicken pox   . Colon polyps   . Diabetes ([place])   . Diverticulosis   . Environmental allergies   . GERD (gastroesophageal reflux disease)   . Glaucoma   . Hypercholesterolemia   . Hyperglycemia   . Hypertension   . Hypothyroidism   . Osteopenia   . Peripheral neuropathy   . Personal history of radiation therapy 1986  . Urethral stricture    Past Surgical History:  Procedure Laterality Date  . BREAST BIOPSY Right 6/86   lumpectomy-positive/rad  . BREAST LUMPECTOMY  1986   right  . ESOPHAGOGASTRODUODENOSCOPY (EGD) WITH PROPOFOL N/A 12/17/2015   Procedure: ESOPHAGOGASTRODUODENOSCOPY (EGD) WITH PROPOFOL;  Surgeon: Lollie Sails, MD;  Location: [facility] ENDOSCOPY;  Service: Endoscopy;  Laterality: N/A;  .  EYE SURGERY     Bilateral cataracts removed  . KNEE SURGERY  06/2008   torn meniscus, right  . KNEE SURGERY  2012   torn menisucs, right  . MASTECTOMY Right 1993   right, recurrence  . TUBAL LIGATION    . URETHRAL DILATION     Family History  Problem Relation Age of Onset  . Heart disease Father   . Hypertension Father   . COPD Mother   . Hypertension Brother        x2  . Hypercholesterolemia Brother        x2  .  Hypertension Sister   . Hypercholesterolemia Sister   . Diabetes Mellitus II Sister   . Cancer Other        mouth, aunt  . Colon cancer Neg Hx   . Breast cancer Neg Hx    Social History   Socioeconomic History  . Marital status: Widowed    Spouse name: Not on file  . Number of children: 2  . Years of education: Not on file  . Highest education level: Not on file  Occupational History  . Not on file  Tobacco Use  . Smoking status: Never Smoker  . Smokeless tobacco: Never Used  Substance and Sexual Activity  . Alcohol use: No    Alcohol/week: 0.0 standard drinks  . Drug use: No  . Sexual activity: Never  Other Topics Concern  . Not on file  Social History Narrative  . Not on file   Social Determinants of Health   Financial Resource Strain: Low Risk   . Difficulty of Paying Living Expenses: Not hard at all  Food Insecurity: No Food Insecurity  . Worried About Charity fundraiser in the Last Year: Never true  . Ran Out of Food in the Last Year: Never true  Transportation Needs: No Transportation Needs  . Lack of Transportation (Medical): No  . Lack of Transportation (Non-Medical): No  Physical Activity:   . Days of Exercise per Week:   . Minutes of Exercise per Session:   Stress: No Stress Concern Present  . Feeling of Stress : Not at all  Social Connections: Unknown  . Frequency of Communication with Friends and Family: More than three times a week  . Frequency of Social Gatherings with Friends and Family: More than three times a week  . Attends Religious Services: Not on file  . Active Member of Clubs or Organizations: Not on file  . Attends Archivist Meetings: Not on file  . Marital Status: Widowed    Tobacco Counseling Counseling given: Not Answered   Clinical Intake:  Pre-visit preparation completed: Yes        Diabetes: Yes (Followed by pcp)  How often do you need to have someone help you when you read instructions, pamphlets, or other  written materials from your doctor or pharmacy?: 1 - Never   Interpreter Needed?: No     Activities of Daily Living In your present state of health, do you have any difficulty performing the following activities: 11/23/2019  Hearing? N  Vision? N  Difficulty concentrating or making decisions? N  Walking or climbing stairs? N  Dressing or bathing? N  Doing errands, shopping? N  Preparing Food and eating ? N  Using the Toilet? N  In the past six months, have you accidently leaked urine? N  Do you have problems with loss of bowel control? N  Managing your Medications? N  Managing your Finances?  N  Housekeeping or managing your Housekeeping? N  Some recent data might be hidden    Patient Care Team: Einar Pheasant, MD as PCP - General (Internal Medicine)  Indicate any recent Medical Services you may have received from other than [facility] providers in the past year (date may be approximate).     Assessment:   This is a routine wellness examination for [facility].  I connected with Roanna today by telephone and verified that I am speaking with the correct person using two identifiers. Location patient: home Location provider: work Persons participating in the virtual visit: patient, Marine scientist.    I discussed the limitations, risks, security and privacy concerns of performing an evaluation and management service by telephone and the availability of in person appointments. The patient expressed understanding and verbally consented to this telephonic visit.    Interactive audio and video telecommunications were attempted between this provider and patient, however failed, due to patient having technical difficulties OR patient did not have access to video capability.  We continued and completed visit with audio only.  Some vital signs may be absent or patient reported.   Hearing/Vision screen  Hearing Screening   125Hz  250Hz  500Hz  1000Hz  2000Hz  3000Hz  4000Hz  6000Hz  8000Hz   Right ear:             Left ear:           Comments: Patient is able to hear conversational tones without difficulty.  No issues reported.  Vision Screening Comments: Followed by [facility] Wears corrective lenses Cataract extraction, bilateral Glaucoma; drops in use Visual acuity not assessed, virtual visit.  They have seen their ophthalmologist in the last 6 months.    Dietary issues and exercise activities discussed: Current Exercise Habits: Home exercise routine, Intensity: Mild  Healthy diet Ensure/Boost instead of skipping a meal Good water intake 1 mug of coffee   Goals      Patient Stated   .  Increase physical activity (pt-stated)      Walk the treadmill 15 minutes in the morning, 15 minutes in the evening      Depression Screen PHQ 2/9 Scores 11/23/2019 12/21/2018 10/23/2016 05/05/2016 01/31/2016 10/29/2015 10/01/2015  PHQ - 2 Score 0 0 0 0 0 0 0  PHQ- 9 Score - - 0 - - - -    Fall Risk Fall Risk  11/23/2019 12/21/2018 03/05/2018 11/20/2017 05/05/2016  Falls in the past year? 1 1 0 Yes No  Comment - - Emmi Telephone Survey: data to providers prior to load Emmi Telephone Survey: data to providers prior to load -  Number falls in past yr: 0 0 - 2 or more -  Comment missed a step and caught herself on her knee. No injury. - - Emmi Telephone Survey Actual Response = 2 -  Injury with Fall? - 1 - No -  Risk for fall due to : - History of fall(s);Impaired vision - - -  Follow up Falls evaluation completed Falls evaluation completed - - -   Handrails in use when climbing stairs? Yes  Home free of loose throw rugs in walkways, pet beds, electrical cords, etc? Yes  Adequate lighting in your home to reduce risk of falls? Yes   ASSISTIVE DEVICES UTILIZED TO PREVENT FALLS:  Life alert? Yes  Use of a cane, walker or w/c? No  Grab bars in the bathroom? Yes  Shower chair or bench in shower? No  Elevated toilet seat or a handicapped toilet? Yes   TIMED  UP AND GO:  Was the test performed? No .  Virtual visit.  Cognitive Function: MMSE - Mini Mental State Exam 10/28/2016 10/29/2015  Orientation to time 5 5  Orientation to Place 5 5  Registration 3 3  Attention/ Calculation 5 5  Recall 3 3  Language- name 2 objects 2 2  Language- repeat 1 1  Language- follow 3 step command 3 3  Language- read & follow direction 1 1  Write a sentence 1 1  Copy design 1 1  Total score 30 30     6CIT Screen 11/23/2019  What Year? 0 points  What month? 0 points  What time? 0 points  Months in reverse 0 points    Immunizations Immunization History  Administered Date(s) Administered  . Fluad Quad(high Dose 65+) 12/21/2018  . Influenza Split 01/18/2012  . Influenza, High Dose Seasonal PF 01/31/2016, 01/20/2017, 01/05/2018  . Influenza,inj,Quad PF,6+ Mos 01/12/2013, 12/14/2013, 01/09/2015  . Pneumococcal Conjugate-13 05/17/2013  . Pneumococcal Polysaccharide-23 01/29/2015  . Tdap 01/17/2013  . Zoster 03/26/2006    Health Maintenance Health Maintenance  Topic Date Due  . URINE MICROALBUMIN  07/07/2019  . INFLUENZA VACCINE  11/20/2019  . COVID-19 Vaccine (1) 12/09/2019 (Originally 10/07/1941)  . FOOT EXAM  12/21/2019  . MAMMOGRAM  01/10/2020  . HEMOGLOBIN A1C  02/22/2020  . OPHTHALMOLOGY EXAM  07/03/2020  . TETANUS/TDAP  01/18/2023  . DEXA SCAN  Completed  . PNA vac Low Risk Adult  Completed    Dental Screening: Recommended annual dental exams for proper oral hygiene  Community Resource Referral / Chronic Care Management: CRR required this visit?  No   CCM required this visit?  No      Plan:   Keep all routine maintenance appointments.   Next scheduled lab 01/05/20   Follow up 01/09/20 @ 10:30  I have personally reviewed and noted the following in the patient's chart:   . Medical and social history . Use of alcohol, tobacco or illicit drugs  . Current medications and supplements . Functional ability and status . Nutritional status . Physical activity . Advanced  directives . List of other physicians . Hospitalizations, surgeries, and ER visits in previous 12 months . Vitals . Screenings to include cognitive, depression, and falls . Referrals and appointments  In addition, I have reviewed and discussed with patient certain preventive protocols, quality metrics, and best practice recommendations. A written personalized care plan for preventive services as well as general preventive health recommendations were provided to patient via mychart.     Varney Biles, LPN   12/24/7094     Reviewed above information.  Agree with assessment and plan.    Dr Nicki Reaper

## 2019-11-29 ENCOUNTER — Other Ambulatory Visit: Payer: Self-pay | Admitting: Internal Medicine

## 2019-11-29 DIAGNOSIS — Z1231 Encounter for screening mammogram for malignant neoplasm of breast: Secondary | ICD-10-CM

## 2019-12-09 DIAGNOSIS — H401133 Primary open-angle glaucoma, bilateral, severe stage: Secondary | ICD-10-CM | POA: Diagnosis not present

## 2019-12-29 DIAGNOSIS — L821 Other seborrheic keratosis: Secondary | ICD-10-CM | POA: Diagnosis not present

## 2019-12-29 DIAGNOSIS — L578 Other skin changes due to chronic exposure to nonionizing radiation: Secondary | ICD-10-CM | POA: Diagnosis not present

## 2019-12-29 DIAGNOSIS — Z872 Personal history of diseases of the skin and subcutaneous tissue: Secondary | ICD-10-CM | POA: Diagnosis not present

## 2019-12-29 DIAGNOSIS — L570 Actinic keratosis: Secondary | ICD-10-CM | POA: Diagnosis not present

## 2020-01-05 ENCOUNTER — Other Ambulatory Visit (INDEPENDENT_AMBULATORY_CARE_PROVIDER_SITE_OTHER): Payer: Medicare HMO

## 2020-01-05 ENCOUNTER — Other Ambulatory Visit: Payer: Self-pay

## 2020-01-05 DIAGNOSIS — E7800 Pure hypercholesterolemia, unspecified: Secondary | ICD-10-CM

## 2020-01-05 DIAGNOSIS — E039 Hypothyroidism, unspecified: Secondary | ICD-10-CM | POA: Diagnosis not present

## 2020-01-05 DIAGNOSIS — I10 Essential (primary) hypertension: Secondary | ICD-10-CM

## 2020-01-05 DIAGNOSIS — E1142 Type 2 diabetes mellitus with diabetic polyneuropathy: Secondary | ICD-10-CM | POA: Diagnosis not present

## 2020-01-05 LAB — CBC WITH DIFFERENTIAL/PLATELET
Basophils Absolute: 0.1 10*3/uL (ref 0.0–0.1)
Basophils Relative: 0.7 % (ref 0.0–3.0)
Eosinophils Absolute: 0.1 10*3/uL (ref 0.0–0.7)
Eosinophils Relative: 1.5 % (ref 0.0–5.0)
HCT: 41.8 % (ref 36.0–46.0)
Hemoglobin: 13.9 g/dL (ref 12.0–15.0)
Lymphocytes Relative: 24.6 % (ref 12.0–46.0)
Lymphs Abs: 1.8 10*3/uL (ref 0.7–4.0)
MCHC: 33.3 g/dL (ref 30.0–36.0)
MCV: 94.3 fl (ref 78.0–100.0)
Monocytes Absolute: 0.6 10*3/uL (ref 0.1–1.0)
Monocytes Relative: 8.6 % (ref 3.0–12.0)
Neutro Abs: 4.7 10*3/uL (ref 1.4–7.7)
Neutrophils Relative %: 64.6 % (ref 43.0–77.0)
Platelets: 215 10*3/uL (ref 150.0–400.0)
RBC: 4.43 Mil/uL (ref 3.87–5.11)
RDW: 14 % (ref 11.5–15.5)
WBC: 7.2 10*3/uL (ref 4.0–10.5)

## 2020-01-05 LAB — BASIC METABOLIC PANEL
BUN: 15 mg/dL (ref 6–23)
CO2: 31 mEq/L (ref 19–32)
Calcium: 10 mg/dL (ref 8.4–10.5)
Chloride: 104 mEq/L (ref 96–112)
Creatinine, Ser: 0.92 mg/dL (ref 0.40–1.20)
GFR: 57.58 mL/min — ABNORMAL LOW (ref >60.00)
Glucose, Bld: 104 mg/dL — ABNORMAL HIGH (ref 70–99)
Potassium: 4.6 mEq/L (ref 3.5–5.1)
Sodium: 140 mEq/L (ref 135–145)

## 2020-01-05 LAB — TSH: TSH: 2.13 u[IU]/mL (ref 0.35–4.50)

## 2020-01-05 LAB — HEPATIC FUNCTION PANEL
ALT: 12 U/L (ref 0–35)
AST: 15 U/L (ref 0–37)
Albumin: 4.4 g/dL (ref 3.5–5.2)
Alkaline Phosphatase: 50 U/L (ref 39–117)
Bilirubin, Direct: 0.1 mg/dL (ref 0.0–0.3)
Total Bilirubin: 0.7 mg/dL (ref 0.2–1.2)
Total Protein: 7.5 g/dL (ref 6.0–8.3)

## 2020-01-05 LAB — HEMOGLOBIN A1C: Hgb A1c MFr Bld: 6.7 % — ABNORMAL HIGH (ref 4.6–6.5)

## 2020-01-05 LAB — LIPID PANEL
Cholesterol: 163 mg/dL (ref 0–200)
HDL: 72.9 mg/dL (ref >39.00)
LDL Cholesterol: 75 mg/dL (ref 0–99)
NonHDL: 89.88
Total CHOL/HDL Ratio: 2
Triglycerides: 72 mg/dL (ref 0.0–149.0)
VLDL: 14.4 mg/dL (ref 0.0–40.0)

## 2020-01-05 LAB — MICROALBUMIN / CREATININE URINE RATIO
Creatinine,U: 42.9 mg/dL
Microalb Creat Ratio: 1.6 mg/g (ref 0.0–30.0)
Microalb, Ur: <0.7 mg/dL (ref 0.0–1.9)

## 2020-01-09 ENCOUNTER — Other Ambulatory Visit: Payer: Self-pay

## 2020-01-09 ENCOUNTER — Ambulatory Visit (INDEPENDENT_AMBULATORY_CARE_PROVIDER_SITE_OTHER): Payer: Medicare HMO | Admitting: Internal Medicine

## 2020-01-09 DIAGNOSIS — E7800 Pure hypercholesterolemia, unspecified: Secondary | ICD-10-CM

## 2020-01-09 DIAGNOSIS — E1142 Type 2 diabetes mellitus with diabetic polyneuropathy: Secondary | ICD-10-CM | POA: Diagnosis not present

## 2020-01-09 DIAGNOSIS — F439 Reaction to severe stress, unspecified: Secondary | ICD-10-CM

## 2020-01-09 DIAGNOSIS — E538 Deficiency of other specified B group vitamins: Secondary | ICD-10-CM

## 2020-01-09 DIAGNOSIS — E039 Hypothyroidism, unspecified: Secondary | ICD-10-CM

## 2020-01-09 DIAGNOSIS — Z23 Encounter for immunization: Secondary | ICD-10-CM | POA: Diagnosis not present

## 2020-01-09 DIAGNOSIS — I10 Essential (primary) hypertension: Secondary | ICD-10-CM | POA: Diagnosis not present

## 2020-01-09 DIAGNOSIS — R1310 Dysphagia, unspecified: Secondary | ICD-10-CM

## 2020-01-09 DIAGNOSIS — K2100 Gastro-esophageal reflux disease with esophagitis, without bleeding: Secondary | ICD-10-CM | POA: Diagnosis not present

## 2020-01-09 DIAGNOSIS — G6289 Other specified polyneuropathies: Secondary | ICD-10-CM | POA: Diagnosis not present

## 2020-01-09 DIAGNOSIS — Z853 Personal history of malignant neoplasm of breast: Secondary | ICD-10-CM

## 2020-01-09 NOTE — Progress Notes (Signed)
Patient ID: Julie Hoover, female   DOB: 1929/09/26, 86 y.o.   MRN: [id number]   Subjective:    Patient ID: Julie Hoover, female    DOB: [birth year], 86 y.o.   MRN: [id number]  HPI This visit occurred during the SARS-CoV-2 public health emergency.  Safety protocols were in place, including screening questions prior to the visit, additional usage of staff PPE, and extensive cleaning of exam room while observing appropriate contact time as indicated for disinfecting solutions.  Patient here for a scheduled follow up. She is doing well.  Stays active.  No chest pain or sob with increased activity or exertion.  No acid reflux.  No abdominal pain.  Bowels moving.  Outside sugar readings reviewed.  AM sugars 90s.  Handling stress.  Has good support.  Has noticed some swallowing issues - dysphagia.  Persistent.  Discussed referral to GI.  No acid reflux.  On prilosec.    Past Medical History:  Diagnosis Date  . Arthritis   . Breast cancer ([place]) 1986   s/p right lumpectomy, XRT and Tamoxifen, mastectomy  . Cancer ([place])    skin ca  . Chicken pox   . Colon polyps   . Diabetes ([place])   . Diverticulosis   . Environmental allergies   . GERD (gastroesophageal reflux disease)   . Glaucoma   . Hypercholesterolemia   . Hyperglycemia   . Hypertension   . Hypothyroidism   . Osteopenia   . Peripheral neuropathy   . Personal history of radiation therapy 1986  . Urethral stricture    Past Surgical History:  Procedure Laterality Date  . BREAST BIOPSY Right 6/86   lumpectomy-positive/rad  . BREAST LUMPECTOMY  1986   right  . ESOPHAGOGASTRODUODENOSCOPY (EGD) WITH PROPOFOL N/A 12/17/2015   Procedure: ESOPHAGOGASTRODUODENOSCOPY (EGD) WITH PROPOFOL;  Surgeon: Lollie Sails, MD;  Location: [facility] ENDOSCOPY;  Service: Endoscopy;  Laterality: N/A;  . EYE SURGERY     Bilateral cataracts removed  . KNEE SURGERY  06/2008   torn meniscus, right  . KNEE SURGERY  2012   torn menisucs, right   . MASTECTOMY Right 1993   right, recurrence  . TUBAL LIGATION    . URETHRAL DILATION     Family History  Problem Relation Age of Onset  . Heart disease Father   . Hypertension Father   . COPD Mother   . Hypertension Brother        x2  . Hypercholesterolemia Brother        x2  . Hypertension Sister   . Hypercholesterolemia Sister   . Diabetes Mellitus II Sister   . Cancer Other        mouth, aunt  . Colon cancer Neg Hx   . Breast cancer Neg Hx    Social History   Socioeconomic History  . Marital status: Widowed    Spouse name: Not on file  . Number of children: 2  . Years of education: Not on file  . Highest education level: Not on file  Occupational History  . Not on file  Tobacco Use  . Smoking status: Never Smoker  . Smokeless tobacco: Never Used  Substance and Sexual Activity  . Alcohol use: No    Alcohol/week: 0.0 standard drinks  . Drug use: No  . Sexual activity: Never  Other Topics Concern  . Not on file  Social History Narrative  . Not on file   Social Determinants of Health   Financial Resource Strain: Low Risk   .  Difficulty of Paying Living Expenses: Not hard at all  Food Insecurity: No Food Insecurity  . Worried About Charity fundraiser in the Last Year: Never true  . Ran Out of Food in the Last Year: Never true  Transportation Needs: No Transportation Needs  . Lack of Transportation (Medical): No  . Lack of Transportation (Non-Medical): No  Physical Activity:   . Days of Exercise per Week: Not on file  . Minutes of Exercise per Session: Not on file  Stress: No Stress Concern Present  . Feeling of Stress : Not at all  Social Connections: Unknown  . Frequency of Communication with Friends and Family: More than three times a week  . Frequency of Social Gatherings with Friends and Family: More than three times a week  . Attends Religious Services: Not on file  . Active Member of Clubs or Organizations: Not on file  . Attends Theatre manager Meetings: Not on file  . Marital Status: Widowed    Outpatient Encounter Medications as of 01/09/2020  Medication Sig  . aspirin 81 MG tablet Take 81 mg by mouth daily.  . Blood Glucose Monitoring Suppl (ACCU-CHEK AVIVA) device Use as instructed  . Calcium Carbonate-Vitamin D 600-400 MG-UNIT chew tablet Chew 1 tablet by mouth 2 (two) times daily.  . cetirizine (ZYRTEC) 10 MG tablet Take 10 mg by mouth daily.  . Chromium-Cinnamon (CINNAMON PLUS CHROMIUM PO) Take by mouth.  . dorzolamide-timolol (COSOPT) 22.3-6.8 MG/ML ophthalmic solution Place 1 drop into the left eye 2 (two) times daily.   Marland Kitchen glucose blood (ACCU-CHEK AVIVA PLUS) test strip Use as instructed: To Check Blood sugar Once daily  DX E11.9  . Lancets (ACCU-CHEK MULTICLIX) lancets Use as instructed: To check blood sugar Once daily. E11.9  . latanoprost (XALATAN) 0.005 % ophthalmic solution Place 1 drop into the left eye at bedtime.  Marland Kitchen levothyroxine (SYNTHROID) 50 MCG tablet TAKE 1 TABLET EVERY DAY  . omeprazole (PRILOSEC) 20 MG capsule TAKE 1 CAPSULE TWICE DAILY  . rosuvastatin (CRESTOR) 10 MG tablet TAKE 1 TABLET EVERY DAY  . temazepam (RESTORIL) 30 MG capsule Take 1 capsule (30 mg total) by mouth at bedtime as needed.  . [DISCONTINUED] amLODipine (NORVASC) 5 MG tablet TAKE 1 TABLET EVERY DAY  . [DISCONTINUED] gabapentin (NEURONTIN) 100 MG capsule Take 1 capsule every morning and take 2 capsules at bedtime.   No facility-administered encounter medications on file as of 01/09/2020.    Review of Systems  Constitutional: Negative for appetite change and unexpected weight change.  HENT: Negative for congestion and sinus pressure.   Respiratory: Negative for cough, chest tightness and shortness of breath.   Cardiovascular: Negative for chest pain, palpitations and leg swelling.  Gastrointestinal: Negative for abdominal pain, diarrhea, nausea and vomiting.       Dysphagia  Genitourinary: Negative for difficulty urinating  and dysuria.  Musculoskeletal: Negative for joint swelling and myalgias.  Skin: Negative for color change and rash.  Neurological: Negative for dizziness, light-headedness and headaches.  Psychiatric/Behavioral: Negative for agitation and dysphoric mood.       Objective:    Physical Exam Vitals reviewed.  Constitutional:      General: She is not in acute distress.    Appearance: Normal appearance.  HENT:     Head: Normocephalic and atraumatic.     Right Ear: External ear normal.     Left Ear: External ear normal.  Eyes:     General: No scleral icterus.  Right eye: No discharge.        Left eye: No discharge.     Conjunctiva/sclera: Conjunctivae normal.  Neck:     Thyroid: No thyromegaly.  Cardiovascular:     Rate and Rhythm: Normal rate and regular rhythm.  Pulmonary:     Effort: No respiratory distress.     Breath sounds: Normal breath sounds. No wheezing.  Abdominal:     General: Bowel sounds are normal.     Palpations: Abdomen is soft.     Tenderness: There is no abdominal tenderness.  Musculoskeletal:        General: No swelling or tenderness.     Cervical back: Neck supple. No tenderness.  Lymphadenopathy:     Cervical: No cervical adenopathy.  Skin:    Findings: No erythema or rash.  Neurological:     Mental Status: She is alert.  Psychiatric:        Mood and Affect: Mood normal.        Behavior: Behavior normal.     BP 118/62   Pulse 62   Temp 98.2 F (36.8 C) (Oral)   Resp 16   Ht $R'5\' 5"'tm$  (1.651 m)   Wt 139 lb 9.6 oz (63.3 kg)   SpO2 98%   BMI 23.23 kg/m  Wt Readings from Last 3 Encounters:  01/09/20 139 lb 9.6 oz (63.3 kg)  11/23/19 138 lb (62.6 kg)  09/07/19 143 lb (64.9 kg)     Lab Results  Component Value Date   WBC 7.2 01/05/2020   HGB 13.9 01/05/2020   HCT 41.8 01/05/2020   PLT 215.0 01/05/2020   GLUCOSE 104 (H) 01/05/2020   CHOL 163 01/05/2020   TRIG 72.0 01/05/2020   HDL 72.90 01/05/2020   LDLCALC 75 01/05/2020   ALT 12  01/05/2020   AST 15 01/05/2020   NA 140 01/05/2020   K 4.6 01/05/2020   CL 104 01/05/2020   CREATININE 0.92 01/05/2020   BUN 15 01/05/2020   CO2 31 01/05/2020   TSH 2.13 01/05/2020   HGBA1C 6.7 (H) 01/05/2020   MICROALBUR <0.7 01/05/2020    DG Bone Density  Result Date: 01/10/2019 EXAM: DUAL X-RAY ABSORPTIOMETRY (DXA) FOR BONE MINERAL DENSITY IMPRESSION: crr Your patient Ashlye Oviedo completed a BMD test on 01/10/2019 using the [place] (analysis version: 14.10) manufactured by [employer]. The following summarizes the results of our evaluation. PATIENT BIOGRAPHICAL: Name: Makaria, Poarch Patient ID: [id number] Birth Date: 1929/10/28 Height: 64.0 in. Gender: Female Exam Date: 01/10/2019 Weight: 144.6 lbs. Indications: Advanced Age, arthritis, Caucasian, diabetic, Height Loss, History of Fracture (Adult), hx breast ca, POSTmenopausal Fractures: rt foot Treatments: 81 MG ASPIRIN, calcium /vit d, GABAPENTIN, levothyroxine ASSESSMENT: The BMD measured at Femur Neck Left is 0.872 g/cm2 with a T-score of -1.2. This patient is considered osteopenic according to [facility]) criteria.L-1 was excluded due to degenerative changes.The scan quality is good. Site Region Measured Measured WHO Young Adult BMD Date       Age      Classification T-score AP Spine L2-L4 01/10/2019 87.2 Normal -0.5 1.141 g/cm2 DualFemur Neck Left 01/10/2019 87.2 Osteopenia -1.2 0.872 g/cm2 DualFemur Neck Left 11/01/2013 82.0 Normal -0.7 0.943 g/cm2 DualFemur Total Mean 01/10/2019 87.2 Normal -0.5 0.942 g/cm2 DualFemur Total Mean 11/01/2013 82.0 Normal -0.4 0.953 g/cm2 World Health [facility]) criteria for post-menopausal, Caucasian Women: Normal:       T-score at or above -1 SD Osteopenia:   T-score between -1 and -  2.5 SD Osteoporosis: T-score at or below -2.5 SD RECOMMENDATIONS: 1. All patients should optimize calcium and vitamin D intake. 2. Consider FDA-approved medical therapies in  postmenopausal women and men aged 94 years and older, based on the following: a. A hip or vertebral (clinical or morphometric) fracture b. T-score < -2.5 at the femoral neck or spine after appropriate evaluation to exclude secondary causes c. Low bone mass (T-score between -1.0 and -2.5 at the femoral neck or spine) and a 10-year probability of a hip fracture > 3% or a 10-year probability of a major osteoporosis-related fracture > 20% based on the [place]-adapted WHO algorithm d. Clinician judgment and/or patient preferences may indicate treatment for people with 10-year fracture probabilities above or below these levels FOLLOW-UP: People with diagnosed cases of osteoporosis or at high risk for fracture should have regular bone mineral density tests. For patients eligible for Medicare, routine testing is allowed once every 2 years. The testing frequency can be increased to one year for patients who have rapidly progressing disease, those who are receiving or discontinuing medical therapy to restore bone mass, or have additional risk factors. I have reviewed this report, and agree with the above findings. [facility] crr Your patient DYNESHIA BACCAM completed a FRAX assessment on 01/10/2019 using the [place] (analysis version: 14.10) manufactured by [employer]. The following summarizes the results of our evaluation. PATIENT BIOGRAPHICAL: Name: Yadhira, Mckneely Patient ID: [id number] Birth Date: Nov 26, 1929 Height:    64.0 in. Gender:     Female    [age]       Weight:    144.6 lbs. Ethnicity:  White                            Exam Date: 01/10/2019 FRAX* RESULTS:  (version: 3.5) 10-year Probability of Fracture1 Major Osteoporotic Fracture2 Hip Fracture 16.5% 4.0% Population: [place] (Caucasian) Risk Factors: History of Fracture (Adult) Based on Femur (Left) Neck BMD 1 -The 10-year probability of fracture may be lower than reported if the patient has received treatment. 2 -Major  Osteoporotic Fracture: Clinical Spine, Forearm, Hip or Shoulder *FRAX is a Materials engineer of the [employer] for Metabolic Bone Disease, a [place] (WHO) [facility]. ASSESSMENT: The probability of a major osteoporotic fracture is 16.5 within the next ten years. The probability of a hip fracture is 4.0 within the next ten years. Electronically Signed   By: Lowella Grip III M.D.   On: 01/10/2019 14:55   MM 3D SCREEN BREAST UNI LEFT  Result Date: 01/10/2019 CLINICAL DATA:  Screening. EXAM: DIGITAL SCREENING UNILATERAL LEFT MAMMOGRAM WITH CAD AND TOMO COMPARISON:  Previous exam(s). ACR Breast Density Category b: There are scattered areas of fibroglandular density. FINDINGS: There are no findings suspicious for malignancy. Images were processed with CAD. IMPRESSION: No mammographic evidence of malignancy. A result letter of this screening mammogram will be mailed directly to the patient. RECOMMENDATION: Screening mammogram in one year. (Code:SM-B-01Y) BI-RADS CATEGORY  1: Negative. Electronically Signed   By: Lovey Newcomer M.D.   On: 01/10/2019 15:27       Assessment & Plan:   Problem List Items Addressed This Visit    Type 2 diabetes mellitus with diabetic polyneuropathy, without long-term current use of insulin ([place])    Outside sugar readings reviewed and look good.  Continue low carb diet and exercise.  On no medication.  Follow met b and a1c.   Lab Results  Component Value Date   HGBA1C 6.7 (H) 01/05/2020        Relevant Orders   Hemoglobin A1c   Stress    Has good support.  Stable.  Had restoril if needed.  Follow.        Peripheral neuropathy    On gabapentin.  Stable.       Hypothyroidism    On thyroid replacement.  Follow tsh.       Hypertension    Blood pressure doing well on current regimen.  Follow pressures.  Continue amlodipine.  Follow metabolic panel.       Relevant Orders   Basic metabolic panel    Hypercholesterolemia    On crestor.  Low cholesterol diet and exercise.  Follow lipid panel and liver function tests.        Relevant Orders   Hepatic function panel   Lipid panel   History of breast cancer    Mammogram 01/10/19 - Birads I.  Scheduled f/u f/u mammogram.        GERD (gastroesophageal reflux disease)    No acid reflux.  Does report persistent dysphagia.  Refer to GI for further evaluation and treatment.        Dysphagia    Dysphagia as outlined.  Refer to GI for further evaluation.        Relevant Orders   Ambulatory referral to Gastroenterology   B12 deficiency    Has been receiving B12 injections.  Check B12 level.  If ok, change to oral B12.        Other Visit Diagnoses    Need for immunization against influenza       Relevant Orders   Flu Vaccine QUAD High Dose(Fluad) (Completed)       Einar Pheasant, MD

## 2020-01-11 ENCOUNTER — Other Ambulatory Visit: Payer: Self-pay

## 2020-01-11 ENCOUNTER — Ambulatory Visit
Admission: RE | Admit: 2020-01-11 | Discharge: 2020-01-11 | Disposition: A | Discharge status: Home / Self Care | Payer: Medicare HMO | Source: Ambulatory Visit | Attending: Internal Medicine | Admitting: Internal Medicine

## 2020-01-11 DIAGNOSIS — Z1231 Encounter for screening mammogram for malignant neoplasm of breast: Secondary | ICD-10-CM | POA: Diagnosis not present

## 2020-01-13 ENCOUNTER — Other Ambulatory Visit: Payer: Self-pay | Admitting: Internal Medicine

## 2020-01-15 ENCOUNTER — Encounter: Payer: Self-pay | Admitting: Internal Medicine

## 2020-01-15 DIAGNOSIS — R1310 Dysphagia, unspecified: Secondary | ICD-10-CM | POA: Insufficient documentation

## 2020-01-15 NOTE — Assessment & Plan Note (Signed)
Has good support.  Stable.  Had restoril if needed.  Follow.

## 2020-01-15 NOTE — Assessment & Plan Note (Signed)
On thyroid replacement.  Follow tsh.  

## 2020-01-15 NOTE — Assessment & Plan Note (Signed)
On crestor.  Low cholesterol diet and exercise.  Follow lipid panel and liver function tests.   

## 2020-01-15 NOTE — Assessment & Plan Note (Signed)
Has been receiving B12 injections.  Check B12 level.  If ok, change to oral B12.

## 2020-01-15 NOTE — Assessment & Plan Note (Signed)
No acid reflux.  Does report persistent dysphagia.  Refer to GI for further evaluation and treatment.

## 2020-01-15 NOTE — Assessment & Plan Note (Signed)
Mammogram 01/10/19 - Birads I.  Scheduled f/u f/u mammogram.

## 2020-01-15 NOTE — Assessment & Plan Note (Signed)
Dysphagia as outlined.  Refer to GI for further evaluation.

## 2020-01-15 NOTE — Assessment & Plan Note (Signed)
Blood pressure doing well on current regimen.  Follow pressures.  Continue amlodipine.  Follow metabolic panel.

## 2020-01-15 NOTE — Assessment & Plan Note (Signed)
Outside sugar readings reviewed and look good.  Continue low carb diet and exercise.  On no medication.  Follow met b and a1c.   Lab Results  Component Value Date   HGBA1C 6.7 (H) 01/05/2020

## 2020-01-15 NOTE — Assessment & Plan Note (Signed)
On gabapentin.  Stable.  

## 2020-01-16 ENCOUNTER — Telehealth: Payer: Self-pay | Admitting: Internal Medicine

## 2020-01-16 NOTE — Telephone Encounter (Signed)
Pt needs a refill on temazepam (RESTORIL) 30 MG capsule sent to [facility]

## 2020-01-18 MED ORDER — TEMAZEPAM 30 MG PO CAPS
30.0000 mg | ORAL_CAPSULE | Freq: Every evening | ORAL | 0 refills | Status: DC | PRN
Start: 2020-01-18 — End: 2020-07-12

## 2020-01-18 NOTE — Addendum Note (Signed)
Addended by: Alisa Graff on: 01/18/2020 05:55 AM   Modules accepted: Orders

## 2020-01-18 NOTE — Telephone Encounter (Signed)
rx ok'd for temazepam #30 with no refills.

## 2020-01-25 ENCOUNTER — Other Ambulatory Visit: Payer: Self-pay | Admitting: Internal Medicine

## 2020-02-19 ENCOUNTER — Other Ambulatory Visit: Payer: Self-pay | Admitting: Internal Medicine

## 2020-03-10 ENCOUNTER — Other Ambulatory Visit: Payer: Self-pay | Admitting: Internal Medicine

## 2020-03-10 DIAGNOSIS — E1142 Type 2 diabetes mellitus with diabetic polyneuropathy: Secondary | ICD-10-CM

## 2020-03-19 ENCOUNTER — Other Ambulatory Visit: Payer: Self-pay | Admitting: Gastroenterology

## 2020-03-19 ENCOUNTER — Other Ambulatory Visit (HOSPITAL_COMMUNITY): Payer: Self-pay | Admitting: Gastroenterology

## 2020-03-19 DIAGNOSIS — R1313 Dysphagia, pharyngeal phase: Secondary | ICD-10-CM | POA: Diagnosis not present

## 2020-03-23 ENCOUNTER — Other Ambulatory Visit: Payer: Medicare HMO

## 2020-03-27 ENCOUNTER — Ambulatory Visit
Admission: RE | Admit: 2020-03-27 | Discharge: 2020-03-27 | Disposition: A | Discharge status: Home / Self Care | Payer: Medicare HMO | Source: Ambulatory Visit | Attending: Gastroenterology | Admitting: Gastroenterology

## 2020-03-27 ENCOUNTER — Other Ambulatory Visit: Payer: Self-pay

## 2020-03-27 DIAGNOSIS — R1313 Dysphagia, pharyngeal phase: Secondary | ICD-10-CM | POA: Insufficient documentation

## 2020-03-27 DIAGNOSIS — K449 Diaphragmatic hernia without obstruction or gangrene: Secondary | ICD-10-CM | POA: Diagnosis not present

## 2020-03-27 DIAGNOSIS — K5700 Diverticulitis of small intestine with perforation and abscess without bleeding: Secondary | ICD-10-CM | POA: Diagnosis not present

## 2020-03-27 DIAGNOSIS — R1310 Dysphagia, unspecified: Secondary | ICD-10-CM | POA: Diagnosis not present

## 2020-05-02 DIAGNOSIS — K225 Diverticulum of esophagus, acquired: Secondary | ICD-10-CM | POA: Diagnosis not present

## 2020-05-02 DIAGNOSIS — Z87898 Personal history of other specified conditions: Secondary | ICD-10-CM | POA: Diagnosis not present

## 2020-05-02 DIAGNOSIS — K2289 Other specified disease of esophagus: Secondary | ICD-10-CM | POA: Diagnosis not present

## 2020-05-10 ENCOUNTER — Other Ambulatory Visit: Payer: Medicare HMO

## 2020-05-14 ENCOUNTER — Encounter: Payer: Medicare HMO | Admitting: Internal Medicine

## 2020-05-29 ENCOUNTER — Other Ambulatory Visit (INDEPENDENT_AMBULATORY_CARE_PROVIDER_SITE_OTHER): Payer: Medicare HMO

## 2020-05-29 ENCOUNTER — Other Ambulatory Visit: Payer: Self-pay

## 2020-05-29 DIAGNOSIS — E1142 Type 2 diabetes mellitus with diabetic polyneuropathy: Secondary | ICD-10-CM | POA: Diagnosis not present

## 2020-05-29 DIAGNOSIS — E7800 Pure hypercholesterolemia, unspecified: Secondary | ICD-10-CM | POA: Diagnosis not present

## 2020-05-29 DIAGNOSIS — I10 Essential (primary) hypertension: Secondary | ICD-10-CM | POA: Diagnosis not present

## 2020-05-29 LAB — HEPATIC FUNCTION PANEL
ALT: 12 U/L (ref 0–35)
AST: 15 U/L (ref 0–37)
Albumin: 4.4 g/dL (ref 3.5–5.2)
Alkaline Phosphatase: 56 U/L (ref 39–117)
Bilirubin, Direct: 0.1 mg/dL (ref 0.0–0.3)
Total Bilirubin: 0.6 mg/dL (ref 0.2–1.2)
Total Protein: 7.1 g/dL (ref 6.0–8.3)

## 2020-05-29 LAB — LIPID PANEL
Cholesterol: 188 mg/dL (ref 0–200)
HDL: 74.2 mg/dL (ref >39.00)
LDL Cholesterol: 98 mg/dL (ref 0–99)
NonHDL: 114.19
Total CHOL/HDL Ratio: 3
Triglycerides: 82 mg/dL (ref 0.0–149.0)
VLDL: 16.4 mg/dL (ref 0.0–40.0)

## 2020-05-29 LAB — BASIC METABOLIC PANEL
BUN: 15 mg/dL (ref 6–23)
CO2: 32 mEq/L (ref 19–32)
Calcium: 9.8 mg/dL (ref 8.4–10.5)
Chloride: 104 mEq/L (ref 96–112)
Creatinine, Ser: 0.81 mg/dL (ref 0.40–1.20)
GFR: 64.71 mL/min (ref >60.00)
Glucose, Bld: 88 mg/dL (ref 70–99)
Potassium: 4.2 mEq/L (ref 3.5–5.1)
Sodium: 141 mEq/L (ref 135–145)

## 2020-05-29 LAB — HEMOGLOBIN A1C: Hgb A1c MFr Bld: 6.4 % (ref 4.6–6.5)

## 2020-05-31 ENCOUNTER — Ambulatory Visit (INDEPENDENT_AMBULATORY_CARE_PROVIDER_SITE_OTHER): Payer: Medicare HMO | Admitting: Internal Medicine

## 2020-05-31 ENCOUNTER — Other Ambulatory Visit: Payer: Self-pay

## 2020-05-31 VITALS — BP 120/60 | HR 71 | Temp 98.2°F | Resp 16 | Ht 64.0 in | Wt 134.8 lb

## 2020-05-31 DIAGNOSIS — E1142 Type 2 diabetes mellitus with diabetic polyneuropathy: Secondary | ICD-10-CM

## 2020-05-31 DIAGNOSIS — H6123 Impacted cerumen, bilateral: Secondary | ICD-10-CM

## 2020-05-31 DIAGNOSIS — I10 Essential (primary) hypertension: Secondary | ICD-10-CM

## 2020-05-31 DIAGNOSIS — Z0000 Encounter for general adult medical examination without abnormal findings: Secondary | ICD-10-CM | POA: Diagnosis not present

## 2020-05-31 DIAGNOSIS — E039 Hypothyroidism, unspecified: Secondary | ICD-10-CM | POA: Diagnosis not present

## 2020-05-31 DIAGNOSIS — R1310 Dysphagia, unspecified: Secondary | ICD-10-CM

## 2020-05-31 DIAGNOSIS — Z23 Encounter for immunization: Secondary | ICD-10-CM

## 2020-05-31 MED ORDER — DEBROX 6.5 % OT SOLN: OTIC | 0 refills | Status: DC

## 2020-05-31 MED ORDER — ROSUVASTATIN CALCIUM 20 MG PO TABS: 20.0000 mg | ORAL_TABLET | Freq: Every day | ORAL | 1 refills | Status: DC

## 2020-05-31 MED ORDER — ZOSTER VAC RECOMB ADJUVANTED 50 MCG/0.5ML IM SUSR: 0.5000 mL | Freq: Once | INTRAMUSCULAR | 0 refills | Status: AC

## 2020-05-31 NOTE — Assessment & Plan Note (Signed)
Physical today 05/31/20.  S/p right mastectomy.  Mammogram (left) 01/11/20 - Briads I.  Colonoscopy 08/2009.

## 2020-05-31 NOTE — Progress Notes (Signed)
Patient ID: Julie Hoover, female   DOB: 1929-08-03, 87 y.o.   MRN: [id number]   Subjective:    Patient ID: Julie Hoover, female    DOB: Aug 10, 1929, 87 y.o.   MRN: [id number]  HPI This visit occurred during the SARS-CoV-2 public health emergency.  Safety protocols were in place, including screening questions prior to the visit, additional usage of staff PPE, and extensive cleaning of exam room while observing appropriate contact time as indicated for disinfecting solutions.  Patient here for her physical exam. She reports seh is doing relatively well.  Handling stress.  Tries to stay active.  No chest pain or sob reported.  No abdominal pain or bowel change reported.  Had fall 3-4 weeks ago.  [place] in her kitchen.  No LOC.  No syncope or near syncope.  No head injury.  Bruised - left ankle.  Using ice packs. Improved now.  Blood pressures and blood sugars doing well.  Reviewed outside sugar readings.    Past Medical History:  Diagnosis Date  . Arthritis   . Breast cancer ([place]) 1986   s/p right lumpectomy, XRT and Tamoxifen, mastectomy  . Cancer ([place])    skin ca  . Chicken pox   . Colon polyps   . Diabetes ([place])   . Diverticulosis   . Environmental allergies   . GERD (gastroesophageal reflux disease)   . Glaucoma   . Hypercholesterolemia   . Hyperglycemia   . Hypertension   . Hypothyroidism   . Osteopenia   . Peripheral neuropathy   . Personal history of radiation therapy 1986  . Urethral stricture    Past Surgical History:  Procedure Laterality Date  . BREAST BIOPSY Right 6/86   lumpectomy-positive/rad  . BREAST LUMPECTOMY  1986   right  . ESOPHAGOGASTRODUODENOSCOPY (EGD) WITH PROPOFOL N/A 12/17/2015   Procedure: ESOPHAGOGASTRODUODENOSCOPY (EGD) WITH PROPOFOL;  Surgeon: Lollie Sails, MD;  Location: [facility] ENDOSCOPY;  Service: Endoscopy;  Laterality: N/A;  . EYE SURGERY     Bilateral cataracts removed  . KNEE SURGERY  06/2008   torn meniscus, right  . KNEE  SURGERY  2012   torn menisucs, right  . MASTECTOMY Right 1993   right, recurrence  . TUBAL LIGATION    . URETHRAL DILATION     Family History  Problem Relation Age of Onset  . Heart disease Father   . Hypertension Father   . COPD Mother   . Hypertension Brother        x2  . Hypercholesterolemia Brother        x2  . Hypertension Sister   . Hypercholesterolemia Sister   . Diabetes Mellitus II Sister   . Cancer Other        mouth, aunt  . Colon cancer Neg Hx   . Breast cancer Neg Hx    Social History   Socioeconomic History  . Marital status: Widowed    Spouse name: Not on file  . Number of children: 2  . Years of education: Not on file  . Highest education level: Not on file  Occupational History  . Not on file  Tobacco Use  . Smoking status: Never Smoker  . Smokeless tobacco: Never Used  Substance and Sexual Activity  . Alcohol use: No    Alcohol/week: 0.0 standard drinks  . Drug use: No  . Sexual activity: Never  Other Topics Concern  . Not on file  Social History Narrative  . Not on file   Social  Determinants of Health   Financial Resource Strain: Low Risk   . Difficulty of Paying Living Expenses: Not hard at all  Food Insecurity: No Food Insecurity  . Worried About Charity fundraiser in the Last Year: Never true  . Ran Out of Food in the Last Year: Never true  Transportation Needs: No Transportation Needs  . Lack of Transportation (Medical): No  . Lack of Transportation (Non-Medical): No  Physical Activity: Not on file  Stress: No Stress Concern Present  . Feeling of Stress : Not at all  Social Connections: Unknown  . Frequency of Communication with Friends and Family: More than three times a week  . Frequency of Social Gatherings with Friends and Family: More than three times a week  . Attends Religious Services: Not on file  . Active Member of Clubs or Organizations: Not on file  . Attends Archivist Meetings: Not on file  . Marital  Status: Widowed    Outpatient Encounter Medications as of 05/31/2020  Medication Sig  . carbamide peroxide (DEBROX) 6.5 % OTIC solution 4-5 drops in both ears daily.  Massage for approximately 5 minutes daily.  . rosuvastatin (CRESTOR) 20 MG tablet Take 1 tablet (20 mg total) by mouth daily.  . [EXPIRED] Zoster Vaccine Adjuvanted [facility]) injection Inject 0.5 mLs into the muscle once for 1 dose.  . Accu-Chek Softclix Lancets lancets USE AS INSTRUCTED TO CHECK BLOOD SUGAR ONCE DAILY  . amLODipine (NORVASC) 5 MG tablet TAKE 1 TABLET EVERY DAY  . aspirin 81 MG tablet Take 81 mg by mouth daily.  . Blood Glucose Monitoring Suppl (ACCU-CHEK AVIVA) device Use as instructed  . Calcium Carbonate-Vitamin D 600-400 MG-UNIT chew tablet Chew 1 tablet by mouth 2 (two) times daily.  . cetirizine (ZYRTEC) 10 MG tablet Take 10 mg by mouth daily.  . Chromium-Cinnamon (CINNAMON PLUS CHROMIUM PO) Take by mouth.  . dorzolamide-timolol (COSOPT) 22.3-6.8 MG/ML ophthalmic solution Place 1 drop into the left eye 2 (two) times daily.   Marland Kitchen gabapentin (NEURONTIN) 100 MG capsule TAKE 1 CAPSULE EVERY MORNING AND TAKE 2 CAPSULES AT BEDTIME.  Marland Kitchen glucose blood (ACCU-CHEK AVIVA PLUS) test strip USE AS INSTRUCTED TO CHECK BLOOD SUGAR ONCE DAILY  . latanoprost (XALATAN) 0.005 % ophthalmic solution Place 1 drop into the left eye at bedtime.  Marland Kitchen levothyroxine (SYNTHROID) 50 MCG tablet TAKE 1 TABLET EVERY DAY  . omeprazole (PRILOSEC) 20 MG capsule TAKE 1 CAPSULE TWICE DAILY  . temazepam (RESTORIL) 30 MG capsule Take 1 capsule (30 mg total) by mouth at bedtime as needed.  . [DISCONTINUED] rosuvastatin (CRESTOR) 10 MG tablet TAKE 1 TABLET EVERY DAY   No facility-administered encounter medications on file as of 05/31/2020.    Review of Systems  Constitutional: Negative for appetite change and unexpected weight change.  HENT: Negative for congestion, sinus pressure and sore throat.   Eyes: Negative for pain and visual disturbance.   Respiratory: Negative for cough, chest tightness and shortness of breath.   Cardiovascular: Negative for chest pain, palpitations and leg swelling.  Gastrointestinal: Negative for abdominal pain, diarrhea, nausea and vomiting.  Genitourinary: Negative for difficulty urinating and dysuria.  Musculoskeletal: Negative for joint swelling and myalgias.  Skin: Negative for color change and rash.  Neurological: Negative for dizziness, light-headedness and headaches.  Hematological: Negative for adenopathy. Does not bruise/bleed easily.  Psychiatric/Behavioral: Negative for agitation and dysphoric mood.       Objective:    Physical Exam Vitals reviewed.  Constitutional:  General: She is not in acute distress.    Appearance: Normal appearance.  HENT:     Head: Normocephalic and atraumatic.     Right Ear: External ear normal.     Left Ear: External ear normal.     Mouth/Throat:     Mouth: Oropharynx is clear and moist.  Eyes:     General: No scleral icterus.       Right eye: No discharge.        Left eye: No discharge.     Conjunctiva/sclera: Conjunctivae normal.  Neck:     Thyroid: No thyromegaly.  Cardiovascular:     Rate and Rhythm: Normal rate and regular rhythm.  Pulmonary:     Effort: No respiratory distress.     Breath sounds: Normal breath sounds. No wheezing.     Comments: Breasts:  S/p right mastectomy.  Left breast - no nipple discharge or nipple retraction present.  No distinct nodules or axillary adenopathy palpable.  Abdominal:     General: Bowel sounds are normal.     Palpations: Abdomen is soft.     Tenderness: There is no abdominal tenderness.  Musculoskeletal:        General: No swelling, tenderness or edema.     Cervical back: Neck supple. No tenderness.  Lymphadenopathy:     Cervical: No cervical adenopathy.  Skin:    Findings: No erythema or rash.  Neurological:     Mental Status: She is alert.  Psychiatric:        Mood and Affect: Mood normal.         Behavior: Behavior normal.     BP 120/60   Pulse 71   Temp 98.2 F (36.8 C) (Oral)   Resp 16   Ht 5' 4" (1.626 m)   Wt 134 lb 12.8 oz (61.1 kg)   SpO2 97%   BMI 23.14 kg/m  Wt Readings from Last 3 Encounters:  05/31/20 134 lb 12.8 oz (61.1 kg)  01/09/20 139 lb 9.6 oz (63.3 kg)  11/23/19 138 lb (62.6 kg)     Lab Results  Component Value Date   WBC 7.2 01/05/2020   HGB 13.9 01/05/2020   HCT 41.8 01/05/2020   PLT 215.0 01/05/2020   GLUCOSE 88 05/29/2020   CHOL 188 05/29/2020   TRIG 82.0 05/29/2020   HDL 74.20 05/29/2020   LDLCALC 98 05/29/2020   ALT 12 05/29/2020   AST 15 05/29/2020   NA 141 05/29/2020   K 4.2 05/29/2020   CL 104 05/29/2020   CREATININE 0.81 05/29/2020   BUN 15 05/29/2020   CO2 32 05/29/2020   TSH 2.13 01/05/2020   HGBA1C 6.4 05/29/2020   MICROALBUR <0.7 01/05/2020    DG ESOPHAGUS W DOUBLE CM (HD)  Result Date: 03/27/2020 CLINICAL DATA:  Dysphagia.  Esophageal diverticulum.  Hiatal hernia. EXAM: ESOPHOGRAM/BARIUM SWALLOW TECHNIQUE: Combined double contrast and single contrast examination performed using effervescent crystals, thick barium liquid, and thin barium liquid. FLUOROSCOPY TIME:  Fluoroscopy Time:  1 minutes 48 seconds Radiation Exposure Index (if provided by the fluoroscopic device): 20.4 mGy COMPARISON:  Barium swallow 11/01/2015. FINDINGS: Zenker's diverticulum is again noted. Diverticulum may be slightly larger on today's exam than on prior study of 11/01/2015. No evidence of aspiration. Cervical esophagus is widely patent. Small mid thoracic esophageal diverticulum again noted. Thoracic esophagus is widely patent. Large hiatal hernia again noted. Given Zenker's diverticulum, barium tablet was not administered. Mild reflux. IMPRESSION: 1. Zenker's diverticulum is again noted. Diverticulum may be  slightly larger on today's exam than on prior study of 11/01/2015. Cervical esophagus is widely patent. 2. Small midthoracic esophageal  diverticulum again noted. Thoracic esophagus is widely patent. Large hiatal hernia again noted. Mild reflux. Electronically Signed   By: Marcello Moores  Register   On: 03/27/2020 11:28       Assessment & Plan:   Problem List Items Addressed This Visit    Cerumen impaction    Bilateral cerumen impaction.  Debrox as directed.  Return for bilateral ear irrigation.       Dysphagia    Dysphagia as outlined previously.  Saw GI.  Zenker's diverticulum.  Desires no surgical intervention.  Follow.       Health care maintenance    Physical today 05/31/20.  S/p right mastectomy.  Mammogram (left) 01/11/20 - Briads I.  Colonoscopy 08/2009.        Hypertension    Blood pressure doing well.  Continue amlodipine.  Follow pressures.  Follow metabolic panel.       Relevant Medications   rosuvastatin (CRESTOR) 20 MG tablet   Other Relevant Orders   Basic metabolic panel   Hypothyroidism    On thyroid replacement.  Follow tsh.       Need for shingles vaccine    Discussed need for shingles vaccine.  rx given.       Type 2 diabetes mellitus with diabetic polyneuropathy, without long-term current use of insulin (HCC)    Outside sugars reviewed.  Doing well.  Continue low carb diet and exercise.  Follow met b and a1c. Lab Results  Component Value Date   HGBA1C 6.4 05/29/2020         Relevant Medications   rosuvastatin (CRESTOR) 20 MG tablet   Other Relevant Orders   Hemoglobin A1c   Hepatic function panel   Lipid panel    Other Visit Diagnoses    Routine general medical examination at a health care facility    -  Primary       Einar Pheasant, MD

## 2020-06-02 ENCOUNTER — Encounter: Payer: Self-pay | Admitting: Internal Medicine

## 2020-06-02 DIAGNOSIS — Z23 Encounter for immunization: Secondary | ICD-10-CM | POA: Insufficient documentation

## 2020-06-02 NOTE — Assessment & Plan Note (Signed)
Discussed need for shingles vaccine.  rx given.

## 2020-06-02 NOTE — Assessment & Plan Note (Addendum)
Outside sugars reviewed.  Doing well.  Continue low carb diet and exercise.  Follow met b and a1c. Lab Results  Component Value Date   HGBA1C 6.4 05/29/2020

## 2020-06-02 NOTE — Assessment & Plan Note (Signed)
Bilateral cerumen impaction.  Debrox as directed.  Return for bilateral ear irrigation.

## 2020-06-02 NOTE — Assessment & Plan Note (Signed)
Blood pressure doing well.  Continue amlodipine.  Follow pressures.  Follow metabolic panel.  

## 2020-06-02 NOTE — Assessment & Plan Note (Signed)
On thyroid replacement.  Follow tsh.  

## 2020-06-02 NOTE — Assessment & Plan Note (Signed)
Dysphagia as outlined previously.  Saw GI.  Zenker's diverticulum.  Desires no surgical intervention.  Follow.

## 2020-06-06 ENCOUNTER — Ambulatory Visit (INDEPENDENT_AMBULATORY_CARE_PROVIDER_SITE_OTHER): Payer: Medicare HMO

## 2020-06-06 ENCOUNTER — Other Ambulatory Visit: Payer: Self-pay

## 2020-06-06 DIAGNOSIS — H6123 Impacted cerumen, bilateral: Secondary | ICD-10-CM | POA: Diagnosis not present

## 2020-06-06 NOTE — Progress Notes (Signed)
Patient presented for ear irrigation due to cerumen impaction. Patient was informed of the possible side effects of having their ear flushed; light headedness, dizziness, nausea, vomiting and rupture ear drum. Items used or that can be used are the elephant pump, catch basin, ear curettes, hydrogen peroxide (half a bottle for ear irrigation solution), and a stool softener (1-2CC for softening ear wax).  Patient has given a verbal consent to have ear irrigation. patient voiced no concerns nor showed any signs of distress during procedure. Ear canal has become visibly clear.

## 2020-06-21 ENCOUNTER — Other Ambulatory Visit: Payer: Self-pay | Admitting: Internal Medicine

## 2020-07-02 ENCOUNTER — Telehealth: Payer: Self-pay | Admitting: Internal Medicine

## 2020-07-02 NOTE — Telephone Encounter (Signed)
In your quick sign for signature

## 2020-07-02 NOTE — Telephone Encounter (Signed)
[facility] called in about order for patient that was faxed in have been faxed in twice

## 2020-07-03 NOTE — Telephone Encounter (Signed)
faxed

## 2020-07-03 NOTE — Telephone Encounter (Signed)
Signed and placed in box.   

## 2020-07-06 DIAGNOSIS — Z4431 Encounter for fitting and adjustment of external right breast prosthesis: Secondary | ICD-10-CM | POA: Diagnosis not present

## 2020-07-06 DIAGNOSIS — C50111 Malignant neoplasm of central portion of right female breast: Secondary | ICD-10-CM | POA: Diagnosis not present

## 2020-07-12 ENCOUNTER — Telehealth: Payer: Self-pay | Admitting: Internal Medicine

## 2020-07-12 MED ORDER — TEMAZEPAM 30 MG PO CAPS: 30.0000 mg | ORAL_CAPSULE | Freq: Every evening | ORAL | 0 refills | Status: DC | PRN

## 2020-07-12 NOTE — Telephone Encounter (Signed)
Patient called in for refill for temazepam (RESTORIL) 30 MG capsule

## 2020-07-12 NOTE — Telephone Encounter (Signed)
rx sent in to [facility] for temazepam.

## 2020-07-16 NOTE — Telephone Encounter (Signed)
Please remove [facility] from pt's chart. Pt only wants to use [facility]

## 2020-08-10 ENCOUNTER — Other Ambulatory Visit: Payer: Self-pay | Admitting: Internal Medicine

## 2020-08-10 DIAGNOSIS — E1142 Type 2 diabetes mellitus with diabetic polyneuropathy: Secondary | ICD-10-CM

## 2020-08-31 DIAGNOSIS — H401133 Primary open-angle glaucoma, bilateral, severe stage: Secondary | ICD-10-CM | POA: Diagnosis not present

## 2020-08-31 LAB — HM DIABETES EYE EXAM

## 2020-09-27 ENCOUNTER — Other Ambulatory Visit: Payer: Self-pay

## 2020-09-27 ENCOUNTER — Other Ambulatory Visit: Payer: Medicare HMO

## 2020-09-27 ENCOUNTER — Other Ambulatory Visit (INDEPENDENT_AMBULATORY_CARE_PROVIDER_SITE_OTHER): Payer: Medicare HMO

## 2020-09-27 DIAGNOSIS — E1142 Type 2 diabetes mellitus with diabetic polyneuropathy: Secondary | ICD-10-CM | POA: Diagnosis not present

## 2020-09-27 DIAGNOSIS — I10 Essential (primary) hypertension: Secondary | ICD-10-CM

## 2020-09-27 LAB — LIPID PANEL
Cholesterol: 182 mg/dL (ref 0–200)
HDL: 73.6 mg/dL (ref >39.00)
LDL Cholesterol: 93 mg/dL (ref 0–99)
NonHDL: 108.23
Total CHOL/HDL Ratio: 2
Triglycerides: 77 mg/dL (ref 0.0–149.0)
VLDL: 15.4 mg/dL (ref 0.0–40.0)

## 2020-09-27 LAB — BASIC METABOLIC PANEL
BUN: 10 mg/dL (ref 6–23)
CO2: 28 mEq/L (ref 19–32)
Calcium: 9.6 mg/dL (ref 8.4–10.5)
Chloride: 104 mEq/L (ref 96–112)
Creatinine, Ser: 0.75 mg/dL (ref 0.40–1.20)
GFR: 70.81 mL/min (ref >60.00)
Glucose, Bld: 96 mg/dL (ref 70–99)
Potassium: 4 mEq/L (ref 3.5–5.1)
Sodium: 141 mEq/L (ref 135–145)

## 2020-09-27 LAB — HEPATIC FUNCTION PANEL
ALT: 18 U/L (ref 0–35)
AST: 17 U/L (ref 0–37)
Albumin: 4.4 g/dL (ref 3.5–5.2)
Alkaline Phosphatase: 57 U/L (ref 39–117)
Bilirubin, Direct: 0.1 mg/dL (ref 0.0–0.3)
Total Bilirubin: 0.7 mg/dL (ref 0.2–1.2)
Total Protein: 7.2 g/dL (ref 6.0–8.3)

## 2020-09-27 LAB — HEMOGLOBIN A1C: Hgb A1c MFr Bld: 6.6 % — ABNORMAL HIGH (ref 4.6–6.5)

## 2020-10-01 ENCOUNTER — Ambulatory Visit (INDEPENDENT_AMBULATORY_CARE_PROVIDER_SITE_OTHER): Payer: Medicare HMO | Admitting: Internal Medicine

## 2020-10-01 ENCOUNTER — Other Ambulatory Visit: Payer: Self-pay

## 2020-10-01 DIAGNOSIS — G6289 Other specified polyneuropathies: Secondary | ICD-10-CM

## 2020-10-01 DIAGNOSIS — E7800 Pure hypercholesterolemia, unspecified: Secondary | ICD-10-CM

## 2020-10-01 DIAGNOSIS — Z853 Personal history of malignant neoplasm of breast: Secondary | ICD-10-CM | POA: Diagnosis not present

## 2020-10-01 DIAGNOSIS — E1142 Type 2 diabetes mellitus with diabetic polyneuropathy: Secondary | ICD-10-CM

## 2020-10-01 DIAGNOSIS — F439 Reaction to severe stress, unspecified: Secondary | ICD-10-CM

## 2020-10-01 DIAGNOSIS — E039 Hypothyroidism, unspecified: Secondary | ICD-10-CM

## 2020-10-01 DIAGNOSIS — R200 Anesthesia of skin: Secondary | ICD-10-CM

## 2020-10-01 DIAGNOSIS — I10 Essential (primary) hypertension: Secondary | ICD-10-CM

## 2020-10-01 DIAGNOSIS — R1310 Dysphagia, unspecified: Secondary | ICD-10-CM | POA: Diagnosis not present

## 2020-10-01 LAB — HM DIABETES FOOT EXAM

## 2020-10-01 NOTE — Progress Notes (Signed)
Patient ID: Julie Hoover, female   DOB: January 12, 1930, 87 y.o.   MRN: [id number]   Subjective:    Patient ID: Julie Hoover, female    DOB: 24-Jul-1929, 87 y.o.   MRN: [id number]  HPI This visit occurred during the SARS-CoV-2 public health emergency.  Safety protocols were in place, including screening questions prior to the visit, additional usage of staff PPE, and extensive cleaning of exam room while observing appropriate contact time as indicated for disinfecting solutions.   Patient here for a scheduled follow up.  Here to follow up regarding her blood pressure, blood sugar and cholesterol.  She is doing relatively well.  Tries to stay active.  Still has issues with dysphagia.  Has seen GI.  No change.  Has to watch what she eats.  No chest pain or sob reported.  No abdominal pain.  Bowels moving - with prune juice daily.  Does report left hand numbness - notices when holding a book and reading.  Shakes - resolves.  Overall handling things well.    Past Medical History:  Diagnosis Date   Arthritis    Breast cancer ([place]) 1986   s/p right lumpectomy, XRT and Tamoxifen, mastectomy   Cancer ([place])    skin ca   Chicken pox    Colon polyps    Diabetes ([place])    Diverticulosis    Environmental allergies    GERD (gastroesophageal reflux disease)    Glaucoma    Hypercholesterolemia    Hyperglycemia    Hypertension    Hypothyroidism    Osteopenia    Peripheral neuropathy    Personal history of radiation therapy 1986   Urethral stricture    Past Surgical History:  Procedure Laterality Date   BREAST BIOPSY Right 6/86   lumpectomy-positive/rad   BREAST LUMPECTOMY  1986   right   ESOPHAGOGASTRODUODENOSCOPY (EGD) WITH PROPOFOL N/A 12/17/2015   Procedure: ESOPHAGOGASTRODUODENOSCOPY (EGD) WITH PROPOFOL;  Surgeon: Lollie Sails, MD;  Location: [facility] ENDOSCOPY;  Service: Endoscopy;  Laterality: N/A;   EYE SURGERY     Bilateral cataracts removed   KNEE SURGERY  06/2008   torn  meniscus, right   KNEE SURGERY  2012   torn menisucs, right   MASTECTOMY Right 1993   right, recurrence   TUBAL LIGATION     URETHRAL DILATION     Family History  Problem Relation Age of Onset   Heart disease Father    Hypertension Father    COPD Mother    Hypertension Brother        x2   Hypercholesterolemia Brother        x2   Hypertension Sister    Hypercholesterolemia Sister    Diabetes Mellitus II Sister    Cancer Other        mouth, aunt   Colon cancer Neg Hx    Breast cancer Neg Hx    Social History   Socioeconomic History   Marital status: Widowed    Spouse name: Not on file   Number of children: 2   Years of education: Not on file   Highest education level: Not on file  Occupational History   Not on file  Tobacco Use   Smoking status: Never   Smokeless tobacco: Never  Substance and Sexual Activity   Alcohol use: No    Alcohol/week: 0.0 standard drinks   Drug use: No   Sexual activity: Never  Other Topics Concern   Not on file  Social History Narrative  Not on file   Social Determinants of Health   Financial Resource Strain: Low Risk    Difficulty of Paying Living Expenses: Not hard at all  Food Insecurity: No Food Insecurity   Worried About Charity fundraiser in the Last Year: Never true   [place] in the Last Year: Never true  Transportation Needs: No Transportation Needs   Lack of Transportation (Medical): No   Lack of Transportation (Non-Medical): No  Physical Activity: Not on file  Stress: No Stress Concern Present   Feeling of Stress : Not at all  Social Connections: Unknown   Frequency of Communication with Friends and Family: More than three times a week   Frequency of Social Gatherings with Friends and Family: More than three times a week   Attends Religious Services: Not on file   Active Member of Clubs or Organizations: Not on file   Attends Archivist Meetings: Not on file   Marital Status: Widowed     Outpatient Encounter Medications as of 10/01/2020  Medication Sig   ACCU-CHEK AVIVA PLUS test strip USE AS INSTRUCTED TO CHECK BLOOD SUGAR ONCE DAILY   Accu-Chek Softclix Lancets lancets USE AS INSTRUCTED TO CHECK BLOOD SUGAR ONCE DAILY   amLODipine (NORVASC) 5 MG tablet TAKE 1 TABLET EVERY DAY   aspirin 81 MG tablet Take 81 mg by mouth daily.   Blood Glucose Monitoring Suppl (ACCU-CHEK AVIVA) device Use as instructed   Calcium Carbonate-Vitamin D 600-400 MG-UNIT chew tablet Chew 1 tablet by mouth 2 (two) times daily.   carbamide peroxide (DEBROX) 6.5 % OTIC solution 4-5 drops in both ears daily.  Massage for approximately 5 minutes daily.   cetirizine (ZYRTEC) 10 MG tablet Take 10 mg by mouth daily.   Chromium-Cinnamon (CINNAMON PLUS CHROMIUM PO) Take by mouth.   dorzolamide-timolol (COSOPT) 22.3-6.8 MG/ML ophthalmic solution Place 1 drop into the left eye 2 (two) times daily.    gabapentin (NEURONTIN) 100 MG capsule TAKE 1 CAPSULE EVERY MORNING AND TAKE 2 CAPSULES AT BEDTIME.   latanoprost (XALATAN) 0.005 % ophthalmic solution Place 1 drop into the left eye at bedtime.   levothyroxine (SYNTHROID) 50 MCG tablet TAKE 1 TABLET EVERY DAY   omeprazole (PRILOSEC) 20 MG capsule TAKE 1 CAPSULE TWICE DAILY   rosuvastatin (CRESTOR) 20 MG tablet Take 1 tablet (20 mg total) by mouth daily.   temazepam (RESTORIL) 30 MG capsule Take 1 capsule (30 mg total) by mouth at bedtime as needed.   No facility-administered encounter medications on file as of 10/01/2020.     Review of Systems  Constitutional:  Negative for appetite change and unexpected weight change.  HENT:  Negative for congestion.   Respiratory:  Negative for cough, chest tightness and shortness of breath.   Cardiovascular:  Negative for chest pain and palpitations.  Gastrointestinal:  Negative for abdominal pain, diarrhea, nausea and vomiting.  Genitourinary:  Negative for difficulty urinating and dysuria.  Musculoskeletal:  Negative  for joint swelling and myalgias.  Skin:  Negative for color change and rash.  Neurological:  Negative for dizziness, light-headedness and headaches.  Psychiatric/Behavioral:  Negative for agitation and dysphoric mood.       Objective:    Physical Exam Vitals reviewed.  Constitutional:      General: She is not in acute distress.    Appearance: Normal appearance.  HENT:     Head: Normocephalic and atraumatic.     Right Ear: External ear normal.     Left  Ear: External ear normal.  Eyes:     General: No scleral icterus.       Right eye: No discharge.        Left eye: No discharge.     Conjunctiva/sclera: Conjunctivae normal.  Neck:     Thyroid: No thyromegaly.  Cardiovascular:     Rate and Rhythm: Normal rate and regular rhythm.  Pulmonary:     Effort: No respiratory distress.     Breath sounds: Normal breath sounds. No wheezing.  Abdominal:     General: Bowel sounds are normal.     Palpations: Abdomen is soft.     Tenderness: There is no abdominal tenderness.  Musculoskeletal:        General: No swelling or tenderness.     Cervical back: Neck supple. No tenderness.  Lymphadenopathy:     Cervical: No cervical adenopathy.  Skin:    Findings: No erythema or rash.  Neurological:     Mental Status: She is alert.  Psychiatric:        Mood and Affect: Mood normal.        Behavior: Behavior normal.    BP 118/66   Pulse 66   Temp 98.1 F (36.7 C)   Resp 16   Ht $R'5\' 4"'ew$  (1.626 m)   Wt 133 lb 9.6 oz (60.6 kg)   SpO2 97%   BMI 22.93 kg/m  Wt Readings from Last 3 Encounters:  10/01/20 133 lb 9.6 oz (60.6 kg)  05/31/20 134 lb 12.8 oz (61.1 kg)  01/09/20 139 lb 9.6 oz (63.3 kg)     Lab Results  Component Value Date   WBC 7.2 01/05/2020   HGB 13.9 01/05/2020   HCT 41.8 01/05/2020   PLT 215.0 01/05/2020   GLUCOSE 96 09/27/2020   CHOL 182 09/27/2020   TRIG 77.0 09/27/2020   HDL 73.60 09/27/2020   LDLCALC 93 09/27/2020   ALT 18 09/27/2020   AST 17 09/27/2020   NA  141 09/27/2020   K 4.0 09/27/2020   CL 104 09/27/2020   CREATININE 0.75 09/27/2020   BUN 10 09/27/2020   CO2 28 09/27/2020   TSH 2.13 01/05/2020   HGBA1C 6.6 (H) 09/27/2020   MICROALBUR <0.7 01/05/2020    DG ESOPHAGUS W DOUBLE CM (HD)  Result Date: 03/27/2020 CLINICAL DATA:  Dysphagia.  Esophageal diverticulum.  Hiatal hernia. EXAM: ESOPHOGRAM/BARIUM SWALLOW TECHNIQUE: Combined double contrast and single contrast examination performed using effervescent crystals, thick barium liquid, and thin barium liquid. FLUOROSCOPY TIME:  Fluoroscopy Time:  1 minutes 48 seconds Radiation Exposure Index (if provided by the fluoroscopic device): 20.4 mGy COMPARISON:  Barium swallow 11/01/2015. FINDINGS: Zenker's diverticulum is again noted. Diverticulum may be slightly larger on today's exam than on prior study of 11/01/2015. No evidence of aspiration. Cervical esophagus is widely patent. Small mid thoracic esophageal diverticulum again noted. Thoracic esophagus is widely patent. Large hiatal hernia again noted. Given Zenker's diverticulum, barium tablet was not administered. Mild reflux. IMPRESSION: 1. Zenker's diverticulum is again noted. Diverticulum may be slightly larger on today's exam than on prior study of 11/01/2015. Cervical esophagus is widely patent. 2. Small midthoracic esophageal diverticulum again noted. Thoracic esophagus is widely patent. Large hiatal hernia again noted. Mild reflux. Electronically Signed   By: Marcello Moores  Register   On: 03/27/2020 11:28       Assessment & Plan:   Problem List Items Addressed This Visit     Dysphagia    Dysphagia as outlined previously.  Saw GI.  Zenker's diverticulum.  Desires no surgical intervention.  Follow.        History of breast cancer    Mammogram 01/11/20 - Birads I.        Hypercholesterolemia    Continue crestor.  Low cholesterol diet and exercise.  Follow lipid panel and liver function tests.         Relevant Orders   Hepatic function  panel   Lipid panel   Hypertension    Continue amlodipine. Follow pressures.  Follow metabolic panel.        Relevant Orders   CBC with Differential/Platelet   Hypothyroidism    On thyroid replacement.  Follow tsh.         Relevant Orders   TSH   Numbness of left hand    Appears to be c/w carpal tunnel.  Discussed cock up wrist splint.  Follow. Notify me if desires further intervention or evaluation.         Peripheral neuropathy    On gabapentin.  Stable.         Stress    Has good support.  Stable.  Has restoril if needed.         Type 2 diabetes mellitus with diabetic polyneuropathy, without long-term current use of insulin (HCC)    Outside sugars reviewed.  Doing well.  Continue low carb diet and exercise.  Follow met b and a1c. Lab Results  Component Value Date   HGBA1C 6.6 (H) 09/27/2020          Relevant Orders   Hemoglobin B3H   Basic metabolic panel   Microalbumin / creatinine urine ratio     Einar Pheasant, MD

## 2020-10-01 NOTE — Patient Instructions (Signed)
Cock up wrist splint 

## 2020-10-07 ENCOUNTER — Encounter: Payer: Self-pay | Admitting: Internal Medicine

## 2020-10-07 NOTE — Assessment & Plan Note (Signed)
On thyroid replacement.  Follow tsh.  

## 2020-10-07 NOTE — Assessment & Plan Note (Signed)
Continue amlodipine.  Follow pressures.  Follow metabolic panel.   

## 2020-10-07 NOTE — Assessment & Plan Note (Signed)
Has good support.  Stable.  Has restoril if needed.

## 2020-10-07 NOTE — Assessment & Plan Note (Signed)
Outside sugars reviewed.  Doing well.  Continue low carb diet and exercise.  Follow met b and a1c. Lab Results  Component Value Date   HGBA1C 6.6 (H) 09/27/2020

## 2020-10-07 NOTE — Assessment & Plan Note (Signed)
Mammogram 01/11/20 - Birads I.

## 2020-10-07 NOTE — Assessment & Plan Note (Signed)
Appears to be c/w carpal tunnel.  Discussed cock up wrist splint.  Follow. Notify me if desires further intervention or evaluation.

## 2020-10-07 NOTE — Assessment & Plan Note (Signed)
Dysphagia as outlined previously.  Saw GI.  Zenker's diverticulum.  Desires no surgical intervention.  Follow.

## 2020-10-07 NOTE — Assessment & Plan Note (Signed)
Continue crestor.  Low cholesterol diet and exercise. Follow lipid panel and liver function tests.   

## 2020-10-07 NOTE — Assessment & Plan Note (Signed)
On gabapentin.  Stable.  

## 2020-10-23 ENCOUNTER — Telehealth: Payer: Self-pay

## 2020-10-23 ENCOUNTER — Other Ambulatory Visit: Payer: Self-pay

## 2020-10-23 MED ORDER — GABAPENTIN 100 MG PO CAPS: ORAL_CAPSULE | ORAL | 1 refills | Status: DC

## 2020-10-23 NOTE — Telephone Encounter (Signed)
Pt needs refill on gabapentin (NEURONTIN) 100 MG capsule sent to [place] ASAP-pt is almost out and states that [facility] just told her that that certain medication is not on automatic refills.

## 2020-10-23 NOTE — Telephone Encounter (Signed)
Gabapentin has been refilled.

## 2020-10-25 ENCOUNTER — Other Ambulatory Visit: Payer: Self-pay | Admitting: Internal Medicine

## 2020-11-14 ENCOUNTER — Other Ambulatory Visit: Payer: Self-pay | Admitting: Internal Medicine

## 2020-11-14 DIAGNOSIS — D1801 Hemangioma of skin and subcutaneous tissue: Secondary | ICD-10-CM | POA: Diagnosis not present

## 2020-11-23 ENCOUNTER — Ambulatory Visit: Payer: Medicare HMO

## 2020-11-27 ENCOUNTER — Ambulatory Visit (INDEPENDENT_AMBULATORY_CARE_PROVIDER_SITE_OTHER): Payer: Medicare HMO

## 2020-11-27 VITALS — Ht 64.0 in | Wt 133.0 lb

## 2020-11-27 DIAGNOSIS — Z1231 Encounter for screening mammogram for malignant neoplasm of breast: Secondary | ICD-10-CM | POA: Diagnosis not present

## 2020-11-27 DIAGNOSIS — Z0000 Encounter for general adult medical examination without abnormal findings: Secondary | ICD-10-CM | POA: Diagnosis not present

## 2020-11-27 NOTE — Progress Notes (Signed)
Subjective:   Secilia Murrill is a 87 y.o. female who presents for Medicare Annual (Subsequent) preventive examination.  Review of Systems    No ROS.  Medicare Wellness Virtual Visit.  Visual/audio telehealth visit, UTA vital signs.   See social history for additional risk factors.   Cardiac Risk Factors include: advanced age (>23mn, >>32women);diabetes mellitus;hypertension     Objective:    Today's Vitals   11/27/20 0910  Weight: 133 lb (60.3 kg)  Height: '5\' 4"'$  (1.626 m)   Body mass index is 22.83 kg/m.  Advanced Directives 11/27/2020 11/23/2019 10/28/2016 12/17/2015 10/29/2015  Does Patient Have a Medical Advance Directive? Yes Yes Yes Yes Yes  Type of [place] Healthcare Power of Attorney Living will;Healthcare Power of [place] will  Does patient want to make changes to medical advance directive? No - Patient declined No - Patient declined No - Patient declined - -  Copy of [place]? No - copy requested No - copy requested No - copy requested Yes No - copy requested    Current Medications (verified) Outpatient Encounter Medications as of 11/27/2020  Medication Sig   Multiple Vitamins-Minerals (PRESERVISION AREDS 2) CHEW Chew 2 capsules by mouth.   ACCU-CHEK AVIVA PLUS test strip USE AS INSTRUCTED TO CHECK BLOOD SUGAR ONCE DAILY   Accu-Chek Softclix Lancets lancets USE AS INSTRUCTED TO CHECK BLOOD SUGAR ONCE DAILY   amLODipine (NORVASC) 5 MG tablet TAKE 1 TABLET EVERY DAY   aspirin 81 MG tablet Take 81 mg by mouth daily.   Blood Glucose Monitoring Suppl (ACCU-CHEK AVIVA) device Use as instructed   Calcium Carbonate-Vitamin D 600-400 MG-UNIT chew tablet Chew 1 tablet by mouth 2 (two) times daily.   carbamide peroxide (DEBROX) 6.5 % OTIC solution 4-5 drops in both ears daily.  Massage for approximately 5 minutes daily.   cetirizine (ZYRTEC) 10 MG tablet Take  10 mg by mouth daily.   Chromium-Cinnamon (CINNAMON PLUS CHROMIUM PO) Take by mouth.   dorzolamide-timolol (COSOPT) 22.3-6.8 MG/ML ophthalmic solution Place 1 drop into the left eye 2 (two) times daily.    gabapentin (NEURONTIN) 100 MG capsule TAKE 1 CAPSULE EVERY MORNING AND TAKE 2 CAPSULES AT BEDTIME.   latanoprost (XALATAN) 0.005 % ophthalmic solution Place 1 drop into the left eye at bedtime.   levothyroxine (SYNTHROID) 50 MCG tablet TAKE 1 TABLET EVERY DAY   omeprazole (PRILOSEC) 20 MG capsule TAKE 1 CAPSULE TWICE DAILY   rosuvastatin (CRESTOR) 20 MG tablet TAKE 1 TABLET EVERY DAY   temazepam (RESTORIL) 30 MG capsule Take 1 capsule (30 mg total) by mouth at bedtime as needed.   No facility-administered encounter medications on file as of 11/27/2020.    Allergies (verified) Iodinated diagnostic agents, Ciprofloxacin, Gentian root, Halothane, Hydrocodone, Norco [hydrocodone-acetaminophen], and Sulfa antibiotics   History: Past Medical History:  Diagnosis Date   Arthritis    [place]ast [place]   s/p right lumpectomy, XRT and Tamoxifen, mastectomy   [place]    skin ca   Chicken pox    Colon polyps    [place]    Diverticulosis    Environmental allergies    GERD (gastroesophageal reflux disease)    Glaucoma    Hypercholesterolemia    Hyperglycemia    Hypertension    Hypothyroidism    Osteopenia    Peripheral neuropathy    Personal history of radiation therapy 1986   Urethral stricture  Past Surgical History:  Procedure Laterality Date   BREAST BIOPSY Right 6/86   lumpectomy-positive/rad   BREAST LUMPECTOMY  1986   right   ESOPHAGOGASTRODUODENOSCOPY (EGD) WITH PROPOFOL N/A 12/17/2015   Procedure: ESOPHAGOGASTRODUODENOSCOPY (EGD) WITH PROPOFOL;  Surgeon: Lollie Sails, MD;  Location: [facility] ENDOSCOPY;  Service: Endoscopy;  Laterality: N/A;   EYE SURGERY     Bilateral cataracts removed   KNEE SURGERY  06/2008   torn meniscus, right   KNEE SURGERY   2012   torn menisucs, right   MASTECTOMY Right 1993   right, recurrence   TUBAL LIGATION     URETHRAL DILATION     Family History  Problem Relation Age of Onset   Heart disease Father    Hypertension Father    COPD Mother    Hypertension Brother        x2   Hypercholesterolemia Brother        x2   Hypertension Sister    Hypercholesterolemia Sister    Diabetes Mellitus II Sister    Cancer Other        mouth, aunt   Colon cancer Neg Hx    Breast cancer Neg Hx    Social History   Socioeconomic History   Marital status: Widowed    Spouse name: Not on file   Number of children: 2   Years of education: Not on file   Highest education level: Not on file  Occupational History   Not on file  Tobacco Use   Smoking status: Never   Smokeless tobacco: Never  Substance and Sexual Activity   Alcohol use: No    Alcohol/week: 0.0 standard drinks   Drug use: No   Sexual activity: Never  Other Topics Concern   Not on file  Social History Narrative   Not on file   Social Determinants of Health   Financial Resource Strain: Low Risk    Difficulty of Paying Living Expenses: Not hard at all  Food Insecurity: No Food Insecurity   Worried About Charity fundraiser in the Last Year: Never true   [place] in the Last Year: Never true  Transportation Needs: No Transportation Needs   Lack of Transportation (Medical): No   Lack of Transportation (Non-Medical): No  Physical Activity: Not on file  Stress: No Stress Concern Present   Feeling of Stress : Not at all  Social Connections: Unknown   Frequency of Communication with Friends and Family: More than three times a week   Frequency of Social Gatherings with Friends and Family: More than three times a week   Attends Religious Services: Not on file   Active Member of Clubs or Organizations: Not on file   Attends Archivist Meetings: Not on file   Marital Status: Widowed    Tobacco Counseling Counseling given:  Not Answered   Clinical Intake:  Pre-visit preparation completed: Yes    Nutrition Risk Assessment: Has the patient had any N/V/D within the last 2 months?  No  Does the patient have any non-healing wounds?  No  Has the patient had any unintentional weight loss or weight gain?  No   Diabetes: If diabetic, was a CBG obtained today?  Yes , [id number] Did the patient bring in their glucometer from home?  No  How often do you monitor your CBG's? Every morning.   Financial Strains and Diabetes Management: Are you having any financial strains with the device, your supplies or your medication? No .  Does the patient want to be seen by Chronic Care Management for management of their diabetes?  No  Would the patient like to be referred to a Nutritionist or for Diabetic Management?  No    Diabetes: Yes (Followed by pcp)  How often do you need to have someone help you when you read instructions, pamphlets, or other written materials from your doctor or pharmacy?: 1 - Never   Interpreter Needed?: No      Activities of Daily Living In your present state of health, do you have any difficulty performing the following activities: 11/27/2020  Hearing? N  Vision? N  Difficulty concentrating or making decisions? N  Walking or climbing stairs? N  Dressing or bathing? N  Doing errands, shopping? N  Preparing Food and eating ? N  Using the Toilet? N  In the past six months, have you accidently leaked urine? N  Do you have problems with loss of bowel control? N  Managing your Medications? N  Managing your Finances? N  Housekeeping or managing your Housekeeping? N  Some recent data might be hidden    Patient Care Team: Einar Pheasant, MD as PCP - General (Internal Medicine)  Indicate any recent Medical Services you may have received from other than [facility] providers in the past year (date may be approximate).     Assessment:   This is a routine wellness examination for [facility].  I connected  with Nirel today by telephone and verified that I am speaking with the correct person using two identifiers. Location patient: home Location provider: work Persons participating in the virtual visit: patient, Marine scientist.    I discussed the limitations, risks, security and privacy concerns of performing an evaluation and management service by telephone and the availability of in person appointments. The patient expressed understanding and verbally consented to this telephonic visit.    Interactive audio and video telecommunications were attempted between this provider and patient, however failed, due to patient having technical difficulties OR patient did not have access to video capability.  We continued and completed visit with audio only.  Some vital signs may be absent or patient reported.   Hearing/Vision screen Hearing Screening - Comments:: Patient is able to hear conversational tones without difficulty. No issues reported. Vision Screening - Comments:: Followed by [facility]  Wears corrective lenses  Cataract extraction, bilateral  Glaucoma; drops in use  They have seen their ophthalmologist in the last 6 months.  Dietary issues and exercise activities discussed: Current Exercise Habits: Home exercise routine, Type of exercise: treadmill, Intensity: Mild Healthy diet Protein drink/Boost added as needed Good water intake   Goals Addressed               This Visit's Progress     Patient Stated     Maintain Healthy Lifestyle (pt-stated)   On track     Stay active  Healthy diet Stay hydrated       Depression Screen PHQ 2/9 Scores 11/27/2020 10/01/2020 11/23/2019 12/21/2018 10/23/2016 05/05/2016 01/31/2016  PHQ - 2 Score 0 0 0 0 0 0 0  PHQ- 9 Score - - - - 0 - -    Fall Risk Fall Risk  11/27/2020 10/01/2020 11/23/2019 12/21/2018 03/05/2018  Falls in the past year? 0 0 1 1 0  Comment - - - - Emmi Telephone Survey: data to providers prior to load  Number falls in past yr: - 0 0  0 -  Comment - - missed a step and  caught herself on her knee. No injury. - -  Injury with Fall? - 0 - 1 -  Risk for fall due to : - - - History of fall(s);Impaired vision -  Follow up Falls evaluation completed Falls evaluation completed Falls evaluation completed Falls evaluation completed -    FALL RISK PREVENTION PERTAINING TO THE HOME: Adequate lighting in your home to reduce risk of falls? Yes   ASSISTIVE DEVICES UTILIZED TO PREVENT FALLS: Life alert? Yes  Use of a cane, walker or w/c? No   TIMED UP AND GO: Was the test performed? No .   Cognitive Function: Patient is alert and oriented x3.  Enjoys playing cards, reading and other brain stimulating activities. MMSE - Mini Mental State Exam 10/28/2016 10/29/2015  Orientation to time 5 5  Orientation to Place 5 5  Registration 3 3  Attention/ Calculation 5 5  Recall 3 3  Language- name 2 objects 2 2  Language- repeat 1 1  Language- follow 3 step command 3 3  Language- read & follow direction 1 1  Write a sentence 1 1  Copy design 1 1  Total score 30 30     6CIT Screen 11/27/2020 11/23/2019  What Year? 0 points 0 points  What month? 0 points 0 points  What time? 0 points 0 points  Months in reverse - 0 points    Immunizations Immunization History  Administered Date(s) Administered   Fluad Quad(high Dose 65+) 12/21/2018, 01/09/2020   Influenza Split 01/18/2012   Influenza, High Dose Seasonal PF 01/31/2016, 01/20/2017, 01/05/2018   Influenza,inj,Quad PF,6+ Mos 01/12/2013, 12/14/2013, 01/09/2015   PFIZER(Purple Top)SARS-COV-2 Vaccination 01/18/2020, 02/08/2020   Pneumococcal Conjugate-13 05/17/2013   Pneumococcal Polysaccharide-23 01/29/2015   Tdap 01/17/2013   Zoster Recombinat (Shingrix) 05/31/2020, 07/30/2020   Zoster, Live 03/26/2006   Health Maintenance Health Maintenance  Topic Date Due   COVID-19 Vaccine (3 - Booster for [place] series) 07/08/2020   URINE MICROALBUMIN  01/04/2021   INFLUENZA VACCINE   01/03/2021 (Originally 11/19/2020)   MAMMOGRAM  01/10/2021   HEMOGLOBIN A1C  03/29/2021   OPHTHALMOLOGY EXAM  08/31/2021   FOOT EXAM  10/01/2021   TETANUS/TDAP  01/18/2023   DEXA SCAN  Completed   PNA vac Low Risk Adult  Completed   Zoster Vaccines- Shingrix  Completed   HPV VACCINES  Aged Out   Colorectal cancer screening: No longer required.   Mammogram- ordered today. Number provided for scheduling.   Lung Cancer Screening: (Low Dose CT Chest recommended if Age 78-80 years, 30 pack-year currently smoking OR have quit w/in 15years.) does not qualify.   Hepatitis C Screening: does not qualify  Vision Screening: Recommended annual ophthalmology exams for early detection of glaucoma and other disorders of the eye.  Dental Screening: Recommended annual dental exams for proper oral hygiene.  Community Resource Referral / Chronic Care Management: CRR required this visit?  No   CCM required this visit?  No      Plan:     I have personally reviewed and noted the following in the patient's chart:   Medical and social history Use of alcohol, tobacco or illicit drugs  Current medications and supplements including opioid prescriptions. Patient is not currently taking opioid.  Functional ability and status Nutritional status Physical activity Advanced directives List of other physicians Hospitalizations, surgeries, and ER visits in previous 12 months Vitals Screenings to include cognitive, depression, and falls Referrals and appointments  In addition, I have reviewed and discussed with patient certain preventive  protocols, quality metrics, and best practice recommendations. A written personalized care plan for preventive services as well as general preventive health recommendations were provided to patient via mychart.     Varney Biles, LPN   [id number]

## 2020-11-27 NOTE — Patient Instructions (Addendum)
  Julie Hoover , Thank you for taking time to come for your Medicare Wellness Visit. I appreciate your ongoing commitment to your health goals. Please review the following plan we discussed and let me know if I can assist you in the future.   These are the goals we discussed:  Goals       Patient Stated     Maintain Healthy Lifestyle (pt-stated)      Stay active  Healthy diet Stay hydrated        This is a list of the screening recommended for you and due dates:  Health Maintenance  Topic Date Due   Urine Protein Check  01/04/2021   COVID-19 Vaccine (3 - Booster for [employer] series) 12/13/2020*   Flu Shot  01/03/2021*   Mammogram  01/10/2021   Hemoglobin A1C  03/29/2021   Eye exam for diabetics  08/31/2021   Complete foot exam   10/01/2021   Tetanus Vaccine  01/18/2023   DEXA scan (bone density measurement)  Completed   Pneumonia vaccines  Completed   Zoster (Shingles) Vaccine  Completed   HPV Vaccine  Aged Out  *Topic was postponed. The date shown is not the original due date.

## 2020-12-07 DIAGNOSIS — S0501XA Injury of conjunctiva and corneal abrasion without foreign body, right eye, initial encounter: Secondary | ICD-10-CM | POA: Diagnosis not present

## 2020-12-11 DIAGNOSIS — S0501XD Injury of conjunctiva and corneal abrasion without foreign body, right eye, subsequent encounter: Secondary | ICD-10-CM | POA: Diagnosis not present

## 2020-12-31 DIAGNOSIS — D045 Carcinoma in situ of skin of trunk: Secondary | ICD-10-CM | POA: Diagnosis not present

## 2020-12-31 DIAGNOSIS — L821 Other seborrheic keratosis: Secondary | ICD-10-CM | POA: Diagnosis not present

## 2020-12-31 DIAGNOSIS — D485 Neoplasm of uncertain behavior of skin: Secondary | ICD-10-CM | POA: Diagnosis not present

## 2020-12-31 DIAGNOSIS — Z872 Personal history of diseases of the skin and subcutaneous tissue: Secondary | ICD-10-CM | POA: Diagnosis not present

## 2020-12-31 DIAGNOSIS — L578 Other skin changes due to chronic exposure to nonionizing radiation: Secondary | ICD-10-CM | POA: Diagnosis not present

## 2020-12-31 DIAGNOSIS — D1801 Hemangioma of skin and subcutaneous tissue: Secondary | ICD-10-CM | POA: Diagnosis not present

## 2020-12-31 DIAGNOSIS — L570 Actinic keratosis: Secondary | ICD-10-CM | POA: Diagnosis not present

## 2021-01-04 ENCOUNTER — Other Ambulatory Visit: Payer: Self-pay | Admitting: Internal Medicine

## 2021-01-04 DIAGNOSIS — E1142 Type 2 diabetes mellitus with diabetic polyneuropathy: Secondary | ICD-10-CM

## 2021-01-14 ENCOUNTER — Ambulatory Visit
Admission: RE | Admit: 2021-01-14 | Discharge: 2021-01-14 | Disposition: A | Discharge status: Home / Self Care | Payer: Medicare HMO | Source: Ambulatory Visit | Attending: Internal Medicine | Admitting: Internal Medicine

## 2021-01-14 ENCOUNTER — Other Ambulatory Visit: Payer: Self-pay

## 2021-01-14 DIAGNOSIS — Z1231 Encounter for screening mammogram for malignant neoplasm of breast: Secondary | ICD-10-CM | POA: Insufficient documentation

## 2021-01-23 ENCOUNTER — Telehealth: Payer: Self-pay | Admitting: Internal Medicine

## 2021-01-23 MED ORDER — GABAPENTIN 100 MG PO CAPS: ORAL_CAPSULE | ORAL | 1 refills | Status: DC

## 2021-01-23 NOTE — Telephone Encounter (Signed)
Patient needs a refill on her gabapentin (NEURONTIN) 100 MG capsule.

## 2021-01-24 DIAGNOSIS — D045 Carcinoma in situ of skin of trunk: Secondary | ICD-10-CM | POA: Diagnosis not present

## 2021-01-24 DIAGNOSIS — C44529 Squamous cell carcinoma of skin of other part of trunk: Secondary | ICD-10-CM | POA: Diagnosis not present

## 2021-01-31 ENCOUNTER — Other Ambulatory Visit (INDEPENDENT_AMBULATORY_CARE_PROVIDER_SITE_OTHER): Payer: Medicare HMO

## 2021-01-31 ENCOUNTER — Other Ambulatory Visit: Payer: Self-pay

## 2021-01-31 DIAGNOSIS — I10 Essential (primary) hypertension: Secondary | ICD-10-CM | POA: Diagnosis not present

## 2021-01-31 DIAGNOSIS — E1142 Type 2 diabetes mellitus with diabetic polyneuropathy: Secondary | ICD-10-CM | POA: Diagnosis not present

## 2021-01-31 DIAGNOSIS — E039 Hypothyroidism, unspecified: Secondary | ICD-10-CM

## 2021-01-31 DIAGNOSIS — E7800 Pure hypercholesterolemia, unspecified: Secondary | ICD-10-CM | POA: Diagnosis not present

## 2021-01-31 LAB — LIPID PANEL
Cholesterol: 181 mg/dL (ref 0–200)
HDL: 78.4 mg/dL (ref >39.00)
LDL Cholesterol: 87 mg/dL (ref 0–99)
NonHDL: 102.22
Total CHOL/HDL Ratio: 2
Triglycerides: 76 mg/dL (ref 0.0–149.0)
VLDL: 15.2 mg/dL (ref 0.0–40.0)

## 2021-01-31 LAB — BASIC METABOLIC PANEL
BUN: 13 mg/dL (ref 6–23)
CO2: 31 mEq/L (ref 19–32)
Calcium: 9.9 mg/dL (ref 8.4–10.5)
Chloride: 104 mEq/L (ref 96–112)
Creatinine, Ser: 0.79 mg/dL (ref 0.40–1.20)
GFR: 66.36 mL/min (ref >60.00)
Glucose, Bld: 108 mg/dL — ABNORMAL HIGH (ref 70–99)
Potassium: 4.1 mEq/L (ref 3.5–5.1)
Sodium: 141 mEq/L (ref 135–145)

## 2021-01-31 LAB — CBC WITH DIFFERENTIAL/PLATELET
Basophils Absolute: 0 10*3/uL (ref 0.0–0.1)
Basophils Relative: 0.6 % (ref 0.0–3.0)
Eosinophils Absolute: 0.1 10*3/uL (ref 0.0–0.7)
Eosinophils Relative: 0.7 % (ref 0.0–5.0)
HCT: 40.8 % (ref 36.0–46.0)
Hemoglobin: 13.3 g/dL (ref 12.0–15.0)
Lymphocytes Relative: 24.5 % (ref 12.0–46.0)
Lymphs Abs: 1.8 10*3/uL (ref 0.7–4.0)
MCHC: 32.6 g/dL (ref 30.0–36.0)
MCV: 94 fl (ref 78.0–100.0)
Monocytes Absolute: 0.5 10*3/uL (ref 0.1–1.0)
Monocytes Relative: 7 % (ref 3.0–12.0)
Neutro Abs: 4.9 10*3/uL (ref 1.4–7.7)
Neutrophils Relative %: 67.2 % (ref 43.0–77.0)
Platelets: 228 10*3/uL (ref 150.0–400.0)
RBC: 4.34 Mil/uL (ref 3.87–5.11)
RDW: 14.4 % (ref 11.5–15.5)
WBC: 7.3 10*3/uL (ref 4.0–10.5)

## 2021-01-31 LAB — HEPATIC FUNCTION PANEL
ALT: 15 U/L (ref 0–35)
AST: 20 U/L (ref 0–37)
Albumin: 4.4 g/dL (ref 3.5–5.2)
Alkaline Phosphatase: 63 U/L (ref 39–117)
Bilirubin, Direct: 0.1 mg/dL (ref 0.0–0.3)
Total Bilirubin: 0.6 mg/dL (ref 0.2–1.2)
Total Protein: 7.7 g/dL (ref 6.0–8.3)

## 2021-01-31 LAB — MICROALBUMIN / CREATININE URINE RATIO
Creatinine,U: 27.4 mg/dL
Microalb Creat Ratio: 2.6 mg/g (ref 0.0–30.0)
Microalb, Ur: <0.7 mg/dL (ref 0.0–1.9)

## 2021-01-31 LAB — HEMOGLOBIN A1C: Hgb A1c MFr Bld: 6.5 % (ref 4.6–6.5)

## 2021-01-31 LAB — TSH: TSH: 1.61 u[IU]/mL (ref 0.35–5.50)

## 2021-02-05 ENCOUNTER — Other Ambulatory Visit: Payer: Self-pay

## 2021-02-05 ENCOUNTER — Ambulatory Visit (INDEPENDENT_AMBULATORY_CARE_PROVIDER_SITE_OTHER): Payer: Medicare HMO | Admitting: Internal Medicine

## 2021-02-05 ENCOUNTER — Encounter: Payer: Self-pay | Admitting: Internal Medicine

## 2021-02-05 VITALS — BP 138/66 | HR 68 | Temp 96.3°F | Ht 64.0 in | Wt 136.0 lb

## 2021-02-05 DIAGNOSIS — E1142 Type 2 diabetes mellitus with diabetic polyneuropathy: Secondary | ICD-10-CM | POA: Diagnosis not present

## 2021-02-05 DIAGNOSIS — Z853 Personal history of malignant neoplasm of breast: Secondary | ICD-10-CM | POA: Diagnosis not present

## 2021-02-05 DIAGNOSIS — I10 Essential (primary) hypertension: Secondary | ICD-10-CM | POA: Diagnosis not present

## 2021-02-05 DIAGNOSIS — F439 Reaction to severe stress, unspecified: Secondary | ICD-10-CM | POA: Diagnosis not present

## 2021-02-05 DIAGNOSIS — M25561 Pain in right knee: Secondary | ICD-10-CM | POA: Diagnosis not present

## 2021-02-05 DIAGNOSIS — E7800 Pure hypercholesterolemia, unspecified: Secondary | ICD-10-CM | POA: Diagnosis not present

## 2021-02-05 DIAGNOSIS — E039 Hypothyroidism, unspecified: Secondary | ICD-10-CM | POA: Diagnosis not present

## 2021-02-05 DIAGNOSIS — G6289 Other specified polyneuropathies: Secondary | ICD-10-CM

## 2021-02-05 DIAGNOSIS — Z23 Encounter for immunization: Secondary | ICD-10-CM

## 2021-02-05 NOTE — Progress Notes (Signed)
Patient ID: Julie Hoover, female   DOB: 1929/10/17, 87 y.o.   MRN: [id number]   Subjective:    Patient ID: Julie Hoover, female    DOB: September 21, 1929, 87 y.o.   MRN: [id number]  This visit occurred during the SARS-CoV-2 public health emergency.  Safety protocols were in place, including screening questions prior to the visit, additional usage of staff PPE, and extensive cleaning of exam room while observing appropriate contact time as indicated for disinfecting solutions.   Patient here for a scheduled follow up.    HPI Here to follow up regarding blood pressure, blood sugar and cholesterol.  She is doing well.  Feels good.  No chest pain or sob reported.  Has three steps in her house.  Fell - did not hold to grab bar.  Did not hit her head.  No headache.  No dizziness or light headedness.  No cough or congestion.  No acid reflux.  No abdominal pain.  Still with right knee discomfort.  Worse when damp and worse in am - when first gets up.  Once moving - better.  Desires no further intervention.     Past Medical History:  Diagnosis Date   Arthritis    Breast cancer ([place]) 1986   s/p right lumpectomy, XRT and Tamoxifen, mastectomy   Cancer ([place])    skin ca   Chicken pox    Colon polyps    Diabetes ([place])    Diverticulosis    Environmental allergies    GERD (gastroesophageal reflux disease)    Glaucoma    Hypercholesterolemia    Hyperglycemia    Hypertension    Hypothyroidism    Osteopenia    Peripheral neuropathy    Personal history of radiation therapy 1986   Urethral stricture    Past Surgical History:  Procedure Laterality Date   BREAST BIOPSY Right 6/86   lumpectomy-positive/rad   BREAST LUMPECTOMY  1986   right   ESOPHAGOGASTRODUODENOSCOPY (EGD) WITH PROPOFOL N/A 12/17/2015   Procedure: ESOPHAGOGASTRODUODENOSCOPY (EGD) WITH PROPOFOL;  Surgeon: Lollie Sails, MD;  Location: [facility] ENDOSCOPY;  Service: Endoscopy;  Laterality: N/A;   EYE SURGERY      Bilateral cataracts removed   KNEE SURGERY  06/2008   torn meniscus, right   KNEE SURGERY  2012   torn menisucs, right   MASTECTOMY Right 1993   right, recurrence   TUBAL LIGATION     URETHRAL DILATION     Family History  Problem Relation Age of Onset   Heart disease Father    Hypertension Father    COPD Mother    Hypertension Brother        x2   Hypercholesterolemia Brother        x2   Hypertension Sister    Hypercholesterolemia Sister    Diabetes Mellitus II Sister    Cancer Other        mouth, aunt   Colon cancer Neg Hx    Breast cancer Neg Hx    Social History   Socioeconomic History   Marital status: Widowed    Spouse name: Not on file   Number of children: 2   Years of education: Not on file   Highest education level: Not on file  Occupational History   Not on file  Tobacco Use   Smoking status: Never   Smokeless tobacco: Never  Substance and Sexual Activity   Alcohol use: No    Alcohol/week: 0.0 standard drinks   Drug use: No  Sexual activity: Never  Other Topics Concern   Not on file  Social History Narrative   Not on file   Social Determinants of Health   Financial Resource Strain: Low Risk    Difficulty of Paying Living Expenses: Not hard at all  Food Insecurity: No Food Insecurity   Worried About Charity fundraiser in the Last Year: Never true   [place] in the Last Year: Never true  Transportation Needs: No Transportation Needs   Lack of Transportation (Medical): No   Lack of Transportation (Non-Medical): No  Physical Activity: Not on file  Stress: No Stress Concern Present   Feeling of Stress : Not at all  Social Connections: Unknown   Frequency of Communication with Friends and Family: More than three times a week   Frequency of Social Gatherings with Friends and Family: More than three times a week   Attends Religious Services: Not on file   Active Member of Clubs or Organizations: Not on file   Attends Archivist  Meetings: Not on file   Marital Status: Widowed     Review of Systems  Constitutional:  Negative for appetite change and unexpected weight change.  HENT:  Negative for congestion and sinus pressure.   Respiratory:  Negative for cough, chest tightness and shortness of breath.   Cardiovascular:  Negative for chest pain, palpitations and leg swelling.  Gastrointestinal:  Negative for abdominal pain, diarrhea, nausea and vomiting.  Genitourinary:  Negative for difficulty urinating and dysuria.  Musculoskeletal:  Negative for joint swelling and myalgias.       Persistent right knee pain as outlined.   Skin:  Negative for color change and rash.  Neurological:  Negative for dizziness, light-headedness and headaches.  Psychiatric/Behavioral:  Negative for agitation and dysphoric mood.       Objective:     BP 138/66 (BP Location: Left Arm, Patient Position: Sitting, Cuff Size: Normal)   Pulse 68   Temp (!) 96.3 F (35.7 C) (Temporal)   Ht 5' 4"  (1.626 m)   Wt 136 lb (61.7 kg)   SpO2 96%   BMI 23.34 kg/m  Wt Readings from Last 3 Encounters:  02/05/21 136 lb (61.7 kg)  11/27/20 133 lb (60.3 kg)  10/01/20 133 lb 9.6 oz (60.6 kg)    Physical Exam Vitals reviewed.  Constitutional:      General: She is not in acute distress.    Appearance: Normal appearance.  HENT:     Head: Normocephalic and atraumatic.     Right Ear: External ear normal.     Left Ear: External ear normal.  Eyes:     General: No scleral icterus.       Right eye: No discharge.        Left eye: No discharge.     Conjunctiva/sclera: Conjunctivae normal.  Neck:     Thyroid: No thyromegaly.  Cardiovascular:     Rate and Rhythm: Normal rate and regular rhythm.  Pulmonary:     Effort: No respiratory distress.     Breath sounds: Normal breath sounds. No wheezing.  Abdominal:     General: Bowel sounds are normal.     Palpations: Abdomen is soft.     Tenderness: There is no abdominal tenderness.   Musculoskeletal:        General: No swelling or tenderness.     Cervical back: Neck supple. No tenderness.  Lymphadenopathy:     Cervical: No cervical adenopathy.  Skin:  Findings: No erythema or rash.  Neurological:     Mental Status: She is alert.  Psychiatric:        Mood and Affect: Mood normal.        Behavior: Behavior normal.     Outpatient Encounter Medications as of 02/05/2021  Medication Sig   ACCU-CHEK AVIVA PLUS test strip USE AS INSTRUCTED TO CHECK BLOOD SUGAR ONCE DAILY   Accu-Chek Softclix Lancets lancets USE AS INSTRUCTED TO CHECK BLOOD SUGAR ONCE DAILY   amLODipine (NORVASC) 5 MG tablet TAKE 1 TABLET EVERY DAY   aspirin 81 MG tablet Take 81 mg by mouth daily.   Calcium Carbonate-Vitamin D 600-400 MG-UNIT chew tablet Chew 1 tablet by mouth 2 (two) times daily.   carbamide peroxide (DEBROX) 6.5 % OTIC solution 4-5 drops in both ears daily.  Massage for approximately 5 minutes daily.   cetirizine (ZYRTEC) 10 MG tablet Take 10 mg by mouth daily.   Chromium-Cinnamon (CINNAMON PLUS CHROMIUM PO) Take by mouth.   dorzolamide-timolol (COSOPT) 22.3-6.8 MG/ML ophthalmic solution Place 1 drop into the left eye 2 (two) times daily.    gabapentin (NEURONTIN) 100 MG capsule TAKE 1 CAPSULE EVERY MORNING AND TAKE 2 CAPSULES AT BEDTIME.   latanoprost (XALATAN) 0.005 % ophthalmic solution Place 1 drop into the left eye at bedtime.   levothyroxine (SYNTHROID) 50 MCG tablet TAKE 1 TABLET EVERY DAY   Multiple Vitamins-Minerals (PRESERVISION AREDS 2) CHEW Chew 2 capsules by mouth.   omeprazole (PRILOSEC) 20 MG capsule TAKE 1 CAPSULE TWICE DAILY   rosuvastatin (CRESTOR) 20 MG tablet TAKE 1 TABLET EVERY DAY   temazepam (RESTORIL) 30 MG capsule Take 1 capsule (30 mg total) by mouth at bedtime as needed.   Blood Glucose Monitoring Suppl (ACCU-CHEK AVIVA) device Use as instructed   No facility-administered encounter medications on file as of 02/05/2021.     Lab Results  Component  Value Date   WBC 7.3 01/31/2021   HGB 13.3 01/31/2021   HCT 40.8 01/31/2021   PLT 228.0 01/31/2021   GLUCOSE 108 (H) 01/31/2021   CHOL 181 01/31/2021   TRIG 76.0 01/31/2021   HDL 78.40 01/31/2021   LDLCALC 87 01/31/2021   ALT 15 01/31/2021   AST 20 01/31/2021   NA 141 01/31/2021   K 4.1 01/31/2021   CL 104 01/31/2021   CREATININE 0.79 01/31/2021   BUN 13 01/31/2021   CO2 31 01/31/2021   TSH 1.61 01/31/2021   HGBA1C 6.5 01/31/2021   MICROALBUR <0.7 01/31/2021    MM 3D SCREEN BREAST UNI LEFT  Result Date: 01/21/2021 CLINICAL DATA:  Screening. EXAM: DIGITAL SCREENING UNILATERAL LEFT MAMMOGRAM WITH CAD AND TOMOSYNTHESIS TECHNIQUE: Left screening digital craniocaudal and mediolateral oblique mammograms were obtained. Left screening digital breast tomosynthesis was performed. The images were evaluated with computer-aided detection. COMPARISON:  Previous exam(s). ACR Breast Density Category b: There are scattered areas of fibroglandular density. FINDINGS: The patient has had a right mastectomy. There are no findings suspicious for malignancy. IMPRESSION: No mammographic evidence of malignancy. A result letter of this screening mammogram will be mailed directly to the patient. RECOMMENDATION: Screening mammogram in one year.  (Code:SM-L-40M) BI-RADS CATEGORY  1: Negative. Electronically Signed   By: Lillia Mountain M.D.   On: 01/21/2021 12:54      Assessment & Plan:   Problem List Items Addressed This Visit     History of breast cancer    Mammogram 01/21/21 - Birads I.       Hypercholesterolemia  Continue crestor.  Low cholesterol diet and exercise.  Follow lipid panel and liver function tests.        Relevant Orders   Hepatic function panel   Lipid panel   Hypertension    Continue amlodipine. Follow pressures.  Follow metabolic panel.       Hypothyroidism    On thyroid replacement.  Follow tsh.        Peripheral neuropathy    On gabapentin.  Stable.        Right knee  pain    Right knee pain as outlined.  Once moving - better.  Desires no further intervention.  Follow.       Stress    Has good support.  Stable.  Has restoril if needed.       Type 2 diabetes mellitus with diabetic polyneuropathy, without long-term current use of insulin (HCC)    Outside sugars reviewed.  Doing well.  Continue low carb diet and exercise.  Follow met b and a1c. Lab Results  Component Value Date   HGBA1C 6.5 01/31/2021         Relevant Orders   Hemoglobin U7M   Basic metabolic panel   Other Visit Diagnoses     Need for immunization against influenza    -  Primary   Relevant Orders   Flu Vaccine QUAD High Dose(Fluad) (Completed)        Einar Pheasant, MD

## 2021-02-17 ENCOUNTER — Encounter: Payer: Self-pay | Admitting: Internal Medicine

## 2021-02-17 DIAGNOSIS — M25561 Pain in right knee: Secondary | ICD-10-CM | POA: Insufficient documentation

## 2021-02-17 NOTE — Assessment & Plan Note (Signed)
Continue crestor.  Low cholesterol diet and exercise. Follow lipid panel and liver function tests.   

## 2021-02-17 NOTE — Assessment & Plan Note (Signed)
On gabapentin.  Stable.  

## 2021-02-17 NOTE — Assessment & Plan Note (Signed)
Continue amlodipine.  Follow pressures.  Follow metabolic panel.   

## 2021-02-17 NOTE — Assessment & Plan Note (Signed)
Mammogram 01/21/21 - Birads I.  

## 2021-02-17 NOTE — Assessment & Plan Note (Signed)
On thyroid replacement.  Follow tsh.  

## 2021-02-17 NOTE — Assessment & Plan Note (Signed)
Has good support.  Stable.  Has restoril if needed.

## 2021-02-17 NOTE — Assessment & Plan Note (Signed)
Outside sugars reviewed.  Doing well.  Continue low carb diet and exercise.  Follow met b and a1c. Lab Results  Component Value Date   HGBA1C 6.5 01/31/2021

## 2021-02-17 NOTE — Assessment & Plan Note (Signed)
Right knee pain as outlined.  Once moving - better.  Desires no further intervention.  Follow.

## 2021-03-01 DIAGNOSIS — Z0100 Encounter for examination of eyes and vision without abnormal findings: Secondary | ICD-10-CM | POA: Diagnosis not present

## 2021-03-01 DIAGNOSIS — H353132 Nonexudative age-related macular degeneration, bilateral, intermediate dry stage: Secondary | ICD-10-CM | POA: Diagnosis not present

## 2021-03-11 ENCOUNTER — Telehealth: Payer: Self-pay | Admitting: Internal Medicine

## 2021-03-11 NOTE — Telephone Encounter (Signed)
RX Refill: restoril Last Seen: 02-05-21 Last Ordered: 07-12-20 Next Appt:06-12-21

## 2021-03-11 NOTE — Telephone Encounter (Incomplete Revision)
Refill on temazepam (RESTORIL) 30 MG capsule Send to [facility]

## 2021-03-11 NOTE — Telephone Encounter (Signed)
The note states send rx to [facility].  We do not have this listed as a pharmacy. Please confirm pharmacy and add preferred pharmacy to her chart

## 2021-03-11 NOTE — Telephone Encounter (Addendum)
Refill on temazepam (RESTORIL) 30 MG capsule Send to [facility]

## 2021-03-12 MED ORDER — TEMAZEPAM 30 MG PO CAPS: 30.0000 mg | ORAL_CAPSULE | Freq: Every evening | ORAL | 0 refills | Status: DC | PRN

## 2021-03-12 NOTE — Telephone Encounter (Signed)
[employer] has bought out [employer]. [facility] is what she means.

## 2021-03-12 NOTE — Telephone Encounter (Signed)
Rx ok'd for temazepam 30 with no refills.

## 2021-03-17 ENCOUNTER — Other Ambulatory Visit: Payer: Self-pay | Admitting: Internal Medicine

## 2021-03-17 DIAGNOSIS — E1142 Type 2 diabetes mellitus with diabetic polyneuropathy: Secondary | ICD-10-CM

## 2021-03-26 ENCOUNTER — Telehealth: Payer: Self-pay | Admitting: Internal Medicine

## 2021-03-26 NOTE — Telephone Encounter (Signed)
Patient is requesting a refill on her gabapentin (NEURONTIN) 100 MG capsule

## 2021-03-27 MED ORDER — GABAPENTIN 100 MG PO CAPS: ORAL_CAPSULE | ORAL | 1 refills | Status: DC

## 2021-03-27 NOTE — Telephone Encounter (Signed)
Refill sent to mail order. 

## 2021-03-27 NOTE — Addendum Note (Signed)
Addended by: Nanci Pina on: 03/27/2021 10:29 AM   Modules accepted: Orders

## 2021-05-06 ENCOUNTER — Telehealth: Payer: Self-pay | Admitting: Internal Medicine

## 2021-05-06 NOTE — Telephone Encounter (Signed)
Pt is having an issue with her Aviva meter Pt states her meter is reading low battery and she is unsure if she should get a new battery or does she need a new meter. Please give pt a call back

## 2021-05-07 ENCOUNTER — Other Ambulatory Visit: Payer: Self-pay

## 2021-05-07 DIAGNOSIS — E1142 Type 2 diabetes mellitus with diabetic polyneuropathy: Secondary | ICD-10-CM

## 2021-05-07 MED ORDER — ACCU-CHEK AVIVA PLUS W/DEVICE KIT
1.0000 | PACK | Freq: Every day | 0 refills | Status: AC | PRN
Start: 2021-05-07 — End: ?

## 2021-05-07 NOTE — Telephone Encounter (Signed)
I spoke with patient & she stated that Accu Chek Aviva Plus was giving her a low battery message. She was unsure how long she has meter, so I advised that I could send a new one if it had been some time since that one was sent. She asked that I send to [facility] I have done so for patient. She stated that she did not need lancets or strips at this time.

## 2021-05-13 NOTE — Telephone Encounter (Signed)
Patient called and said her pharmacy has not filled her Accu Check meter because the pharmacy is waiting for information from office. Patient was not sure what the information is needed.

## 2021-05-15 NOTE — Telephone Encounter (Signed)
Advised that script was sent in. Patient replaced the batteries in her machine and continued using. If she does not receive her new on by Monday pt is going to let me know.

## 2021-05-16 NOTE — Telephone Encounter (Signed)
Pt called in stating she received her Accu check meter and she said its a little different but if she need help she will come by the office

## 2021-05-16 NOTE — Telephone Encounter (Signed)
Roxie C From [place] called in stating that the pharmacy never received the script for Accu Check Guide Strips for the Pt. Roxie C is requesting for a script. Roxie number to call her if any questions is [phone]

## 2021-05-16 NOTE — Telephone Encounter (Signed)
Patient will bring meter to next appt if questions

## 2021-05-29 ENCOUNTER — Other Ambulatory Visit: Payer: Self-pay | Admitting: Internal Medicine

## 2021-06-10 ENCOUNTER — Other Ambulatory Visit: Payer: Medicare HMO

## 2021-06-12 ENCOUNTER — Encounter: Payer: Medicare HMO | Admitting: Internal Medicine

## 2021-06-22 ENCOUNTER — Other Ambulatory Visit: Payer: Self-pay | Admitting: Internal Medicine

## 2021-06-27 ENCOUNTER — Telehealth: Payer: Self-pay | Admitting: Internal Medicine

## 2021-06-27 NOTE — Telephone Encounter (Signed)
Patient is requesting the following refills; temazepam (RESTORIL) 30 MG capsule and gabapentin (NEURONTIN) 100 MG capsule. ?

## 2021-06-28 ENCOUNTER — Other Ambulatory Visit: Payer: Self-pay

## 2021-06-28 MED ORDER — GABAPENTIN 100 MG PO CAPS: ORAL_CAPSULE | ORAL | 1 refills | Status: DC

## 2021-06-28 NOTE — Telephone Encounter (Signed)
I refilled her gabapentin but needs to fill her temazepam. Last refilled 03/20/21 and has appt with you 3/28 ?

## 2021-06-29 MED ORDER — TEMAZEPAM 30 MG PO CAPS: 30.0000 mg | ORAL_CAPSULE | Freq: Every evening | ORAL | 0 refills | Status: DC | PRN

## 2021-06-29 NOTE — Addendum Note (Signed)
Addended by: Alisa Graff on: 06/29/2021 11:19 AM ? ? Modules accepted: Orders ? ?

## 2021-06-29 NOTE — Telephone Encounter (Signed)
Rx ok'd for temazepam #30 with no refill.  I sent to mail order since that was the pharmacy marked.  Please confirm with pt that is where she wanted it sent.  ?

## 2021-07-09 ENCOUNTER — Other Ambulatory Visit (INDEPENDENT_AMBULATORY_CARE_PROVIDER_SITE_OTHER): Payer: Medicare HMO

## 2021-07-09 ENCOUNTER — Other Ambulatory Visit: Payer: Self-pay

## 2021-07-09 DIAGNOSIS — E1142 Type 2 diabetes mellitus with diabetic polyneuropathy: Secondary | ICD-10-CM

## 2021-07-09 DIAGNOSIS — E7800 Pure hypercholesterolemia, unspecified: Secondary | ICD-10-CM

## 2021-07-09 LAB — BASIC METABOLIC PANEL
BUN: 14 mg/dL (ref 6–23)
CO2: 28 mEq/L (ref 19–32)
Calcium: 10 mg/dL (ref 8.4–10.5)
Chloride: 101 mEq/L (ref 96–112)
Creatinine, Ser: 0.82 mg/dL (ref 0.40–1.20)
GFR: 63.27 mL/min (ref >60.00)
Glucose, Bld: 112 mg/dL — ABNORMAL HIGH (ref 70–99)
Potassium: 4.3 mEq/L (ref 3.5–5.1)
Sodium: 138 mEq/L (ref 135–145)

## 2021-07-09 LAB — HEMOGLOBIN A1C: Hgb A1c MFr Bld: 6.6 % — ABNORMAL HIGH (ref 4.6–6.5)

## 2021-07-09 LAB — HEPATIC FUNCTION PANEL
ALT: 14 U/L (ref 0–35)
AST: 18 U/L (ref 0–37)
Albumin: 4.5 g/dL (ref 3.5–5.2)
Alkaline Phosphatase: 65 U/L (ref 39–117)
Bilirubin, Direct: 0.1 mg/dL (ref 0.0–0.3)
Total Bilirubin: 0.7 mg/dL (ref 0.2–1.2)
Total Protein: 7.4 g/dL (ref 6.0–8.3)

## 2021-07-09 LAB — LIPID PANEL
Cholesterol: 186 mg/dL (ref 0–200)
HDL: 78.5 mg/dL (ref >39.00)
LDL Cholesterol: 93 mg/dL (ref 0–99)
NonHDL: 107.83
Total CHOL/HDL Ratio: 2
Triglycerides: 72 mg/dL (ref 0.0–149.0)
VLDL: 14.4 mg/dL (ref 0.0–40.0)

## 2021-07-15 ENCOUNTER — Other Ambulatory Visit: Payer: Self-pay

## 2021-07-15 MED ORDER — GABAPENTIN 100 MG PO CAPS: ORAL_CAPSULE | ORAL | 1 refills | Status: DC

## 2021-07-15 NOTE — Telephone Encounter (Signed)
Patient called and said that her  pharmacy still has not received a prescription to refill her gabapentin (NEURONTIN) 100 MG capsule. Patient has 1w remaining. ?

## 2021-07-16 ENCOUNTER — Ambulatory Visit (INDEPENDENT_AMBULATORY_CARE_PROVIDER_SITE_OTHER): Payer: Medicare HMO | Admitting: Internal Medicine

## 2021-07-16 ENCOUNTER — Other Ambulatory Visit: Payer: Self-pay

## 2021-07-16 ENCOUNTER — Encounter: Payer: Self-pay | Admitting: Internal Medicine

## 2021-07-16 ENCOUNTER — Ambulatory Visit (INDEPENDENT_AMBULATORY_CARE_PROVIDER_SITE_OTHER): Payer: Medicare HMO

## 2021-07-16 VITALS — BP 124/80 | HR 62 | Temp 98.0°F | Ht 64.0 in | Wt 131.6 lb

## 2021-07-16 DIAGNOSIS — E1142 Type 2 diabetes mellitus with diabetic polyneuropathy: Secondary | ICD-10-CM

## 2021-07-16 DIAGNOSIS — E7800 Pure hypercholesterolemia, unspecified: Secondary | ICD-10-CM | POA: Diagnosis not present

## 2021-07-16 DIAGNOSIS — R1310 Dysphagia, unspecified: Secondary | ICD-10-CM | POA: Diagnosis not present

## 2021-07-16 DIAGNOSIS — Z0000 Encounter for general adult medical examination without abnormal findings: Secondary | ICD-10-CM | POA: Diagnosis not present

## 2021-07-16 DIAGNOSIS — E039 Hypothyroidism, unspecified: Secondary | ICD-10-CM | POA: Diagnosis not present

## 2021-07-16 DIAGNOSIS — K2100 Gastro-esophageal reflux disease with esophagitis, without bleeding: Secondary | ICD-10-CM | POA: Diagnosis not present

## 2021-07-16 DIAGNOSIS — G6289 Other specified polyneuropathies: Secondary | ICD-10-CM

## 2021-07-16 DIAGNOSIS — M545 Low back pain, unspecified: Secondary | ICD-10-CM

## 2021-07-16 DIAGNOSIS — H6123 Impacted cerumen, bilateral: Secondary | ICD-10-CM

## 2021-07-16 DIAGNOSIS — S32010A Wedge compression fracture of first lumbar vertebra, initial encounter for closed fracture: Secondary | ICD-10-CM | POA: Insufficient documentation

## 2021-07-16 DIAGNOSIS — I10 Essential (primary) hypertension: Secondary | ICD-10-CM

## 2021-07-16 DIAGNOSIS — Z853 Personal history of malignant neoplasm of breast: Secondary | ICD-10-CM

## 2021-07-16 DIAGNOSIS — G479 Sleep disorder, unspecified: Secondary | ICD-10-CM

## 2021-07-16 NOTE — Assessment & Plan Note (Signed)
Physical today 07/16/21.  S/p right mastectomy.  Left mammogram 01/21/21 - Birads I. colonoscopy 08/2009.   ?

## 2021-07-16 NOTE — Progress Notes (Signed)
Patient ID: Julie Hoover, female   DOB: 1929-10-29, 88 y.o.   MRN: [id number] ? ? ?Subjective:  ? ? Patient ID: Julie Hoover, female    DOB: 08/05/1929, 88 y.o.   MRN: [id number] ? ?This visit occurred during the SARS-CoV-2 public health emergency.  Safety protocols were in place, including screening questions prior to the visit, additional usage of staff PPE, and extensive cleaning of exam room while observing appropriate contact time as indicated for disinfecting solutions.  ? ?Patient here for her physical exam.  ? ?Chief Complaint  ?Patient presents with  ? Follow-up  ?  CPE  ? .  ? ?HPI ?States she is doing relatively well. Has noticed low back pain.  Increased pain with changing sheets, bending, etc.  Has been present for the last few months.  Limits activity.  No radicular symptoms.  No loss of control of bowel or bladder.  Did have a previous fall.  Did not pick up her foot to step and fell.  No LOC.  No head injury.  Taking tylenol.  Helps.  Taking omeprazole.  Some decreased appetite.  Has lost weight.  Drinking prune juice.  Helps bowels to movie.  Also taking probiotic gummies.  Some dysphagia - states certain foods stick in her throat - specifically bread.  Has been using debrox the last few days.  Wants ears checked.  Discussed sleep.  Discussed adjusting timing of neurontin.   ? ? ?Past Medical History:  ?Diagnosis Date  ? Arthritis   ? Breast cancer ([place]) 1986  ? s/p right lumpectomy, XRT and Tamoxifen, mastectomy  ? Cancer [facility])   ? skin ca  ? Chicken pox   ? Colon polyps   ? Diabetes ([place])   ? Diverticulosis   ? Environmental allergies   ? GERD (gastroesophageal reflux disease)   ? Glaucoma   ? Hypercholesterolemia   ? Hyperglycemia   ? Hypertension   ? Hypothyroidism   ? Osteopenia   ? Peripheral neuropathy   ? Personal history of radiation therapy 1986  ? Urethral stricture   ? ?Past Surgical History:  ?Procedure Laterality Date  ? BREAST BIOPSY Right 6/86  ?  lumpectomy-positive/rad  ? BREAST LUMPECTOMY  1986  ? right  ? ESOPHAGOGASTRODUODENOSCOPY (EGD) WITH PROPOFOL N/A 12/17/2015  ? Procedure: ESOPHAGOGASTRODUODENOSCOPY (EGD) WITH PROPOFOL;  Surgeon: Lollie Sails, MD;  Location: [facility] ENDOSCOPY;  Service: Endoscopy;  Laterality: N/A;  ? EYE SURGERY    ? Bilateral cataracts removed  ? KNEE SURGERY  06/2008  ? torn meniscus, right  ? KNEE SURGERY  2012  ? torn menisucs, right  ? MASTECTOMY Right 1993  ? right, recurrence  ? TUBAL LIGATION    ? URETHRAL DILATION    ? ?Family History  ?Problem Relation Age of Onset  ? Heart disease Father   ? Hypertension Father   ? COPD Mother   ? Hypertension Brother   ?     x2  ? Hypercholesterolemia Brother   ?     x2  ? Hypertension Sister   ? Hypercholesterolemia Sister   ? Diabetes Mellitus II Sister   ? Cancer Other   ?     mouth, aunt  ? Colon cancer Neg Hx   ? Breast cancer Neg Hx   ? ?Social History  ? ?Socioeconomic History  ? Marital status: Widowed  ?  Spouse name: Not on file  ? Number of children: 2  ? Years of education: Not on file  ?  Highest education level: Not on file  ?Occupational History  ? Not on file  ?Tobacco Use  ? Smoking status: Never  ? Smokeless tobacco: Never  ?Substance and Sexual Activity  ? Alcohol use: No  ?  Alcohol/week: 0.0 standard drinks  ? Drug use: No  ? Sexual activity: Never  ?Other Topics Concern  ? Not on file  ?Social History Narrative  ? Not on file  ? ?Social Determinants of Health  ? ?Financial Resource Strain: Low Risk   ? Difficulty of Paying Living Expenses: Not hard at all  ?Food Insecurity: No Food Insecurity  ? Worried About Charity fundraiser in the Last Year: Never true  ? Ran Out of Food in the Last Year: Never true  ?Transportation Needs: No Transportation Needs  ? Lack of Transportation (Medical): No  ? Lack of Transportation (Non-Medical): No  ?Physical Activity: Not on file  ?Stress: No Stress Concern Present  ? Feeling of Stress : Not at all  ?Social Connections: Unknown   ? Frequency of Communication with Friends and Family: More than three times a week  ? Frequency of Social Gatherings with Friends and Family: More than three times a week  ? Attends Religious Services: Not on file  ? Active Member of Clubs or Organizations: Not on file  ? Attends Archivist Meetings: Not on file  ? Marital Status: Widowed  ? ? ? ?Review of Systems  ?Constitutional:   ?     Decreased appetite.  Weight loss.    ?HENT:  Negative for congestion and sinus pressure.   ?Respiratory:  Negative for cough, chest tightness and shortness of breath.   ?Cardiovascular:  Negative for chest pain, palpitations and leg swelling.  ?Gastrointestinal:  Positive for constipation. Negative for abdominal pain, nausea and vomiting.  ?Genitourinary:  Negative for difficulty urinating and dysuria.  ?Musculoskeletal:  Positive for back pain. Negative for joint swelling and myalgias.  ?Skin:  Negative for color change and rash.  ?Neurological:  Negative for dizziness, light-headedness and headaches.  ?Psychiatric/Behavioral:  Positive for sleep disturbance. Negative for agitation and dysphoric mood.   ? ?   ?Objective:  ?  ? ?BP 124/80 (BP Location: Left Arm, Patient Position: Sitting, Cuff Size: Small)   Pulse 62   Temp 98 ?F (36.7 ?C) (Oral)   Ht $R'5\' 4"'uO$  (1.626 m)   Wt 131 lb 9.6 oz (59.7 kg)   SpO2 95%   BMI 22.59 kg/m?  ?Wt Readings from Last 3 Encounters:  ?07/16/21 131 lb 9.6 oz (59.7 kg)  ?02/05/21 136 lb (61.7 kg)  ?11/27/20 133 lb (60.3 kg)  ? ? ?Physical Exam ?Vitals reviewed.  ?Constitutional:   ?   General: She is not in acute distress. ?   Appearance: Normal appearance.  ?HENT:  ?   Head: Normocephalic and atraumatic.  ?   Right Ear: External ear normal.  ?   Left Ear: External ear normal.  ?Eyes:  ?   General: No scleral icterus.    ?   Right eye: No discharge.     ?   Left eye: No discharge.  ?   Conjunctiva/sclera: Conjunctivae normal.  ?Neck:  ?   Thyroid: No thyromegaly.  ?Cardiovascular:  ?    Rate and Rhythm: Normal rate and regular rhythm.  ?Pulmonary:  ?   Effort: No respiratory distress.  ?   Breath sounds: Normal breath sounds. No wheezing.  ?Abdominal:  ?   General: Bowel sounds are normal.  ?  Palpations: Abdomen is soft.  ?   Tenderness: There is no abdominal tenderness.  ?Musculoskeletal:     ?   General: No swelling or tenderness.  ?   Cervical back: Neck supple. No tenderness.  ?   Comments: Increased low back pain - with bending.    ?Lymphadenopathy:  ?   Cervical: No cervical adenopathy.  ?Skin: ?   Findings: No erythema or rash.  ?Neurological:  ?   Mental Status: She is alert.  ?Psychiatric:     ?   Mood and Affect: Mood normal.     ?   Behavior: Behavior normal.  ? ? ? ?Outpatient Encounter Medications as of 07/16/2021  ?Medication Sig  ? ACCU-CHEK AVIVA PLUS test strip USE AS INSTRUCTED TO CHECK BLOOD SUGAR ONCE DAILY  ? Accu-Chek Softclix Lancets lancets USE AS INSTRUCTED TO CHECK BLOOD SUGAR ONCE DAILY  ? amLODipine (NORVASC) 5 MG tablet TAKE 1 TABLET EVERY DAY  ? aspirin 81 MG tablet Take 81 mg by mouth daily.  ? Blood Glucose Monitoring Suppl (ACCU-CHEK AVIVA PLUS) w/Device KIT 1 Device by Does not apply route daily as needed.  ? Calcium Carbonate-Vitamin D 600-400 MG-UNIT chew tablet Chew 1 tablet by mouth 2 (two) times daily.  ? carbamide peroxide (DEBROX) 6.5 % OTIC solution 4-5 drops in both ears daily.  Massage for approximately 5 minutes daily.  ? cetirizine (ZYRTEC) 10 MG tablet Take 10 mg by mouth daily.  ? Chromium-Cinnamon (CINNAMON PLUS CHROMIUM PO) Take by mouth.  ? dorzolamide-timolol (COSOPT) 22.3-6.8 MG/ML ophthalmic solution Place 1 drop into the left eye 2 (two) times daily.   ? gabapentin (NEURONTIN) 100 MG capsule TAKE 1 CAPSULE EVERY MORNING AND TAKE 2 CAPSULES AT BEDTIME.  ? latanoprost (XALATAN) 0.005 % ophthalmic solution Place 1 drop into the left eye at bedtime.  ? levothyroxine (SYNTHROID) 50 MCG tablet TAKE 1 TABLET EVERY DAY  ? Multiple Vitamins-Minerals  (PRESERVISION AREDS 2) CHEW Chew 2 capsules by mouth.  ? omeprazole (PRILOSEC) 20 MG capsule TAKE 1 CAPSULE TWICE DAILY  ? rosuvastatin (CRESTOR) 20 MG tablet TAKE 1 TABLET EVERY DAY  ? temazepam (RESTORIL) 30 MG capsule Take 1 ca

## 2021-07-18 ENCOUNTER — Other Ambulatory Visit: Payer: Self-pay

## 2021-07-18 DIAGNOSIS — M545 Low back pain, unspecified: Secondary | ICD-10-CM

## 2021-07-18 NOTE — Assessment & Plan Note (Signed)
On protonix.  Dysphagia as outlined.  Discussed further evaluation, including swallowing evaluation and referral back to GI.  Declines.  Wants to follow.   ?

## 2021-07-18 NOTE — Assessment & Plan Note (Signed)
No impaction today.  Has been using debrox.  Follow  ?

## 2021-07-18 NOTE — Assessment & Plan Note (Signed)
On thyroid replacement.  Follow tsh.  

## 2021-07-18 NOTE — Assessment & Plan Note (Signed)
Continue crestor.  Low cholesterol diet and exercise. Follow lipid panel and liver function tests.   

## 2021-07-18 NOTE — Assessment & Plan Note (Signed)
On gabapentin.  Will adjust timing of dosing to help with sleep.  ?

## 2021-07-18 NOTE — Assessment & Plan Note (Signed)
Continue amlodipine.  Follow pressures.  Follow metabolic panel.   

## 2021-07-18 NOTE — Assessment & Plan Note (Signed)
Outside sugars reviewed.  Doing well.  Continue low carb diet and exercise.  Follow met b and a1c. ?Lab Results  ?Component Value Date  ? HGBA1C 6.6 (H) 07/09/2021  ?  ?

## 2021-07-18 NOTE — Assessment & Plan Note (Signed)
Persistent low back pain as outlined.  No radicular symptoms.  Check xray.  Continue tylenol.  ?

## 2021-07-18 NOTE — Assessment & Plan Note (Signed)
On protonix.  Dysphagia as outlined.  Discussed further evaluation, including swallowing evaluation and referral back to GI.  Declines.  Wants to follow.  Previously saw GI.  Zenker's diverticulum.  Wants to hold on further intervention.  ?

## 2021-07-18 NOTE — Assessment & Plan Note (Signed)
Mammogram 01/21/21 - Birads I.  

## 2021-07-18 NOTE — Assessment & Plan Note (Signed)
Discussed.  Will adjust timing of gabapentin dosing first.  If no improvement, consider trial of remeron.   ?

## 2021-07-19 DIAGNOSIS — H353132 Nonexudative age-related macular degeneration, bilateral, intermediate dry stage: Secondary | ICD-10-CM | POA: Diagnosis not present

## 2021-07-25 ENCOUNTER — Ambulatory Visit: Payer: Medicare HMO | Admitting: Physical Therapy

## 2021-07-29 ENCOUNTER — Encounter: Payer: Medicare HMO | Admitting: Physical Therapy

## 2021-07-29 DIAGNOSIS — L430 Hypertrophic lichen planus: Secondary | ICD-10-CM | POA: Diagnosis not present

## 2021-07-29 DIAGNOSIS — Z859 Personal history of malignant neoplasm, unspecified: Secondary | ICD-10-CM | POA: Diagnosis not present

## 2021-07-29 DIAGNOSIS — L578 Other skin changes due to chronic exposure to nonionizing radiation: Secondary | ICD-10-CM | POA: Diagnosis not present

## 2021-07-29 DIAGNOSIS — D485 Neoplasm of uncertain behavior of skin: Secondary | ICD-10-CM | POA: Diagnosis not present

## 2021-07-31 ENCOUNTER — Encounter: Payer: Medicare HMO | Admitting: Physical Therapy

## 2021-08-05 ENCOUNTER — Encounter: Payer: Medicare HMO | Admitting: Physical Therapy

## 2021-08-05 ENCOUNTER — Telehealth: Payer: Self-pay | Admitting: Internal Medicine

## 2021-08-05 NOTE — Telephone Encounter (Signed)
Pt advised to try Tylenol Arthritis 2 pills BID , encouraged to reschedule with orthopedic  ?

## 2021-08-05 NOTE — Telephone Encounter (Signed)
Pt called in stating that she saw Dr. Nicki Reaper at the end of March... Pt stated that Dr. Nicki Reaper referred her to an orthopedic... Pt stated that she had an appt but had to cancel the appt... Pt is requesting for Dr. Nicki Reaper to prescribe her medication or can Dr. Nicki Reaper advise her of what OTC she can take... Pt requesting callback...  ?

## 2021-08-07 ENCOUNTER — Encounter: Payer: Self-pay | Admitting: Internal Medicine

## 2021-08-07 ENCOUNTER — Telehealth: Payer: Self-pay | Admitting: Internal Medicine

## 2021-08-07 ENCOUNTER — Encounter: Payer: Medicare HMO | Admitting: Physical Therapy

## 2021-08-07 ENCOUNTER — Other Ambulatory Visit: Payer: Self-pay

## 2021-08-07 ENCOUNTER — Ambulatory Visit (INDEPENDENT_AMBULATORY_CARE_PROVIDER_SITE_OTHER): Payer: Medicare HMO | Admitting: Internal Medicine

## 2021-08-07 ENCOUNTER — Ambulatory Visit (INDEPENDENT_AMBULATORY_CARE_PROVIDER_SITE_OTHER): Payer: Medicare HMO

## 2021-08-07 VITALS — BP 122/60 | HR 68 | Ht 64.0 in | Wt 131.4 lb

## 2021-08-07 DIAGNOSIS — K921 Melena: Secondary | ICD-10-CM | POA: Diagnosis not present

## 2021-08-07 DIAGNOSIS — M25552 Pain in left hip: Secondary | ICD-10-CM

## 2021-08-07 DIAGNOSIS — R300 Dysuria: Secondary | ICD-10-CM | POA: Diagnosis not present

## 2021-08-07 DIAGNOSIS — R1084 Generalized abdominal pain: Secondary | ICD-10-CM | POA: Diagnosis not present

## 2021-08-07 DIAGNOSIS — M5416 Radiculopathy, lumbar region: Secondary | ICD-10-CM

## 2021-08-07 DIAGNOSIS — W19XXXA Unspecified fall, initial encounter: Secondary | ICD-10-CM | POA: Diagnosis not present

## 2021-08-07 DIAGNOSIS — K5900 Constipation, unspecified: Secondary | ICD-10-CM | POA: Diagnosis not present

## 2021-08-07 DIAGNOSIS — M47816 Spondylosis without myelopathy or radiculopathy, lumbar region: Secondary | ICD-10-CM | POA: Diagnosis not present

## 2021-08-07 LAB — HEMOCCULT GUIAC POC 1CARD (OFFICE): Fecal Occult Blood, POC: NEGATIVE

## 2021-08-07 MED ORDER — TRAMADOL HCL 50 MG PO TABS: 50.0000 mg | ORAL_TABLET | Freq: Two times a day (BID) | ORAL | 0 refills | Status: DC | PRN

## 2021-08-07 MED ORDER — METHYLPREDNISOLONE ACETATE 40 MG/ML IJ SUSP
40.0000 mg | Freq: Once | INTRAMUSCULAR | Status: AC
  Administered 2021-08-07: 40 mg via INTRAMUSCULAR

## 2021-08-07 NOTE — Patient Instructions (Addendum)
Get online care: [email or web address] ?Address: [address], [place], [place] [address] ?Hours:  ?Closes soon ? 12?PM ? Reopens 1?PM ?Phone: [phone] ? ?You can try miralax 1-2 capfuls in 8 ounces of liquid or dulcolax PLUS colace (stool softner  ?Prune warm  ? ?Constipation, Adult ?Constipation is when a person has fewer than three bowel movements in a week, has difficulty having a bowel movement, or has stools (feces) that are dry, hard, or larger than normal. Constipation may be caused by an underlying condition. It may become worse with age if a person takes certain medicines and does not take in enough fluids. ?Follow these instructions at home: ?Eating and drinking ? ?Eat foods that have a lot of fiber, such as beans, whole grains, and fresh fruits and vegetables. ?Limit foods that are low in fiber and high in fat and processed sugars, such as fried or sweet foods. These include french fries, hamburgers, cookies, candies, and soda. ?Drink enough fluid to keep your urine pale yellow. ?General instructions ?Exercise regularly or as told by your health care provider. Try to do 150 minutes of moderate exercise each week. ?Use the bathroom when you have the urge to go. Do not hold it in. ?Take over-the-counter and prescription medicines only as told by your health care provider. This includes any fiber supplements. ?During bowel movements: ?Practice deep breathing while relaxing the lower abdomen. ?Practice pelvic floor relaxation. ?Watch your condition for any changes. Let your health care provider know about them. ?Keep all follow-up visits as told by your health care provider. This is important. ?Contact a health care provider if: ?You have pain that gets worse. ?You have a fever. ?You do not have a bowel movement after 4 days. ?You vomit. ?You are not hungry or you lose weight. ?You are bleeding from the opening between the buttocks (anus). ?You have thin, pencil-like stools. ?Get help right away if: ?You  have a fever and your symptoms suddenly get worse. ?You leak stool or have blood in your stool. ?Your abdomen is bloated. ?You have severe pain in your abdomen. ?You feel dizzy or you faint. ?Summary ?Constipation is when a person has fewer than three bowel movements in a week, has difficulty having a bowel movement, or has stools (feces) that are dry, hard, or larger than normal. ?Eat foods that have a lot of fiber, such as beans, whole grains, and fresh fruits and vegetables. ?Drink enough fluid to keep your urine pale yellow. ?Take over-the-counter and prescription medicines only as told by your health care provider. This includes any fiber supplements. ?This information is not intended to replace advice given to you by your health care provider. Make sure you discuss any questions you have with your health care provider. ?Document Revised: 02/23/2019 Document Reviewed: 02/23/2019 ?Elsevier Patient Education ? [employer]. ? ?

## 2021-08-07 NOTE — Telephone Encounter (Signed)
Sent to Dr Olivia Mackie to resend ?

## 2021-08-07 NOTE — Telephone Encounter (Signed)
Pt called in stating provider she saw today sent the medication to the wrong pharmacy. It is suppose to be at [facility] in [place]  ?

## 2021-08-07 NOTE — Progress Notes (Signed)
Chief Complaint  ?Patient presents with  ? Back Pain  ?  Pt had fall Monday after Easter. Fell on left hip & pain worsened again in low back.   ? Constipation  ?  Took laxative & yesterday had runny, black stools.   ? Dysuria  ?  With some urine frequency with little output.   ? ?Acute  ?1. Fall Monday after easter and having left hip and low back pain h/o arthritis in low back noted on imaging 06/2021 tried tylenol arthritis ?Did not do pt prev due to does not want to leave the house  ?2. +constipation new and took dulcolax x 2 yesterday or monday now had blood liq stools yesterday. Tried prunce juice ?She reports son and husband died of colon cancer and son had mets to back I.e back pain and this is how it presented which is why she wants evalutation  ?No iron pills or peptobismol ?3. C/o burning with urination and freq ?4. Fall recently rolling trash bin down slanted driveway see #1  ? ? ?Review of Systems  ?Constitutional:  Negative for weight loss.  ?HENT:  Negative for hearing loss.   ?Eyes:  Negative for blurred vision.  ?Respiratory:  Negative for shortness of breath.   ?Cardiovascular:  Negative for chest pain.  ?Gastrointestinal:  Positive for blood in stool and constipation. Negative for abdominal pain.  ?Genitourinary:  Positive for dysuria.  ?Musculoskeletal:  Positive for back pain, falls and joint pain.  ?Skin:  Negative for rash.  ?Neurological:  Negative for headaches.  ?Psychiatric/Behavioral:  Negative for depression.   ?Past Medical History:  ?Diagnosis Date  ? Arthritis   ? Breast cancer ([facility]) 1986  ? s/p right lumpectomy, XRT and Tamoxifen, mastectomy  ? Cancer [facility])   ? skin ca  ? Chicken pox   ? Colon polyps   ? Diabetes ([place])   ? Diverticulosis   ? Environmental allergies   ? GERD (gastroesophageal reflux disease)   ? Glaucoma   ? Hypercholesterolemia   ? Hyperglycemia   ? Hypertension   ? Hypothyroidism   ? Osteopenia   ? Peripheral neuropathy   ? Personal history of radiation therapy 1986   ? Urethral stricture   ? ?Past Surgical History:  ?Procedure Laterality Date  ? BREAST BIOPSY Right 6/86  ? lumpectomy-positive/rad  ? BREAST LUMPECTOMY  1986  ? right  ? ESOPHAGOGASTRODUODENOSCOPY (EGD) WITH PROPOFOL N/A 12/17/2015  ? Procedure: ESOPHAGOGASTRODUODENOSCOPY (EGD) WITH PROPOFOL;  Surgeon: Lollie Sails, MD;  Location: [facility] ENDOSCOPY;  Service: Endoscopy;  Laterality: N/A;  ? EYE SURGERY    ? Bilateral cataracts removed  ? KNEE SURGERY  06/2008  ? torn meniscus, right  ? KNEE SURGERY  2012  ? torn menisucs, right  ? MASTECTOMY Right 1993  ? right, recurrence  ? TUBAL LIGATION    ? URETHRAL DILATION    ? ?Family History  ?Problem Relation Age of Onset  ? Heart disease Father   ? Hypertension Father   ? COPD Mother   ? Hypertension Brother   ?     x2  ? Hypercholesterolemia Brother   ?     x2  ? Hypertension Sister   ? Hypercholesterolemia Sister   ? Diabetes Mellitus II Sister   ? Cancer Other   ?     mouth, aunt  ? Colon cancer Neg Hx   ? Breast cancer Neg Hx   ? ?Social History  ? ?Socioeconomic History  ? Marital status: Widowed  ?  Spouse name: Not on file  ? Number of children: 2  ? Years of education: Not on file  ? Highest education level: Not on file  ?Occupational History  ? Not on file  ?Tobacco Use  ? Smoking status: Never  ? Smokeless tobacco: Never  ?Substance and Sexual Activity  ? Alcohol use: No  ?  Alcohol/week: 0.0 standard drinks  ? Drug use: No  ? Sexual activity: Never  ?Other Topics Concern  ? Not on file  ?Social History Narrative  ? Not on file  ? ?Social Determinants of Health  ? ?Financial Resource Strain: Low Risk   ? Difficulty of Paying Living Expenses: Not hard at all  ?Food Insecurity: No Food Insecurity  ? Worried About Charity fundraiser in the Last Year: Never true  ? Ran Out of Food in the Last Year: Never true  ?Transportation Needs: No Transportation Needs  ? Lack of Transportation (Medical): No  ? Lack of Transportation (Non-Medical): No  ?Physical Activity: Not  on file  ?Stress: No Stress Concern Present  ? Feeling of Stress : Not at all  ?Social Connections: Unknown  ? Frequency of Communication with Friends and Family: More than three times a week  ? Frequency of Social Gatherings with Friends and Family: More than three times a week  ? Attends Religious Services: Not on file  ? Active Member of Clubs or Organizations: Not on file  ? Attends Archivist Meetings: Not on file  ? Marital Status: Widowed  ?Intimate Partner Violence: Not At Risk  ? Fear of Current or Ex-Partner: No  ? Emotionally Abused: No  ? Physically Abused: No  ? Sexually Abused: No  ? ?Current Meds  ?Medication Sig  ? ACCU-CHEK AVIVA PLUS test strip USE AS INSTRUCTED TO CHECK BLOOD SUGAR ONCE DAILY  ? Accu-Chek Softclix Lancets lancets USE AS INSTRUCTED TO CHECK BLOOD SUGAR ONCE DAILY  ? amLODipine (NORVASC) 5 MG tablet TAKE 1 TABLET EVERY DAY  ? aspirin 81 MG tablet Take 81 mg by mouth daily.  ? Blood Glucose Monitoring Suppl (ACCU-CHEK AVIVA PLUS) w/Device KIT 1 Device by Does not apply route daily as needed.  ? Calcium Carbonate-Vitamin D 600-400 MG-UNIT chew tablet Chew 1 tablet by mouth 2 (two) times daily.  ? carbamide peroxide (DEBROX) 6.5 % OTIC solution 4-5 drops in both ears daily.  Massage for approximately 5 minutes daily.  ? cetirizine (ZYRTEC) 10 MG tablet Take 10 mg by mouth daily.  ? Chromium-Cinnamon (CINNAMON PLUS CHROMIUM PO) Take by mouth.  ? dorzolamide-timolol (COSOPT) 22.3-6.8 MG/ML ophthalmic solution Place 1 drop into the left eye 2 (two) times daily.   ? gabapentin (NEURONTIN) 100 MG capsule TAKE 1 CAPSULE EVERY MORNING AND TAKE 2 CAPSULES AT BEDTIME.  ? latanoprost (XALATAN) 0.005 % ophthalmic solution Place 1 drop into the left eye at bedtime.  ? levothyroxine (SYNTHROID) 50 MCG tablet TAKE 1 TABLET EVERY DAY  ? Multiple Vitamins-Minerals (PRESERVISION AREDS 2) CHEW Chew 2 capsules by mouth.  ? omeprazole (PRILOSEC) 20 MG capsule TAKE 1 CAPSULE TWICE DAILY  ?  rosuvastatin (CRESTOR) 20 MG tablet TAKE 1 TABLET EVERY DAY  ? temazepam (RESTORIL) 30 MG capsule Take 1 capsule (30 mg total) by mouth at bedtime as needed.  ? traMADol (ULTRAM) 50 MG tablet Take 1 tablet (50 mg total) by mouth every 12 (twelve) hours as needed for up to 10 days.  ? ?Allergies  ?Allergen Reactions  ? Iodinated Contrast Media   ?  Other reaction(s): Blood Disorder ?Uncoded Allergy. Allergen: ANESTHESIA ENDING IN THANE  ? Ciprofloxacin Other (See Comments)  ?  Intolerance when taking large doses  ? Gentian Root   ?  Blisters ?  ? Halothane Other (See Comments)  ?  Hepatitis and elevated liver enzymes  ? Hydrocodone Itching  ? Norco [Hydrocodone-Acetaminophen] Itching  ? Sulfa Antibiotics Rash  ? ?Recent Results (from the past 2160 hour(s))  ?Lipid panel     Status: None  ? Collection Time: 07/09/21  8:58 AM  ?Result Value Ref Range  ? Cholesterol 186 0 - 200 mg/dL  ?  Comment: ATP III Classification       Desirable:  < 200 mg/dL               Borderline High:  200 - 239 mg/dL          High:  > = 240 mg/dL  ? Triglycerides 72.0 0.0 - 149.0 mg/dL  ?  Comment: Normal:  <150 mg/dLBorderline High:  150 - 199 mg/dL  ? HDL 78.50 >39.00 mg/dL  ? VLDL 14.4 0.0 - 40.0 mg/dL  ? LDL Cholesterol 93 0 - 99 mg/dL  ? Total CHOL/HDL Ratio 2   ?  Comment:                Men          Women1/2 Average Risk     3.4          3.3Average Risk          5.0          4.42X Average Risk          9.6          7.13X Average Risk          15.0          11.0                      ? NonHDL 107.83   ?  Comment: NOTE:  Non-HDL goal should be 30 mg/dL higher than patient's LDL goal (i.e. LDL goal of < 70 mg/dL, would have non-HDL goal of < 100 mg/dL)  ?Basic metabolic panel     Status: Abnormal  ? Collection Time: 07/09/21  8:58 AM  ?Result Value Ref Range  ? Sodium 138 135 - 145 mEq/L  ? Potassium 4.3 3.5 - 5.1 mEq/L  ? Chloride 101 96 - 112 mEq/L  ? CO2 28 19 - 32 mEq/L  ? Glucose, Bld 112 (H) 70 - 99 mg/dL  ? BUN 14 6 - 23 mg/dL  ?  Creatinine, Ser 0.82 0.40 - 1.20 mg/dL  ? GFR 63.27 >60.00 mL/min  ?  Comment: Calculated using the CKD-EPI Creatinine Equation (2021)  ? Calcium 10.0 8.4 - 10.5 mg/dL  ?Hepatic function panel     Status: No

## 2021-08-08 LAB — URINE CULTURE
MICRO NUMBER:: 13284583
Result:: NO GROWTH
SPECIMEN QUALITY:: ADEQUATE

## 2021-08-08 LAB — URINALYSIS, ROUTINE W REFLEX MICROSCOPIC
Bacteria, UA: NONE SEEN /HPF
Bilirubin Urine: NEGATIVE
Glucose, UA: NEGATIVE
Hgb urine dipstick: NEGATIVE
Hyaline Cast: NONE SEEN /LPF
Ketones, ur: NEGATIVE
Nitrite: NEGATIVE
Protein, ur: NEGATIVE
RBC / HPF: NONE SEEN /HPF (ref 0–2)
Specific Gravity, Urine: 1.007 (ref 1.001–1.035)
pH: 5.5 (ref 5.0–8.0)

## 2021-08-08 LAB — MICROSCOPIC MESSAGE

## 2021-08-08 NOTE — Telephone Encounter (Signed)
Pt called in stating does she need take all the pills because it says as needed. Pt said the medication is helping. Pt wants to call back  ?

## 2021-08-09 ENCOUNTER — Telehealth: Payer: Self-pay

## 2021-08-09 NOTE — Telephone Encounter (Signed)
Lvm to inform pt that urine showed no UTI. ?

## 2021-08-09 NOTE — Telephone Encounter (Signed)
Patient stated that the Tramadol is working however it will not last until she has her CT on 08/22/21. Patient requesting more Tramadol to be called into [facility].  ?

## 2021-08-12 ENCOUNTER — Encounter: Payer: Medicare HMO | Admitting: Physical Therapy

## 2021-08-12 ENCOUNTER — Other Ambulatory Visit: Payer: Self-pay | Admitting: Internal Medicine

## 2021-08-12 DIAGNOSIS — G8929 Other chronic pain: Secondary | ICD-10-CM

## 2021-08-12 DIAGNOSIS — M25552 Pain in left hip: Secondary | ICD-10-CM

## 2021-08-12 DIAGNOSIS — M47816 Spondylosis without myelopathy or radiculopathy, lumbar region: Secondary | ICD-10-CM

## 2021-08-12 MED ORDER — TRAMADOL HCL 50 MG PO TABS: 50.0000 mg | ORAL_TABLET | Freq: Two times a day (BID) | ORAL | 0 refills | Status: AC | PRN

## 2021-08-12 NOTE — Telephone Encounter (Signed)
Informed pt that last refill on tramadol has been sent in to pharmacy and no further refills will be provided by Dr.Tracy. Pt will need to schedule with pcp to discuss more in depth she has an upcoming appt on 09/23/21 with Dr.Scott and will discuss with her then. Pt verbalized understanding to all.  ?

## 2021-08-12 NOTE — Telephone Encounter (Signed)
Sent refill  ?Further pain control sch appt to disc with PCP or disc at next PCP  ?Will not refill further ? ?

## 2021-08-13 ENCOUNTER — Telehealth: Payer: Self-pay | Admitting: Internal Medicine

## 2021-08-13 NOTE — Telephone Encounter (Signed)
I spoke with Santiago Glad at [facility] pt refused care.please advise and Thank you! ?

## 2021-08-14 ENCOUNTER — Encounter: Payer: Medicare HMO | Admitting: Physical Therapy

## 2021-08-16 ENCOUNTER — Telehealth: Payer: Self-pay | Admitting: Internal Medicine

## 2021-08-16 NOTE — Telephone Encounter (Signed)
Pt called in stating that she is taking medication (traMADol (ULTRAM) 50 MG tablet)... Pt stated that the medication states that she can take medication up to 10 days... Pt stated that Dr. Olivia Mackie refill the medication... Pt was wondering do she need to slowly take her self off the medication or take as need... Pt requesting callback.Marland Kitchen  ?

## 2021-08-19 ENCOUNTER — Encounter: Payer: Medicare HMO | Admitting: Physical Therapy

## 2021-08-21 ENCOUNTER — Encounter: Payer: Medicare HMO | Admitting: Physical Therapy

## 2021-08-21 ENCOUNTER — Telehealth: Payer: Self-pay | Admitting: Internal Medicine

## 2021-08-21 ENCOUNTER — Telehealth: Payer: Self-pay

## 2021-08-21 NOTE — Telephone Encounter (Addendum)
Lvm to inform pt in regards to note.  ?

## 2021-08-21 NOTE — Telephone Encounter (Signed)
Pt can take miralax 2-3 x per day in 8 ounces of liquid with colace 100 mg 1-2 x per day  ?With  ?Warm prune juice  ? ?

## 2021-08-21 NOTE — Telephone Encounter (Signed)
Pt stated she saw Dr. Olivia Mackie for constipation and she was instructed to get miralax and pt stated its not working. ?

## 2021-08-22 ENCOUNTER — Ambulatory Visit
Admission: RE | Admit: 2021-08-22 | Discharge: 2021-08-22 | Disposition: A | Discharge status: Home / Self Care | Payer: Medicare HMO | Source: Ambulatory Visit | Attending: Internal Medicine | Admitting: Internal Medicine

## 2021-08-22 DIAGNOSIS — R1084 Generalized abdominal pain: Secondary | ICD-10-CM | POA: Insufficient documentation

## 2021-08-22 DIAGNOSIS — X58XXXA Exposure to other specified factors, initial encounter: Secondary | ICD-10-CM | POA: Diagnosis not present

## 2021-08-22 DIAGNOSIS — M4726 Other spondylosis with radiculopathy, lumbar region: Secondary | ICD-10-CM | POA: Diagnosis not present

## 2021-08-22 DIAGNOSIS — M5416 Radiculopathy, lumbar region: Secondary | ICD-10-CM | POA: Diagnosis not present

## 2021-08-22 DIAGNOSIS — M4856XA Collapsed vertebra, not elsewhere classified, lumbar region, initial encounter for fracture: Secondary | ICD-10-CM | POA: Insufficient documentation

## 2021-08-22 DIAGNOSIS — W19XXXA Unspecified fall, initial encounter: Secondary | ICD-10-CM | POA: Insufficient documentation

## 2021-08-22 DIAGNOSIS — I700 Atherosclerosis of aorta: Secondary | ICD-10-CM | POA: Diagnosis not present

## 2021-08-22 DIAGNOSIS — K449 Diaphragmatic hernia without obstruction or gangrene: Secondary | ICD-10-CM | POA: Diagnosis not present

## 2021-08-22 DIAGNOSIS — M5116 Intervertebral disc disorders with radiculopathy, lumbar region: Secondary | ICD-10-CM | POA: Diagnosis not present

## 2021-08-22 DIAGNOSIS — M4316 Spondylolisthesis, lumbar region: Secondary | ICD-10-CM | POA: Diagnosis not present

## 2021-08-22 DIAGNOSIS — K5900 Constipation, unspecified: Secondary | ICD-10-CM | POA: Diagnosis not present

## 2021-08-22 DIAGNOSIS — M47816 Spondylosis without myelopathy or radiculopathy, lumbar region: Secondary | ICD-10-CM

## 2021-08-22 DIAGNOSIS — K5730 Diverticulosis of large intestine without perforation or abscess without bleeding: Secondary | ICD-10-CM | POA: Diagnosis not present

## 2021-08-22 DIAGNOSIS — K921 Melena: Secondary | ICD-10-CM | POA: Insufficient documentation

## 2021-08-22 DIAGNOSIS — N83201 Unspecified ovarian cyst, right side: Secondary | ICD-10-CM | POA: Diagnosis not present

## 2021-08-22 NOTE — Telephone Encounter (Signed)
Informed pt that she can take miralax 2-3 x per day in 8 ounces of liquid with colace 100 mg 1-2 x per day  ?With  ?Warm prune juice  ? Pt verbalized understanding to all.  ?

## 2021-08-26 ENCOUNTER — Telehealth: Payer: Self-pay

## 2021-08-26 ENCOUNTER — Encounter: Payer: Medicare HMO | Admitting: Physical Therapy

## 2021-08-26 ENCOUNTER — Other Ambulatory Visit: Payer: Self-pay

## 2021-08-26 DIAGNOSIS — S32010A Wedge compression fracture of first lumbar vertebra, initial encounter for closed fracture: Secondary | ICD-10-CM

## 2021-08-26 NOTE — Telephone Encounter (Signed)
Patient called to say she is out of tramadol.  Please refill, patient said she is completely out. ? ?*Patient's preferred pharmacy is [facility] in [place]. ?

## 2021-08-27 NOTE — Telephone Encounter (Signed)
Reviewed records.  Recently saw Dr Olivia Mackie.  MRI revealed compression fracture.  Given persistent pain and given new compression fracture, can schedule an appt with ortho for further evaluation and treatment.  If declines, can schedule an appt to see me to discuss further treatment.  ?

## 2021-08-27 NOTE — Telephone Encounter (Signed)
Left message to call office

## 2021-08-27 NOTE — Telephone Encounter (Signed)
Patient returned Kathy's phone call about MRI. ?

## 2021-08-27 NOTE — Telephone Encounter (Signed)
Patient agrees to see orthopedics but would like referral with PCP recommendations of who to see. Patient says the pain her lower back she says the pain is about an 8 it does not go completely away with the tramadol twice a day and was using lidocaine patch which did relieve the pain some now. She is asking for refill of both until she can see orthopedics she will buy the lidocaine patches OTC.  ?

## 2021-08-28 ENCOUNTER — Encounter: Payer: Medicare HMO | Admitting: Physical Therapy

## 2021-08-28 DIAGNOSIS — M545 Low back pain, unspecified: Secondary | ICD-10-CM | POA: Diagnosis not present

## 2021-08-28 NOTE — Telephone Encounter (Signed)
If she is having 8/10 pain in her back and with compression fracture, would recommend her go on over to [facility] for further evaluation and treatment.   ?

## 2021-08-28 NOTE — Telephone Encounter (Signed)
Patient voiced understanding to advice and will have someone take her to [facility] today.  ?

## 2021-09-02 ENCOUNTER — Encounter: Payer: Medicare HMO | Admitting: Physical Therapy

## 2021-09-04 ENCOUNTER — Encounter: Payer: Medicare HMO | Admitting: Physical Therapy

## 2021-09-05 ENCOUNTER — Ambulatory Visit (INDEPENDENT_AMBULATORY_CARE_PROVIDER_SITE_OTHER): Payer: Medicare HMO | Admitting: Internal Medicine

## 2021-09-05 DIAGNOSIS — E7800 Pure hypercholesterolemia, unspecified: Secondary | ICD-10-CM

## 2021-09-05 DIAGNOSIS — S32010D Wedge compression fracture of first lumbar vertebra, subsequent encounter for fracture with routine healing: Secondary | ICD-10-CM

## 2021-09-05 DIAGNOSIS — E1142 Type 2 diabetes mellitus with diabetic polyneuropathy: Secondary | ICD-10-CM

## 2021-09-05 DIAGNOSIS — M8580 Other specified disorders of bone density and structure, unspecified site: Secondary | ICD-10-CM | POA: Diagnosis not present

## 2021-09-05 DIAGNOSIS — E039 Hypothyroidism, unspecified: Secondary | ICD-10-CM | POA: Diagnosis not present

## 2021-09-05 DIAGNOSIS — I10 Essential (primary) hypertension: Secondary | ICD-10-CM | POA: Diagnosis not present

## 2021-09-05 DIAGNOSIS — Z853 Personal history of malignant neoplasm of breast: Secondary | ICD-10-CM

## 2021-09-05 DIAGNOSIS — M545 Low back pain, unspecified: Secondary | ICD-10-CM

## 2021-09-05 DIAGNOSIS — M549 Dorsalgia, unspecified: Secondary | ICD-10-CM | POA: Insufficient documentation

## 2021-09-05 DIAGNOSIS — K2100 Gastro-esophageal reflux disease with esophagitis, without bleeding: Secondary | ICD-10-CM | POA: Diagnosis not present

## 2021-09-05 DIAGNOSIS — K5900 Constipation, unspecified: Secondary | ICD-10-CM | POA: Diagnosis not present

## 2021-09-05 DIAGNOSIS — R935 Abnormal findings on diagnostic imaging of other abdominal regions, including retroperitoneum: Secondary | ICD-10-CM

## 2021-09-05 DIAGNOSIS — N83201 Unspecified ovarian cyst, right side: Secondary | ICD-10-CM

## 2021-09-05 MED ORDER — TRAMADOL HCL 50 MG PO TABS: 50.0000 mg | ORAL_TABLET | Freq: Two times a day (BID) | ORAL | 0 refills | Status: AC | PRN

## 2021-09-05 NOTE — Progress Notes (Signed)
Patient ID: Julie Hoover, female   DOB: [birth year], 88 y.o.   MRN: [id number]   Subjective:    Patient ID: Julie Hoover, female    DOB: 1929-12-17, 88 y.o.   MRN: [id number]   Patient here for a scheduled follow up.   Chief Complaint  Patient presents with   Follow-up    Follow up for back pain   .   HPI Had a fall after Easter.  Fell on left hip.  Increased pain - low back since.  Saw Dr [facility].  MRI ordered - revealed - Acute L1 compression fracture with approximately 50% loss of height. Minimal endplate retropulsion.Multilevel degenerative changes as detailed above. There is marked canal stenosis at L4-L5 with effacement of subarticular recesses. Was evaluated by [facility].  Was placed on tramadol.  Has been taking tramadol and tylenol.  Helps with pain.  Discussed scan and need for f/u.  Weight is down some.  Reports she is eating.  No vomiting.  No abdominal pain.  Taking miralax and colace. Trying to work out a regimen to keep her bowels regular.  No chest pain.  Breathing stable.    Past Medical History:  Diagnosis Date   Arthritis    Breast cancer ([place]) 1986   s/p right lumpectomy, XRT and Tamoxifen, mastectomy   Cancer ([place])    skin ca   Chicken pox    Colon polyps    Diabetes ([place])    Diverticulosis    Environmental allergies    GERD (gastroesophageal reflux disease)    Glaucoma    Hypercholesterolemia    Hyperglycemia    Hypertension    Hypothyroidism    Osteopenia    Peripheral neuropathy    Personal history of radiation therapy 1986   Urethral stricture    Past Surgical History:  Procedure Laterality Date   BREAST BIOPSY Right 6/86   lumpectomy-positive/rad   BREAST LUMPECTOMY  1986   right   ESOPHAGOGASTRODUODENOSCOPY (EGD) WITH PROPOFOL N/A 12/17/2015   Procedure: ESOPHAGOGASTRODUODENOSCOPY (EGD) WITH PROPOFOL;  Surgeon: Lollie Sails, MD;  Location: [facility] ENDOSCOPY;  Service: Endoscopy;  Laterality: N/A;   EYE SURGERY     Bilateral  cataracts removed   KNEE SURGERY  06/2008   torn meniscus, right   KNEE SURGERY  2012   torn menisucs, right   MASTECTOMY Right 1993   right, recurrence   TUBAL LIGATION     URETHRAL DILATION     Family History  Problem Relation Age of Onset   Heart disease Father    Hypertension Father    COPD Mother    Hypertension Brother        x2   Hypercholesterolemia Brother        x2   Hypertension Sister    Hypercholesterolemia Sister    Diabetes Mellitus II Sister    Cancer Other        mouth, aunt   Colon cancer Neg Hx    Breast cancer Neg Hx    Social History   Socioeconomic History   Marital status: Widowed    Spouse name: Not on file   Number of children: 2   Years of education: Not on file   Highest education level: Not on file  Occupational History   Not on file  Tobacco Use   Smoking status: Never   Smokeless tobacco: Never  Substance and Sexual Activity   Alcohol use: No    Alcohol/week: 0.0 standard drinks   Drug use:  No   Sexual activity: Never  Other Topics Concern   Not on file  Social History Narrative   Not on file   Social Determinants of Health   Financial Resource Strain: Low Risk    Difficulty of Paying Living Expenses: Not hard at all  Food Insecurity: No Food Insecurity   Worried About Charity fundraiser in the Last Year: Never true   [place] in the Last Year: Never true  Transportation Needs: No Transportation Needs   Lack of Transportation (Medical): No   Lack of Transportation (Non-Medical): No  Physical Activity: Not on file  Stress: No Stress Concern Present   Feeling of Stress : Not at all  Social Connections: Unknown   Frequency of Communication with Friends and Family: More than three times a week   Frequency of Social Gatherings with Friends and Family: More than three times a week   Attends Religious Services: Not on file   Active Member of Clubs or Organizations: Not on file   Attends Archivist Meetings:  Not on file   Marital Status: Widowed     Review of Systems  Constitutional:  Negative for fever.       Has lost some weight.   HENT:  Negative for congestion and sinus pressure.   Respiratory:  Negative for cough, chest tightness and shortness of breath.   Cardiovascular:  Negative for chest pain, palpitations and leg swelling.  Gastrointestinal:  Positive for constipation. Negative for abdominal pain, diarrhea, nausea and vomiting.       Some loose stool if takes colace and miralax daily.   Genitourinary:  Negative for difficulty urinating and dysuria.  Musculoskeletal:  Positive for back pain. Negative for joint swelling and myalgias.  Skin:  Negative for color change and rash.  Neurological:  Negative for dizziness, light-headedness and headaches.  Psychiatric/Behavioral:  Negative for agitation and dysphoric mood.       Objective:     BP 120/62 (BP Location: Left Arm, Patient Position: Sitting, Cuff Size: Small)   Pulse 63   Temp 98 F (36.7 C)   Resp 14   Ht $R'5\' 4"'Gj$  (1.626 m)   Wt 126 lb 6.4 oz (57.3 kg)   SpO2 93%   BMI 21.70 kg/m  Wt Readings from Last 3 Encounters:  09/05/21 126 lb 6.4 oz (57.3 kg)  08/07/21 131 lb 6.4 oz (59.6 kg)  07/16/21 131 lb 9.6 oz (59.7 kg)    Physical Exam Vitals reviewed.  Constitutional:      General: She is not in acute distress.    Appearance: Normal appearance.  HENT:     Head: Normocephalic and atraumatic.     Right Ear: External ear normal.     Left Ear: External ear normal.  Eyes:     General: No scleral icterus.       Right eye: No discharge.        Left eye: No discharge.     Conjunctiva/sclera: Conjunctivae normal.  Neck:     Thyroid: No thyromegaly.  Cardiovascular:     Rate and Rhythm: Normal rate and regular rhythm.  Pulmonary:     Effort: No respiratory distress.     Breath sounds: Normal breath sounds. No wheezing.  Abdominal:     General: Bowel sounds are normal.     Palpations: Abdomen is soft.      Tenderness: There is no abdominal tenderness.  Musculoskeletal:        General:  No swelling.     Cervical back: Neck supple. No tenderness.     Comments: Increased pain - across low back  Lymphadenopathy:     Cervical: No cervical adenopathy.  Skin:    Findings: No erythema or rash.  Neurological:     Mental Status: She is alert.  Psychiatric:        Mood and Affect: Mood normal.        Behavior: Behavior normal.     Outpatient Encounter Medications as of 09/05/2021  Medication Sig   ACCU-CHEK AVIVA PLUS test strip USE AS INSTRUCTED TO CHECK BLOOD SUGAR ONCE DAILY   Accu-Chek Softclix Lancets lancets USE AS INSTRUCTED TO CHECK BLOOD SUGAR ONCE DAILY   amLODipine (NORVASC) 5 MG tablet TAKE 1 TABLET EVERY DAY   aspirin 81 MG tablet Take 81 mg by mouth daily.   Blood Glucose Monitoring Suppl (ACCU-CHEK AVIVA PLUS) w/Device KIT 1 Device by Does not apply route daily as needed.   Calcium Carbonate-Vitamin D 600-400 MG-UNIT chew tablet Chew 1 tablet by mouth 2 (two) times daily.   carbamide peroxide (DEBROX) 6.5 % OTIC solution 4-5 drops in both ears daily.  Massage for approximately 5 minutes daily.   cetirizine (ZYRTEC) 10 MG tablet Take 10 mg by mouth daily.   Chromium-Cinnamon (CINNAMON PLUS CHROMIUM PO) Take by mouth.   dorzolamide-timolol (COSOPT) 22.3-6.8 MG/ML ophthalmic solution Place 1 drop into the left eye 2 (two) times daily.    gabapentin (NEURONTIN) 100 MG capsule TAKE 1 CAPSULE EVERY MORNING AND TAKE 2 CAPSULES AT BEDTIME.   latanoprost (XALATAN) 0.005 % ophthalmic solution Place 1 drop into the left eye at bedtime.   levothyroxine (SYNTHROID) 50 MCG tablet TAKE 1 TABLET EVERY DAY   Multiple Vitamins-Minerals (PRESERVISION AREDS 2) CHEW Chew 2 capsules by mouth.   omeprazole (PRILOSEC) 20 MG capsule TAKE 1 CAPSULE TWICE DAILY   rosuvastatin (CRESTOR) 20 MG tablet TAKE 1 TABLET EVERY DAY   temazepam (RESTORIL) 30 MG capsule Take 1 capsule (30 mg total) by mouth at bedtime  as needed.   traMADol (ULTRAM) 50 MG tablet Take 1 tablet (50 mg total) by mouth 2 (two) times daily as needed for up to 15 days.   [DISCONTINUED] Blood Glucose Monitoring Suppl (ACCU-CHEK AVIVA) device Use as instructed   No facility-administered encounter medications on file as of 09/05/2021.     Lab Results  Component Value Date   WBC 7.3 01/31/2021   HGB 13.3 01/31/2021   HCT 40.8 01/31/2021   PLT 228.0 01/31/2021   GLUCOSE 112 (H) 07/09/2021   CHOL 186 07/09/2021   TRIG 72.0 07/09/2021   HDL 78.50 07/09/2021   LDLCALC 93 07/09/2021   ALT 14 07/09/2021   AST 18 07/09/2021   NA 138 07/09/2021   K 4.3 07/09/2021   CL 101 07/09/2021   CREATININE 0.82 07/09/2021   BUN 14 07/09/2021   CO2 28 07/09/2021   TSH 1.61 01/31/2021   HGBA1C 6.6 (H) 07/09/2021   MICROALBUR <0.7 01/31/2021    CT ABDOMEN PELVIS WO CONTRAST  Result Date: 08/22/2021 CLINICAL DATA:  Abdominal pain, acute (Ped 0-17y) genearlized ab pain black stools r/o mass EXAM: CT ABDOMEN AND PELVIS WITHOUT CONTRAST TECHNIQUE: Multidetector CT imaging of the abdomen and pelvis was performed following the standard protocol without IV contrast. RADIATION DOSE REDUCTION: This exam was performed according to the departmental dose-optimization program which includes automated exposure control, adjustment of the mA and/or kV according to patient size and/or use of iterative  reconstruction technique. COMPARISON:  08/06/2009 FINDINGS: Lower chest: Scarring noted in the lung bases. There are tree-in-bud nodular densities in the right middle lobe, lingula and both lower lobes, likely related to scarring or indolent infection. Moderate-sized hiatal hernia. No effusions. Hepatobiliary: No focal hepatic abnormality. Gallbladder unremarkable. Pancreas: No focal abnormality or ductal dilatation. Spleen: No focal abnormality.  Normal size. Adrenals/Urinary Tract: No adrenal abnormality. No focal renal abnormality. No stones or hydronephrosis.  Urinary bladder is unremarkable. Stomach/Bowel: Extensive left colonic diverticulosis. No active diverticulitis. Moderate stool burden throughout the colon. Normal appendix. Stomach and small bowel decompressed, grossly unremarkable. Vascular/Lymphatic: Aortic atherosclerosis. No evidence of aneurysm or adenopathy. Reproductive: Right ovarian cyst measures 2.2 cm compared to 1.5 cm previously. This very slow growth likely reflects a benign process. Uterus and left adnexa unremarkable. Other: No free fluid or free air. Musculoskeletal: Mild compression fracture through the superior endplate of L1. This is new since prior lumbar spine imaging from 07/16/2021. IMPRESSION: Moderate-sized hiatal hernia. Scarring and likely sequelae of chronic/indolent infection in the lung bases such as MAI. Left colonic diverticulosis.  No active diverticulitis. 2.2 cm right ovarian cyst. Very slow growth over the last 12 years suggests a benign process. Aortic atherosclerosis. Mild compression fracture at L1, new since 07/16/2021. Electronically Signed   By: Rolm Baptise M.D.   On: 08/22/2021 10:48   MR Lumbar Spine Wo Contrast  Result Date: 08/22/2021 CLINICAL DATA:  Lumbar radiculopathy. Low back pain EXAM: MRI LUMBAR SPINE WITHOUT CONTRAST TECHNIQUE: Multiplanar, multisequence MR imaging of the lumbar spine was performed. No intravenous contrast was administered. COMPARISON:  None FINDINGS: Segmentation:  Standard. Alignment: Mild retrolisthesis at L2-L3 and L3-L4. Minimal superior endplate retropulsion at L1. Vertebrae: L1 compression fracture causing approximately 50% loss of height at the superior endplate. There is underlying marrow edema. Minor degenerative endplate marrow edema at L2-L3. No suspicious osseous lesion. Conus medullaris and cauda equina: Conus extends to the L1 level. Conus and cauda equina appear normal. Paraspinal and other soft tissues: Left ovarian cyst. Refer to same day CT abdomen and pelvis. Disc  levels: L1-L2:  Minimal disc bulge.  No canal or foraminal stenosis. L2-L3: Retrolisthesis and disc bulge. No canal or foraminal stenosis. L3-L4: Retrolisthesis and disc bulge eccentric to the left. Facet arthropathy with ligamentum flavum infolding. Minor canal stenosis. Narrowing of the left greater than right subarticular recesses. No right foraminal stenosis. Mild left foraminal stenosis. L4-L5: Disc bulge. Facet arthropathy with ligamentum flavum infolding. Marked canal stenosis with effacement of subarticular recesses. Mild foraminal stenosis. L5-S1: Small left foraminal protrusion. Facet arthropathy. No canal or foraminal stenosis. IMPRESSION: Acute L1 compression fracture with approximately 50% loss of height. Minimal endplate retropulsion. Multilevel degenerative changes as detailed above. There is marked canal stenosis at L4-L5 with effacement of subarticular recesses. Electronically Signed   By: Macy Mis M.D.   On: 08/22/2021 13:16       Assessment & Plan:   Problem List Items Addressed This Visit     Abnormal CT of the abdomen    Scarring and likely sequelae of chronic/indolent infection in the lung bases noted.  D/w her regarding f/u.  Consider pulmonary evaluation.        Back pain   Relevant Medications   traMADol (ULTRAM) 50 MG tablet   Other Relevant Orders   Ambulatory referral to Orthopedic Surgery   Compression fracture of L1 vertebra ([facility])    Recent MRI revealed compression fracture of L1 as outlined.  With increased pain.  Saw  [facility].  Placed on tramadol.  Request f/u with ortho in [place].  Order placed for referral.  Refilled tramadol until can get her in with ortho.         Constipation    Discussed her current bowel regimen.  Taking colace daily.  If takes colace and miralax daily, she has loose stool.  See if just taking miralax on a regular basis keeps her regular without loose stool.  Can change to qod if needed. Follow.        GERD (gastroesophageal  reflux disease)    No acid reflux reported.  On protonix.        History of breast cancer    Mammogram 01/21/21 - [facility].        Hypercholesterolemia    Continue crestor.  Low cholesterol diet and exercise.  Follow lipid panel and liver function tests.         Hypertension    Continue amlodipine. Follow pressures.  Follow metabolic panel.        Hypothyroidism    On thyroid replacement.  Follow tsh.         Osteopenia    Needs f/u bone density.         Ovarian cyst    Noted on recent CT scan.  (2.2cm).  Compared to a previous scan and felt to be a benign process given slow growth.  Will discuss with her regarding f/u.  Has seen gyn previously for f/u ovarian cyst.        Type 2 diabetes mellitus with diabetic polyneuropathy, without long-term current use of insulin ([place])     Follow met b and a1c. Lab Results  Component Value Date   HGBA1C 6.6 (H) 07/09/2021           Einar Pheasant, MD

## 2021-09-06 NOTE — Telephone Encounter (Signed)
Following up on msg. Do you want me to cancel home health referral? Please advise and Thank you!

## 2021-09-09 ENCOUNTER — Encounter: Payer: Self-pay | Admitting: Internal Medicine

## 2021-09-09 DIAGNOSIS — N83209 Unspecified ovarian cyst, unspecified side: Secondary | ICD-10-CM | POA: Insufficient documentation

## 2021-09-09 DIAGNOSIS — R935 Abnormal findings on diagnostic imaging of other abdominal regions, including retroperitoneum: Secondary | ICD-10-CM | POA: Insufficient documentation

## 2021-09-09 NOTE — Telephone Encounter (Signed)
S/w pt - does not feel that she needs help with strength, balance, etc. Pt states she has a walker available if she needs it, does not think she needs help.

## 2021-09-09 NOTE — Telephone Encounter (Signed)
Pt declines.  Thank you.

## 2021-09-09 NOTE — Assessment & Plan Note (Signed)
Follow met b and a1c. Lab Results  Component Value Date   HGBA1C 6.6 (H) 07/09/2021

## 2021-09-09 NOTE — Assessment & Plan Note (Signed)
Continue crestor.  Low cholesterol diet and exercise. Follow lipid panel and liver function tests.   

## 2021-09-09 NOTE — Assessment & Plan Note (Signed)
Discussed her current bowel regimen.  Taking colace daily.  If takes colace and miralax daily, she has loose stool.  See if just taking miralax on a regular basis keeps her regular without loose stool.  Can change to qod if needed. Follow.

## 2021-09-09 NOTE — Assessment & Plan Note (Signed)
Needs f/u bone density.

## 2021-09-09 NOTE — Assessment & Plan Note (Signed)
Noted on recent CT scan.  (2.2cm).  Compared to a previous scan and felt to be a benign process given slow growth.  Will discuss with her regarding f/u.  Has seen gyn previously for f/u ovarian cyst.  

## 2021-09-09 NOTE — Assessment & Plan Note (Signed)
On thyroid replacement.  Follow tsh.  

## 2021-09-09 NOTE — Assessment & Plan Note (Signed)
Mammogram 01/21/21 - Birads I.  

## 2021-09-09 NOTE — Assessment & Plan Note (Signed)
Scarring and likely sequelae of chronic/indolent infection in the lung bases noted.  D/w her regarding f/u.  Consider pulmonary evaluation.

## 2021-09-09 NOTE — Telephone Encounter (Signed)
Please call and see if pt agreeable to home health - to help with strengthening, gait, etc. Let me know.

## 2021-09-09 NOTE — Assessment & Plan Note (Signed)
Continue amlodipine.  Follow pressures.  Follow metabolic panel.   

## 2021-09-09 NOTE — Assessment & Plan Note (Signed)
No acid reflux reported.  On protonix.  

## 2021-09-09 NOTE — Assessment & Plan Note (Signed)
Recent MRI revealed compression fracture of L1 as outlined.  With increased pain.  Saw [facility].  Placed on tramadol.  Request f/u with ortho in [place].  Order placed for referral.  Refilled tramadol until can get her in with ortho.

## 2021-09-12 ENCOUNTER — Telehealth: Payer: Self-pay | Admitting: Internal Medicine

## 2021-09-12 ENCOUNTER — Telehealth: Payer: Self-pay

## 2021-09-12 ENCOUNTER — Other Ambulatory Visit: Payer: Self-pay

## 2021-09-12 NOTE — Telephone Encounter (Signed)
Please confirm how much tramadol she is taking and how much she has left.

## 2021-09-12 NOTE — Telephone Encounter (Signed)
Pt sched 5/30 w/ [facility] for eval

## 2021-09-12 NOTE — Telephone Encounter (Signed)
Pt called stating she want a referral in [place] for her back pain if she can get in sooner

## 2021-09-13 NOTE — Telephone Encounter (Signed)
Has an appt with ortho on 09/17/21.  They can takeover prescribing/treating at that appt.

## 2021-09-13 NOTE — Telephone Encounter (Signed)
Pt is taking 2 per day :   one tramadol tablet with 2 extra strength tylenol in AM and one tramadol tablet with 2 extra strength tylenol in PM. Pt was unsure how many pills she has left but stated she thinks she will be taking her last one on 5/30.

## 2021-09-17 DIAGNOSIS — S32010A Wedge compression fracture of first lumbar vertebra, initial encounter for closed fracture: Secondary | ICD-10-CM | POA: Diagnosis not present

## 2021-09-18 DIAGNOSIS — H401133 Primary open-angle glaucoma, bilateral, severe stage: Secondary | ICD-10-CM | POA: Diagnosis not present

## 2021-09-19 ENCOUNTER — Other Ambulatory Visit: Payer: Self-pay | Admitting: Orthopedic Surgery

## 2021-09-19 DIAGNOSIS — S32010A Wedge compression fracture of first lumbar vertebra, initial encounter for closed fracture: Secondary | ICD-10-CM

## 2021-09-23 ENCOUNTER — Ambulatory Visit: Payer: Medicare HMO | Admitting: Internal Medicine

## 2021-09-24 ENCOUNTER — Other Ambulatory Visit
Admission: RE | Admit: 2021-09-24 | Discharge: 2021-09-24 | Disposition: A | Discharge status: Home / Self Care | Payer: Medicare HMO | Source: Ambulatory Visit | Attending: Interventional Radiology | Admitting: Interventional Radiology

## 2021-09-24 ENCOUNTER — Ambulatory Visit
Admission: RE | Admit: 2021-09-24 | Discharge: 2021-09-24 | Disposition: A | Discharge status: Home / Self Care | Payer: Medicare HMO | Source: Ambulatory Visit | Attending: Orthopedic Surgery | Admitting: Orthopedic Surgery

## 2021-09-24 DIAGNOSIS — S32010A Wedge compression fracture of first lumbar vertebra, initial encounter for closed fracture: Secondary | ICD-10-CM | POA: Diagnosis not present

## 2021-09-24 DIAGNOSIS — Z0100 Encounter for examination of eyes and vision without abnormal findings: Secondary | ICD-10-CM | POA: Diagnosis not present

## 2021-09-24 DIAGNOSIS — X58XXXA Exposure to other specified factors, initial encounter: Secondary | ICD-10-CM | POA: Insufficient documentation

## 2021-09-24 DIAGNOSIS — H353132 Nonexudative age-related macular degeneration, bilateral, intermediate dry stage: Secondary | ICD-10-CM | POA: Diagnosis not present

## 2021-09-24 LAB — COMPREHENSIVE METABOLIC PANEL
ALT: 17 U/L (ref 0–44)
AST: 18 U/L (ref 15–41)
Albumin: 3.8 g/dL (ref 3.5–5.0)
Alkaline Phosphatase: 60 U/L (ref 38–126)
Anion gap: 7 (ref 5–15)
BUN: 14 mg/dL (ref 8–23)
CO2: 25 mmol/L (ref 22–32)
Calcium: 9.5 mg/dL (ref 8.9–10.3)
Chloride: 104 mmol/L (ref 98–111)
Creatinine, Ser: 0.59 mg/dL (ref 0.44–1.00)
GFR, Estimated: >60 mL/min (ref >60)
Glucose, Bld: 100 mg/dL — ABNORMAL HIGH (ref 70–99)
Potassium: 4.1 mmol/L (ref 3.5–5.1)
Sodium: 136 mmol/L (ref 135–145)
Total Bilirubin: 0.7 mg/dL (ref 0.3–1.2)
Total Protein: 7.1 g/dL (ref 6.5–8.1)

## 2021-09-24 LAB — CBC WITH DIFFERENTIAL/PLATELET
Abs Immature Granulocytes: 0.03 10*3/uL (ref 0.00–0.07)
Basophils Absolute: 0 10*3/uL (ref 0.0–0.1)
Basophils Relative: 0 %
Eosinophils Absolute: 0.1 10*3/uL (ref 0.0–0.5)
Eosinophils Relative: 2 %
HCT: 39.6 % (ref 36.0–46.0)
Hemoglobin: 13.1 g/dL (ref 12.0–15.0)
Immature Granulocytes: 0 %
Lymphocytes Relative: 27 %
Lymphs Abs: 2.4 10*3/uL (ref 0.7–4.0)
MCH: 30.9 pg (ref 26.0–34.0)
MCHC: 33.1 g/dL (ref 30.0–36.0)
MCV: 93.4 fL (ref 80.0–100.0)
Monocytes Absolute: 0.9 10*3/uL (ref 0.1–1.0)
Monocytes Relative: 10 %
Neutro Abs: 5.5 10*3/uL (ref 1.7–7.7)
Neutrophils Relative %: 61 %
Platelets: 244 10*3/uL (ref 150–400)
RBC: 4.24 MIL/uL (ref 3.87–5.11)
RDW: 15.2 % (ref 11.5–15.5)
WBC: 9 10*3/uL (ref 4.0–10.5)
nRBC: 0 % (ref 0.0–0.2)

## 2021-09-24 LAB — HM DIABETES EYE EXAM

## 2021-09-24 NOTE — H&P (Signed)
Interventional Radiology - Clinic Visit, Initial H&P    Referring Provider: Reche Dixon, PA-C  Reason for Visit: Compression fracture     History of Present Illness  Julie Hoover is a 88 y.o. female with a relevant past medical history of osteopenia seen today in Interventional Radiology clinic for L1 compression fracture.  Patient had recent fall onto concrete driveway with CT scan on Aug 22, 2021 demonstrating new acute compression fracture of the L1 vertebral body.  MRI lumbar spine same day confirmed acute L1 compression fracture with approximately 50% height loss.  Patient reports ongoing moderate 5/10 lower back pain that is worse on the left side.  Pain has been attempted to be managed conservatively over the past month with over-the-counter extra-strength Tylenol and prescription tramadol.  This has not significantly improved her pain, and has resulted in significant constipation.    Patient scored significant disability on the [employer] disability questionnaire with 23/24 positive.   Past medical history significant for hypertension and remote history of breast cancer status post mastectomy in 1993.  Patient denies any cardiopulmonary disease.  Medication significant for daily aspirin 81 mg.  Constitutional review of systems negative.    Additional Past Medical History Past Medical History:  Diagnosis Date   Arthritis    Breast cancer ([place]) 1986   s/p right lumpectomy, XRT and Tamoxifen, mastectomy   Cancer ([place])    skin ca   Chicken pox    Colon polyps    Diabetes ([place])    Diverticulosis    Environmental allergies    GERD (gastroesophageal reflux disease)    Glaucoma    Hypercholesterolemia    Hyperglycemia    Hypertension    Hypothyroidism    Osteopenia    Peripheral neuropathy    Personal history of radiation therapy 1986   Urethral stricture      Surgical History  Past Surgical History:  Procedure Laterality Date   BREAST BIOPSY Right 6/86    lumpectomy-positive/rad   BREAST LUMPECTOMY  1986   right   ESOPHAGOGASTRODUODENOSCOPY (EGD) WITH PROPOFOL N/A 12/17/2015   Procedure: ESOPHAGOGASTRODUODENOSCOPY (EGD) WITH PROPOFOL;  Surgeon: Lollie Sails, MD;  Location: [facility] ENDOSCOPY;  Service: Endoscopy;  Laterality: N/A;   EYE SURGERY     Bilateral cataracts removed   KNEE SURGERY  06/2008   torn meniscus, right   KNEE SURGERY  2012   torn menisucs, right   MASTECTOMY Right 1993   right, recurrence   TUBAL LIGATION     URETHRAL DILATION       Medications  I have reviewed the current medication list. Refer to chart for details. Current Outpatient Medications  Medication Instructions   ACCU-CHEK AVIVA PLUS test strip USE AS INSTRUCTED TO CHECK BLOOD SUGAR ONCE DAILY   Accu-Chek Softclix Lancets lancets USE AS INSTRUCTED TO CHECK BLOOD SUGAR ONCE DAILY   amLODipine (NORVASC) 5 MG tablet TAKE 1 TABLET EVERY DAY   aspirin 81 mg, Daily   Blood Glucose Monitoring Suppl (ACCU-CHEK AVIVA PLUS) w/Device KIT 1 Device, Does not apply, Daily PRN   Calcium Carbonate-Vitamin D 600-400 MG-UNIT chew tablet 1 tablet, Oral, 2 times daily   carbamide peroxide (DEBROX) 6.5 % OTIC solution 4-5 drops in both ears daily.  Massage for approximately 5 minutes daily.   cetirizine (ZYRTEC) 10 mg, Daily   Chromium-Cinnamon (CINNAMON PLUS CHROMIUM PO) Oral   dorzolamide-timolol (COSOPT) 22.3-6.8 MG/ML ophthalmic solution 1 drop, Left Eye, 2 times daily   gabapentin (NEURONTIN) 100 MG capsule TAKE  1 CAPSULE EVERY MORNING AND TAKE 2 CAPSULES AT BEDTIME.   latanoprost (XALATAN) 0.005 % ophthalmic solution 1 drop, Left Eye, Daily at bedtime   levothyroxine (SYNTHROID) 50 MCG tablet TAKE 1 TABLET EVERY DAY   Multiple Vitamins-Minerals (PRESERVISION AREDS 2) CHEW 2 capsules, Oral   omeprazole (PRILOSEC) 20 MG capsule TAKE 1 CAPSULE TWICE DAILY   rosuvastatin (CRESTOR) 20 MG tablet TAKE 1 TABLET EVERY DAY   temazepam (RESTORIL) 30 mg, Oral, At bedtime PRN       Allergies Allergies  Allergen Reactions   Iodinated Contrast Media     Other reaction(s): Blood Disorder Uncoded Allergy. Allergen: ANESTHESIA ENDING IN THANE   Ciprofloxacin Other (See Comments)    Intolerance when taking large doses   [place]     Blisters    Halothane Other (See Comments)    Hepatitis and elevated liver enzymes   Hydrocodone Itching   Norco [Hydrocodone-Acetaminophen] Itching   Sulfa Antibiotics Rash   Does patient have contrast allergy: No  ; confirmed contrast administration on previous CT scan without issue    Physical Exam Current Vitals Temp: 97.7 F (36.5 C) ( )  Pulse Rate: 60  Resp: 16  BP: (!) 155/71  SpO2: 95 %        There is no height or weight on file to calculate BMI.  General: Alert and answers questions appropriately. No apparent distress. HEENT: Normocephalic, atraumatic. Conjunctivae normal without scleral icterus. Cardiac: Regular rate. No dependent edema. Pulmonary: Normal work of breathing. On room air. Back: Focal lower back tenderness overlying L1 vertebral body.    Pertinent Lab Results    Latest Ref Rng & Units 01/31/2021    8:50 AM 01/05/2020    8:02 AM 12/09/2018    8:21 AM  CBC  WBC 4.0 - 10.5 K/uL 7.3   7.2   6.7    Hemoglobin 12.0 - 15.0 g/dL 13.3   13.9   13.6    Hematocrit 36.0 - 46.0 % 40.8   41.8   41.3    Platelets 150.0 - 400.0 K/uL 228.0   215.0   225.0        Latest Ref Rng & Units 07/09/2021    8:58 AM 01/31/2021    8:50 AM 09/27/2020    8:14 AM  CMP  Glucose 70 - 99 mg/dL 112   108   96    BUN 6 - 23 mg/dL _0 Creatinine 0.40 - 1.20 mg/dL 0.82   0.79   0.75    Sodium 135 - 145 mEq/L 138   141   141    Potassium 3.5 - 5.1 mEq/L 4.3   4.1   4.0    Chloride 96 - 112 mEq/L 101   104   104    CO2 19 - 32 mEq/L _1 Calcium 8.4 - 10.5 mg/dL 10.0   9.9   9.6    Total Protein 6.0 - 8.3 g/dL 7.4   7.7   7.2    Total Bilirubin 0.2 - 1.2 mg/dL 0.7   0.6   0.7    Alkaline  Phos 39 - 117 U/L 65   63   57    AST 0 - 37 U/L _2 ALT 0 - 35 U/L 14   15   18  Relevant and/or Recent Imaging: Reviewed; per HPI  DXA Sept 202 - Osteopenia    Assessment & Plan Julie Hoover is a 88 y.o. female seen today in Interventional Radiology clinic for traumatic acute/subacute L1 compression fracture.   Patient has failed trial of conservative therapy over 1 month with no improvement from narcotic pain medication which she is also unable to tolerate and has stopped taking due to constipation. She also scored significant disability on the [employer] with 23/24 positive symptoms. Given these findings and her ongoing pain that significantly affects her abilities of daily living, she is an appropriate candidate for L1 kyphoplasty due to traumatic compression fracture in an osteopenic patient.   Risk stratification: ASA Classification: ASA 2 - Patient with mild systemic disease with no functional limitations   These were discussed with the patient, and her caregiver present in the room. We discussed the details of the procedure and its risks and benefits to the patient, who voiced his/her understanding. Patient is agreeable to proceed.    Plan:  Kyphoplasty for symptomatic traumatic L1 compression fracture    Pre-procedure planning:  Post-procedure disposition: outpatient [facility]   Sedation plan: fentanyl and midazolam Medication holds: Aspirin x3-5 days   Positioning/access site: Prone   Foley catheter needed: No Contrast premedication: No Labs needed on or before procedure day: CBC, CMP within 60 days   Other: None     The patient has suffered a fracture of the [L1 vertebral body]. It is recommended that patients aged 16 years or older be evaluated for possible testing or treatment of osteoporosis.  A copy of this [consult] report is sent to the patient's referring physician.    Total time spent on today's visit was  over [60] minutes, including both face-to-face time and non face-to-face time, personally spent on review of chart (including labs and relevant imaging), discussing further workup and treatment options, referral to specialist if needed, reviewing outside records if pertinent, answering patient questions, and coordinating care regarding [traumatic L1 compression fracture] as well as management strategy.     Albin Felling, MD  Vascular and Interventional Radiology 09/24/2021 11:27 AM

## 2021-09-25 NOTE — Discharge Instructions (Addendum)
Kyphoplasty Post Procedure Discharge Instructions  May resume a regular diet and any medications that you routinely take (including pain medications). However, if you are taking Aspirin or an anticoagulant/blood thinner you will be told when you can resume taking these by the healthcare provider. No driving day of procedure. The day of your procedure take it easy. You may use an ice pack as needed to injection sites on back.  Ice to back 30 minutes on and 30 minutes off, as needed. May remove bandaids tomorrow after taking a shower. Replace daily with a clean bandaid until healed.  Do not lift anything heavier than a milk jug for 1-2 weeks or determined by your physician.  Follow up with your physician in 2 weeks.    Please contact our office at [phone] for the following symptoms or if you have any questions:  Fever greater than 100 degrees Increased swelling, pain, or redness at injection site. Increased back and/or leg pain New numbness or change in symptoms from before the procedure.    Thank you for visiting [facility].   YOU MAY RESUME ASPIRIN AFTER PROCEDURE.

## 2021-09-26 ENCOUNTER — Ambulatory Visit
Admission: RE | Admit: 2021-09-26 | Discharge: 2021-09-26 | Disposition: A | Discharge status: Home / Self Care | Payer: Medicare HMO | Source: Ambulatory Visit | Attending: Orthopedic Surgery | Admitting: Orthopedic Surgery

## 2021-09-26 DIAGNOSIS — S32010A Wedge compression fracture of first lumbar vertebra, initial encounter for closed fracture: Secondary | ICD-10-CM | POA: Diagnosis not present

## 2021-09-26 HISTORY — PX: IR KYPHO LUMBAR INC FX REDUCE BONE BX UNI/BIL CANNULATION INC/IMAGING: IMG5519

## 2021-09-26 MED ORDER — IOPAMIDOL (ISOVUE-M 200) INJECTION 41%
1.0000 mL | Freq: Once | INTRAMUSCULAR | Status: AC
  Administered 2021-09-26: 1 mL

## 2021-09-26 MED ORDER — MIDAZOLAM HCL 2 MG/2ML IJ SOLN
1.0000 mg | INTRAMUSCULAR | Status: DC | PRN
  Administered 2021-09-26 (×3): 0.5 mg via INTRAVENOUS

## 2021-09-26 MED ORDER — ACETAMINOPHEN 10 MG/ML IV SOLN
1000.0000 mg | Freq: Once | INTRAVENOUS | Status: AC
  Administered 2021-09-26: 1000 mg via INTRAVENOUS

## 2021-09-26 MED ORDER — SODIUM CHLORIDE 0.9 % IV SOLN: INTRAVENOUS | Status: DC

## 2021-09-26 MED ORDER — CEFAZOLIN SODIUM-DEXTROSE 2-4 GM/100ML-% IV SOLN
2.0000 g | INTRAVENOUS | Status: AC
  Administered 2021-09-26: 2 g via INTRAVENOUS

## 2021-09-26 MED ORDER — FENTANYL CITRATE PF 50 MCG/ML IJ SOSY
25.0000 ug | PREFILLED_SYRINGE | INTRAMUSCULAR | Status: DC | PRN
  Administered 2021-09-26: 50 ug via INTRAVENOUS

## 2021-09-26 NOTE — Progress Notes (Signed)
Pt back in nursing recovery area. Pt still drowsy from procedure but will wake up when spoken to. Pt follows commands, talks in complete sentences and has no complaints at this time. Pt will remain in nursing station until discharge.  ?

## 2021-09-26 NOTE — H&P (Addendum)
H&P Update    Patient has H&P documented in the EMR within the past 30 days. See H&P dated 09/24/2021 by Albin Felling.    Patient reports no interval changes to their medical history or medications.    Physical Exam:  Temp: 98.1 F (36.7 C) (Temp Source: Oral)   Pulse Rate: (!) 58   Resp: 16   BP: (!) 149/72   SpO2: 97 %        There is no height or weight on file to calculate BMI.  Mallampati score: I (soft palate, uvula, fauces, and tonsillar pillars visible) CV: Regular rate.  Pulmonary: Normal work of breathing. On room air.      Plan:  L1 KP Consent obtained: Risks of the procedure as well as the alternatives and risks of each were explained to the patient and/or caregiver.  Consent for the procedure was obtained and is signed in the bedside chart Consent obtained from: The patient Patient is appropriate candidate for sedation Yes Sedation plan: fentanyl and midazolam ASA Classification: ASA 2 - Patient with mild systemic disease with no functional limitations NPO status: 0000  Code status:   Code Status: Not on file Pre-procedural prep necessary: none       Albin Felling, MD  Vascular and Interventional Radiology 09/26/2021 9:11 AM

## 2021-10-03 ENCOUNTER — Other Ambulatory Visit: Payer: Self-pay | Admitting: Interventional Radiology

## 2021-10-03 ENCOUNTER — Telehealth: Payer: Self-pay

## 2021-10-03 DIAGNOSIS — Z712 Person consulting for explanation of examination or test findings: Secondary | ICD-10-CM

## 2021-10-03 NOTE — Telephone Encounter (Signed)
Phone call to pt to follow up from her kyphoplasty on 09/26/21. Pt reports her pain is a 5 only when sitting down and getting up from a chair, after previously being an 8 before the procedure. Dr. Denna Haggard made aware. Pt reports she is able to move around a little better. Pt denies any signs of infection, redness at the site, draining or fever. Pt is planning to go on vacation for a week and will be scheduled for a telephone follow up with Dr. Ky Barban Abd next week. Pt advised to call back if anything were to change or any concerns arise and we will arrange an in person appointment. Pt verbalized understanding.

## 2021-10-07 ENCOUNTER — Other Ambulatory Visit: Payer: Self-pay

## 2021-10-07 DIAGNOSIS — E1142 Type 2 diabetes mellitus with diabetic polyneuropathy: Secondary | ICD-10-CM

## 2021-10-07 MED ORDER — ACCU-CHEK AVIVA PLUS VI STRP: ORAL_STRIP | 1 refills | Status: DC

## 2021-10-07 MED ORDER — ACCU-CHEK SOFTCLIX LANCETS MISC: 3 refills | Status: DC

## 2021-10-15 ENCOUNTER — Other Ambulatory Visit: Payer: Self-pay

## 2021-10-15 DIAGNOSIS — E1142 Type 2 diabetes mellitus with diabetic polyneuropathy: Secondary | ICD-10-CM

## 2021-10-15 MED ORDER — ACCU-CHEK AVIVA PLUS VI STRP: ORAL_STRIP | 1 refills | Status: DC

## 2021-10-15 MED ORDER — ACCU-CHEK SOFTCLIX LANCETS MISC: 3 refills | Status: DC

## 2021-10-17 ENCOUNTER — Ambulatory Visit
Admission: RE | Admit: 2021-10-17 | Discharge: 2021-10-17 | Disposition: A | Discharge status: Home / Self Care | Payer: Medicare HMO | Source: Ambulatory Visit | Attending: Interventional Radiology | Admitting: Interventional Radiology

## 2021-10-17 DIAGNOSIS — M545 Low back pain, unspecified: Secondary | ICD-10-CM

## 2021-10-17 DIAGNOSIS — S32010D Wedge compression fracture of first lumbar vertebra, subsequent encounter for fracture with routine healing: Secondary | ICD-10-CM | POA: Diagnosis not present

## 2021-10-17 DIAGNOSIS — M8588 Other specified disorders of bone density and structure, other site: Secondary | ICD-10-CM | POA: Diagnosis not present

## 2021-10-17 DIAGNOSIS — Z712 Person consulting for explanation of examination or test findings: Secondary | ICD-10-CM

## 2021-10-17 DIAGNOSIS — Z9889 Other specified postprocedural states: Secondary | ICD-10-CM | POA: Diagnosis not present

## 2021-10-17 DIAGNOSIS — M5136 Other intervertebral disc degeneration, lumbar region: Secondary | ICD-10-CM | POA: Diagnosis not present

## 2021-10-17 HISTORY — PX: IR RADIOLOGIST EVAL & MGMT: IMG5224

## 2021-10-17 NOTE — Progress Notes (Signed)
Interventional Radiology - Clinic Visit, Follow-up Note    History of Present Illness  Julie Hoover is a 88 y.o. female with a history of L1 compression fracture s/p L1 kyphoplasty on September 26, 2021. She initially began to experience some relief in the first week, but now reports pain has returned to similar to before the procedure in the upper lower back. She takes Tylenol q6 hours to help the pain, with intermittent ibuprofen use as needed. She rates the pain 6/10, with some moderate relief with the OTC pain medication. Denies amy new trauma.    Past medical and surgical history reviewed. No interval changes. No interval hospitalizations.   Medications  I have reviewed the current medication list. Refer to chart for details. Current Outpatient Medications  Medication Instructions   Accu-Chek Softclix Lancets lancets Use as instructed dx - e11.9   amLODipine (NORVASC) 5 MG tablet TAKE 1 TABLET EVERY DAY   aspirin 81 mg, Daily   Blood Glucose Monitoring Suppl (ACCU-CHEK AVIVA PLUS) w/Device KIT 1 Device, Does not apply, Daily PRN   Calcium Carbonate-Vitamin D 600-400 MG-UNIT chew tablet 1 tablet, Oral, 2 times daily   carbamide peroxide (DEBROX) 6.5 % OTIC solution 4-5 drops in both ears daily.  Massage for approximately 5 minutes daily.   cetirizine (ZYRTEC) 10 mg, Daily   Chromium-Cinnamon (CINNAMON PLUS CHROMIUM PO) Oral   dorzolamide-timolol (COSOPT) 22.3-6.8 MG/ML ophthalmic solution 1 drop, Left Eye, 2 times daily   gabapentin (NEURONTIN) 100 MG capsule TAKE 1 CAPSULE EVERY MORNING AND TAKE 2 CAPSULES AT BEDTIME.   glucose blood (ACCU-CHEK AVIVA PLUS) test strip Use as instructed dx e11.9   latanoprost (XALATAN) 0.005 % ophthalmic solution 1 drop, Left Eye, Daily at bedtime   levothyroxine (SYNTHROID) 50 MCG tablet TAKE 1 TABLET EVERY DAY   Multiple Vitamins-Minerals (PRESERVISION AREDS 2) CHEW 2 capsules, Oral   omeprazole (PRILOSEC) 20 MG capsule TAKE 1 CAPSULE TWICE DAILY    rosuvastatin (CRESTOR) 20 MG tablet TAKE 1 TABLET EVERY DAY   temazepam (RESTORIL) 30 mg, Oral, At bedtime PRN      Physical Exam Current Vitals   ( )                    There is no height or weight on file to calculate BMI.  General: Alert and answers questions appropriately. No apparent distress. HEENT: Normocephalic, atraumatic. Conjunctivae normal without scleral icterus. Cardiac: Regular rate and rhythm. No dependent edema. Pulmonary: Normal work of breathing. On room air. Abdominal: Soft without distension.    Pertinent Lab Results    Latest Ref Rng & Units 09/24/2021    5:34 PM 01/31/2021    8:50 AM 01/05/2020    8:02 AM  CBC  WBC 4.0 - 10.5 K/uL 9.0  7.3  7.2   Hemoglobin 12.0 - 15.0 g/dL 13.1  13.3  13.9   Hematocrit 36.0 - 46.0 % 39.6  40.8  41.8   Platelets 150 - 400 K/uL 244  228.0  215.0       Latest Ref Rng & Units 09/24/2021    5:34 PM 07/09/2021    8:58 AM 01/31/2021    8:50 AM  CMP  Glucose 70 - 99 mg/dL 100  112  108   BUN 8 - 23 mg/dL 14  14  13   Creatinine 0.44 - 1.00 mg/dL 0.59  0.82  0.79   Sodium 135 - 145 mmol/L 136  138  141   Potassium 3.5 -   5.1 mmol/L 4.1  4.3  4.1   Chloride 98 - 111 mmol/L 104  101  104   CO2 22 - 32 mmol/L _0 Calcium 8.9 - 10.3 mg/dL 9.5  10.0  9.9   Total Protein 6.5 - 8.1 g/dL 7.1  7.4  7.7   Total Bilirubin 0.3 - 1.2 mg/dL 0.7  0.7  0.6   Alkaline Phos 38 - 126 U/L 60  65  63   AST 15 - 41 U/L _1 ALT 0 - 44 U/L _2 Relevant and/or Recent Imaging: L spine XR today - personally read, suspect possible T12 inferior endplate mild compression fracture. L1 KP appears as expected.    Assessment & Plan Julie Hoover is a 88 y.o. female with a history of L1 compression fracture s/p L1 kyphoplasty on September 26, 2021. She has unfortunately redeveloped pain similar to before the procedure. I repeated n L spine XR today, and shows expected changes at L1 s/p KP. However, there is some  new irregularity of the anterior inferior endplate of Z00 just above the cemented level, which may be a new minor compression fracture. She also has adjacent degenerative changes of the upper lumbar spine, which may contributing to her pain.    Plan:  1. I reached out to original referring provider Reche Dixon if he would re-evaluate her in clinic. Her pain may be residual from L1, due to new T12 injury, and/or underlying degenerative joint disease. She may need repeat MRI to evaluate T12, but will defer to orthopedic evaluation at this time. I called and left a message with [facility] office.     I spent a total of  40 Minutes in face-to-face in clinical consultation, greater than 50% of which was spent on medical decision-making and counseling/coordinating care for lower back pain.     Albin Felling, MD  Vascular and Interventional Radiology 10/17/2021 3:56 PM

## 2021-10-18 ENCOUNTER — Other Ambulatory Visit: Payer: Self-pay | Admitting: Orthopedic Surgery

## 2021-10-18 ENCOUNTER — Ambulatory Visit
Admission: RE | Admit: 2021-10-18 | Discharge: 2021-10-18 | Disposition: A | Discharge status: Home / Self Care | Payer: Medicare HMO | Source: Ambulatory Visit | Attending: Orthopedic Surgery | Admitting: Orthopedic Surgery

## 2021-10-18 DIAGNOSIS — S22080A Wedge compression fracture of T11-T12 vertebra, initial encounter for closed fracture: Secondary | ICD-10-CM | POA: Diagnosis not present

## 2021-10-18 DIAGNOSIS — M546 Pain in thoracic spine: Secondary | ICD-10-CM | POA: Diagnosis not present

## 2021-10-21 ENCOUNTER — Telehealth: Payer: Self-pay

## 2021-10-21 ENCOUNTER — Other Ambulatory Visit: Payer: Self-pay

## 2021-10-21 MED ORDER — TEMAZEPAM 30 MG PO CAPS: 30.0000 mg | ORAL_CAPSULE | Freq: Every evening | ORAL | 0 refills | Status: DC | PRN

## 2021-10-21 NOTE — Telephone Encounter (Signed)
Patient states she needs a refill of her temazepam (RESTORIL) 30 MG capsule.  Patient states she has three pills left and she takes them as needed.  *Patient states her preferred pharmacy is [facility].

## 2021-10-21 NOTE — Telephone Encounter (Signed)
sent 

## 2021-10-22 MED ORDER — TEMAZEPAM 30 MG PO CAPS: 30.0000 mg | ORAL_CAPSULE | Freq: Every evening | ORAL | 0 refills | Status: DC | PRN

## 2021-10-22 NOTE — Telephone Encounter (Signed)
PDMP reviewed.  Last refill 06/2021.  Rx ok'd for restoril #30 with no refills.

## 2021-10-23 ENCOUNTER — Other Ambulatory Visit: Payer: Self-pay | Admitting: Internal Medicine

## 2021-10-23 DIAGNOSIS — S22080A Wedge compression fracture of T11-T12 vertebra, initial encounter for closed fracture: Secondary | ICD-10-CM | POA: Diagnosis not present

## 2021-10-23 DIAGNOSIS — E1142 Type 2 diabetes mellitus with diabetic polyneuropathy: Secondary | ICD-10-CM

## 2021-11-13 ENCOUNTER — Other Ambulatory Visit (INDEPENDENT_AMBULATORY_CARE_PROVIDER_SITE_OTHER): Payer: Medicare HMO

## 2021-11-13 DIAGNOSIS — I10 Essential (primary) hypertension: Secondary | ICD-10-CM | POA: Diagnosis not present

## 2021-11-13 DIAGNOSIS — E7800 Pure hypercholesterolemia, unspecified: Secondary | ICD-10-CM | POA: Diagnosis not present

## 2021-11-13 DIAGNOSIS — E039 Hypothyroidism, unspecified: Secondary | ICD-10-CM

## 2021-11-13 DIAGNOSIS — E1142 Type 2 diabetes mellitus with diabetic polyneuropathy: Secondary | ICD-10-CM | POA: Diagnosis not present

## 2021-11-13 LAB — HEPATIC FUNCTION PANEL
ALT: 9 U/L (ref 0–35)
AST: 14 U/L (ref 0–37)
Albumin: 4.4 g/dL (ref 3.5–5.2)
Alkaline Phosphatase: 59 U/L (ref 39–117)
Bilirubin, Direct: 0.1 mg/dL (ref 0.0–0.3)
Total Bilirubin: 0.6 mg/dL (ref 0.2–1.2)
Total Protein: 7.3 g/dL (ref 6.0–8.3)

## 2021-11-13 LAB — HEMOGLOBIN A1C: Hgb A1c MFr Bld: 6.5 % (ref 4.6–6.5)

## 2021-11-13 LAB — LIPID PANEL
Cholesterol: 164 mg/dL (ref 0–200)
HDL: 68.4 mg/dL (ref >39.00)
LDL Cholesterol: 78 mg/dL (ref 0–99)
NonHDL: 95.75
Total CHOL/HDL Ratio: 2
Triglycerides: 89 mg/dL (ref 0.0–149.0)
VLDL: 17.8 mg/dL (ref 0.0–40.0)

## 2021-11-13 LAB — BASIC METABOLIC PANEL
BUN: 15 mg/dL (ref 6–23)
CO2: 30 mEq/L (ref 19–32)
Calcium: 9.8 mg/dL (ref 8.4–10.5)
Chloride: 102 mEq/L (ref 96–112)
Creatinine, Ser: 0.77 mg/dL (ref 0.40–1.20)
GFR: 68.06 mL/min (ref >60.00)
Glucose, Bld: 96 mg/dL (ref 70–99)
Potassium: 4.1 mEq/L (ref 3.5–5.1)
Sodium: 140 mEq/L (ref 135–145)

## 2021-11-13 LAB — CBC WITH DIFFERENTIAL/PLATELET
Basophils Absolute: 0 10*3/uL (ref 0.0–0.1)
Basophils Relative: 0.4 % (ref 0.0–3.0)
Eosinophils Absolute: 0 10*3/uL (ref 0.0–0.7)
Eosinophils Relative: 0.5 % (ref 0.0–5.0)
HCT: 38.8 % (ref 36.0–46.0)
Hemoglobin: 12.7 g/dL (ref 12.0–15.0)
Lymphocytes Relative: 21.1 % (ref 12.0–46.0)
Lymphs Abs: 2 10*3/uL (ref 0.7–4.0)
MCHC: 32.7 g/dL (ref 30.0–36.0)
MCV: 98.6 fl (ref 78.0–100.0)
Monocytes Absolute: 0.8 10*3/uL (ref 0.1–1.0)
Monocytes Relative: 8 % (ref 3.0–12.0)
Neutro Abs: 6.5 10*3/uL (ref 1.4–7.7)
Neutrophils Relative %: 70 % (ref 43.0–77.0)
Platelets: 251 10*3/uL (ref 150.0–400.0)
RBC: 3.94 Mil/uL (ref 3.87–5.11)
RDW: 16.2 % — ABNORMAL HIGH (ref 11.5–15.5)
WBC: 9.3 10*3/uL (ref 4.0–10.5)

## 2021-11-13 LAB — TSH: TSH: 1.94 u[IU]/mL (ref 0.35–5.50)

## 2021-11-18 ENCOUNTER — Other Ambulatory Visit: Payer: Self-pay

## 2021-11-18 ENCOUNTER — Ambulatory Visit: Payer: Medicare HMO | Admitting: Internal Medicine

## 2021-11-18 NOTE — Patient Outreach (Signed)
  Care Coordination   Initial Visit Note   11/18/2021 Name: Beatric Fulop MRN: [id number] DOB: 1929-12-19  Julie Hoover is a 88 y.o. year old female who sees Einar Pheasant, MD for primary care. I spoke with  Ayesha Mohair by phone today  What matters to the patients health and wellness today?  Patient denies any needs at this time.  She states she has a family friend and neighbor who takes her to her provider appointments. She reports she lives independent and is able to get her food and medications without problem.  Patient provided [facility] name and contact phone number and advised to call if services needed in the future.    Goals Addressed   None     SDOH assessments and interventions completed:   Yes   Care Coordination Interventions Activated:  No Care Coordination Interventions:  No, not indicated  Follow up plan:  No follow up needed  Encounter Outcome:  Pt. Refused  Quinn Plowman RN,BSN,CCM RN Care Manager Coordinator Friend  [phone]

## 2021-11-22 ENCOUNTER — Ambulatory Visit (INDEPENDENT_AMBULATORY_CARE_PROVIDER_SITE_OTHER): Payer: Medicare HMO | Admitting: Internal Medicine

## 2021-11-22 ENCOUNTER — Encounter: Payer: Self-pay | Admitting: Internal Medicine

## 2021-11-22 DIAGNOSIS — K2100 Gastro-esophageal reflux disease with esophagitis, without bleeding: Secondary | ICD-10-CM

## 2021-11-22 DIAGNOSIS — S32010D Wedge compression fracture of first lumbar vertebra, subsequent encounter for fracture with routine healing: Secondary | ICD-10-CM | POA: Diagnosis not present

## 2021-11-22 DIAGNOSIS — I10 Essential (primary) hypertension: Secondary | ICD-10-CM | POA: Diagnosis not present

## 2021-11-22 DIAGNOSIS — E1142 Type 2 diabetes mellitus with diabetic polyneuropathy: Secondary | ICD-10-CM

## 2021-11-22 DIAGNOSIS — E538 Deficiency of other specified B group vitamins: Secondary | ICD-10-CM | POA: Diagnosis not present

## 2021-11-22 DIAGNOSIS — G6289 Other specified polyneuropathies: Secondary | ICD-10-CM

## 2021-11-22 DIAGNOSIS — E7800 Pure hypercholesterolemia, unspecified: Secondary | ICD-10-CM

## 2021-11-22 DIAGNOSIS — Z853 Personal history of malignant neoplasm of breast: Secondary | ICD-10-CM

## 2021-11-22 DIAGNOSIS — R935 Abnormal findings on diagnostic imaging of other abdominal regions, including retroperitoneum: Secondary | ICD-10-CM

## 2021-11-22 DIAGNOSIS — E039 Hypothyroidism, unspecified: Secondary | ICD-10-CM

## 2021-11-22 DIAGNOSIS — N83201 Unspecified ovarian cyst, right side: Secondary | ICD-10-CM

## 2021-11-22 DIAGNOSIS — M545 Low back pain, unspecified: Secondary | ICD-10-CM

## 2021-11-22 MED ORDER — GABAPENTIN 100 MG PO CAPS: ORAL_CAPSULE | ORAL | 1 refills | Status: DC

## 2021-11-22 MED ORDER — CALCITONIN (SALMON) 200 UNIT/ACT NA SOLN: 1.0000 | Freq: Every day | NASAL | 1 refills | Status: DC

## 2021-11-22 NOTE — Progress Notes (Signed)
Patient ID: Julie Hoover, female   DOB: Sep 06, 1929, 88 y.o.   MRN: [id number]   Subjective:    Patient ID: Julie Hoover, female    DOB: [birth year], 88 y.o.   MRN: [id number]   Patient here for a scheduled follow up .   HPI Here to follow up regarding her blood sugar, blood pressure and continued back pain.  Recent L1 compression fracture.  S/p kyphoplasty.  New T12 compression fracture.  Seeing ortho.  Lidocaine patches and gagapentin.  Was given rx for hydrocodone.  Desires not to take due to GI issues. Also discussed listed allergy.  Still with increased pain.  No pain lying down.  Pain limits activity.  Request to increase gabapentin.     Past Medical History:  Diagnosis Date   Arthritis    Breast cancer ([place]) 1986   s/p right lumpectomy, XRT and Tamoxifen, mastectomy   Cancer ([place])    skin ca   Chicken pox    Colon polyps    Diabetes ([place])    Diverticulosis    Environmental allergies    GERD (gastroesophageal reflux disease)    Glaucoma    Hypercholesterolemia    Hyperglycemia    Hypertension    Hypothyroidism    Osteopenia    Peripheral neuropathy    Personal history of radiation therapy 1986   Urethral stricture    Past Surgical History:  Procedure Laterality Date   BREAST BIOPSY Right 6/86   lumpectomy-positive/rad   BREAST LUMPECTOMY  1986   right   ESOPHAGOGASTRODUODENOSCOPY (EGD) WITH PROPOFOL N/A 12/17/2015   Procedure: ESOPHAGOGASTRODUODENOSCOPY (EGD) WITH PROPOFOL;  Surgeon: Lollie Sails, MD;  Location: [facility] ENDOSCOPY;  Service: Endoscopy;  Laterality: N/A;   EYE SURGERY     Bilateral cataracts removed   IR KYPHO LUMBAR INC FX REDUCE BONE BX UNI/BIL CANNULATION INC/IMAGING  09/26/2021   IR RADIOLOGIST EVAL & MGMT  10/17/2021   KNEE SURGERY  06/2008   torn meniscus, right   KNEE SURGERY  2012   torn menisucs, right   MASTECTOMY Right 1993   right, recurrence   TUBAL LIGATION     URETHRAL DILATION     Family History  Problem  Relation Age of Onset   Heart disease Father    Hypertension Father    COPD Mother    Hypertension Brother        x2   Hypercholesterolemia Brother        x2   Hypertension Sister    Hypercholesterolemia Sister    Diabetes Mellitus II Sister    Cancer Other        mouth, aunt   Colon cancer Neg Hx    Breast cancer Neg Hx    Social History   Socioeconomic History   Marital status: Widowed    Spouse name: Not on file   Number of children: 2   Years of education: Not on file   Highest education level: Not on file  Occupational History   Not on file  Tobacco Use   Smoking status: Never   Smokeless tobacco: Never  Substance and Sexual Activity   Alcohol use: No    Alcohol/week: 0.0 standard drinks of alcohol   Drug use: No   Sexual activity: Never  Other Topics Concern   Not on file  Social History Narrative   Not on file   Social Determinants of Health   Financial Resource Strain: Low Risk  (11/27/2020)   Overall Financial Resource Strain (CARDIA)  Difficulty of Paying Living Expenses: Not hard at all  Food Insecurity: No Food Insecurity (11/18/2021)   Hunger Vital Sign    Worried About Running Out of Food in the Last Year: Never true    Ran Out of Food in the Last Year: Never true  Transportation Needs: No Transportation Needs (11/18/2021)   PRAPARE - Hydrologist (Medical): No    Lack of Transportation (Non-Medical): No  Physical Activity: Not on file  Stress: No Stress Concern Present (11/27/2020)   [place]    Feeling of Stress : Not at all  Social Connections: Unknown (11/27/2020)   Social Connection and Isolation Panel [NHANES]    Frequency of Communication with Friends and Family: More than three times a week    Frequency of Social Gatherings with Friends and Family: More than three times a week    Attends Religious Services: Not on file    Active Member of Clubs  or Organizations: Not on file    Attends Archivist Meetings: Not on file    Marital Status: Widowed     Review of Systems     Objective:     BP 130/72 (BP Location: Left Arm, Patient Position: Sitting, Cuff Size: Small)   Pulse 72   Temp 97.8 F (36.6 C) (Temporal)   Resp 16   Ht _0  (1.626 m)   Wt 122 lb 12.8 oz (55.7 kg)   SpO2 98%   BMI 21.08 kg/m  Wt Readings from Last 3 Encounters:  11/22/21 122 lb 12.8 oz (55.7 kg)  09/05/21 126 lb 6.4 oz (57.3 kg)  08/07/21 131 lb 6.4 oz (59.6 kg)    Physical Exam   Outpatient Encounter Medications as of 11/22/2021  Medication Sig   Accu-Chek Softclix Lancets lancets Use as instructed dx - e11.9   amLODipine (NORVASC) 5 MG tablet TAKE 1 TABLET EVERY DAY   aspirin 81 MG tablet Take 81 mg by mouth daily.   Blood Glucose Monitoring Suppl (ACCU-CHEK AVIVA PLUS) w/Device KIT 1 Device by Does not apply route daily as needed.   calcitonin, salmon, (MIACALCIN) 200 UNIT/ACT nasal spray Place 1 spray into alternate nostrils daily.   Calcium Carbonate-Vitamin D 600-400 MG-UNIT chew tablet Chew 1 tablet by mouth 2 (two) times daily.   carbamide peroxide (DEBROX) 6.5 % OTIC solution 4-5 drops in both ears daily.  Massage for approximately 5 minutes daily.   cetirizine (ZYRTEC) 10 MG tablet Take 10 mg by mouth daily.   Chromium-Cinnamon (CINNAMON PLUS CHROMIUM PO) Take by mouth.   dorzolamide-timolol (COSOPT) 22.3-6.8 MG/ML ophthalmic solution Place 1 drop into the left eye 2 (two) times daily.    glucose blood (ACCU-CHEK AVIVA PLUS) test strip USE AS INSTRUCTED TO CHECK BLOOD SUGAR ONE TIME DAILY   latanoprost (XALATAN) 0.005 % ophthalmic solution Place 1 drop into the left eye at bedtime.   levothyroxine (SYNTHROID) 50 MCG tablet TAKE 1 TABLET EVERY DAY   Multiple Vitamins-Minerals (PRESERVISION AREDS 2) CHEW Chew 2 capsules by mouth.   omeprazole (PRILOSEC) 20 MG capsule TAKE 1 CAPSULE TWICE DAILY   rosuvastatin (CRESTOR) 20 MG  tablet TAKE 1 TABLET EVERY DAY   temazepam (RESTORIL) 30 MG capsule Take 1 capsule (30 mg total) by mouth at bedtime as needed.   [DISCONTINUED] gabapentin (NEURONTIN) 100 MG capsule TAKE 1 CAPSULE EVERY MORNING AND TAKE 2 CAPSULES AT BEDTIME.   gabapentin (NEURONTIN) 100 MG capsule TAKE  2 CAPSULES TID   No facility-administered encounter medications on file as of 11/22/2021.     Lab Results  Component Value Date   WBC 9.3 11/13/2021   HGB 12.7 11/13/2021   HCT 38.8 11/13/2021   PLT 251.0 11/13/2021   GLUCOSE 96 11/13/2021   CHOL 164 11/13/2021   TRIG 89.0 11/13/2021   HDL 68.40 11/13/2021   LDLCALC 78 11/13/2021   ALT 9 11/13/2021   AST 14 11/13/2021   NA 140 11/13/2021   K 4.1 11/13/2021   CL 102 11/13/2021   CREATININE 0.77 11/13/2021   BUN 15 11/13/2021   CO2 30 11/13/2021   TSH 1.94 11/13/2021   HGBA1C 6.5 11/13/2021   MICROALBUR <0.7 01/31/2021    MR THORACIC SPINE WO CONTRAST  Result Date: 10/18/2021 CLINICAL DATA:  Thoracic pain.  Recent kyphoplasty. EXAM: MRI THORACIC SPINE WITHOUT CONTRAST TECHNIQUE: Multiplanar, multisequence MR imaging of the thoracic spine was performed. No intravenous contrast was administered. COMPARISON:  Lumbar spine radiographs 10/17/2021. MRI lumbar spine 08/22/2021. FINDINGS: Alignment: In no significant listhesis is present. Thoracic kyphosis is preserved. Vertebrae: A new inferior endplate fractures present at T12 anteriorly 30% loss of height is present. Edema at spreads to nearly the superior endplate. Interval vertebral augmentation noted at L1. Cement extrudes superiorly into the T12-L1 disc space. No other acute fractures are present. Superior endplate Schmorl's nodes are present at T9 and T10. Marrow signal and vertebral body heights are otherwise normal. Cord:  Normal signal and morphology. Paraspinal and other soft tissues: Dependent airspace disease is present bilaterally, right greater than left. Visualized lung fields and mediastinum  are otherwise unremarkable. Paraspinous musculature is within normal limits. Disc levels: C7-T1: Slight anterolisthesis is present. No significant stenosis is present. A shallow disc protrusion is present at T6-7 without significant stenosis. Right foraminal narrowing is secondary to facet disease at T9-10 and T10-11. Left foraminal narrowing is due to facet hypertrophy at T8-9 and T9-10. Slight retropulsion of bone at L1 extends into the ventral CSF space without significant stenosis or change. IMPRESSION: 1. Acute/subacute inferior endplate compression fracture at T12 with 30% loss of height. 2. Vertebral augmentation at L1. 3. No other acute fractures. 4. Right foraminal narrowing at T9-10 and T10-11 secondary to facet hypertrophy. 5. Left foraminal narrowing at T8-9 and T9-10 due to facet hypertrophy. 6. Slight retropulsion of bone at L1 extends into the ventral CSF space without significant stenosis or change. Electronically Signed   By: San Morelle M.D.   On: 10/18/2021 14:52       Assessment & Plan:   Problem List Items Addressed This Visit     Abnormal CT of the abdomen    Scarring and likely sequelae of chronic/indolent infection in the lung bases noted.  Breathing stable. Given above issues, will hold on pulmonary evaluation.       B12 deficiency    Continues on oral B12.  Will recheck with next labs to confirm level wnl.       Back pain    Increased pain as outlined.  Compression fracture as outlined.  Increase gabapentin.  Continue lidocaine patches.       Compression fracture of L1 vertebra ([facility])    S/p kyphoplasty.  F/u revealed a new T12 compression fracture.  Seeing ortho as outlined.  Continue lidocaine patches.  Increase gabapentin as outlined.  Follow.  Not taking hydrocodone due to concern regarding bowel issues.  Also discussed her listed allergy to hydrocodone.  GERD (gastroesophageal reflux disease)    No acid reflux reported.  On protonix. No dysphagia  reported.  Follow.       History of breast cancer    Mammogram 01/21/21 - Birads I.       Hypercholesterolemia    Continue crestor.  Low cholesterol diet and exercise.  Follow lipid panel and liver function tests.        Hypertension    Continue amlodipine. Follow pressures.  Follow metabolic panel.       Hypothyroidism    On thyroid replacement.  Follow tsh.        Ovarian cyst    Noted on recent CT scan.  (2.2cm).  Compared to a previous scan and felt to be a benign process given slow growth.  Will discuss with her regarding f/u.  Has seen gyn previously for f/u ovarian cyst.       Peripheral neuropathy    On gabapentin.  Discussed increasing dose to see if would help with her back pain.  Increase to 290m tid.  Follow. Discussed possible side effects.        Relevant Medications   gabapentin (NEURONTIN) 100 MG capsule   Type 2 diabetes mellitus with diabetic polyneuropathy, without long-term current use of insulin ([place]     Follow met b and a1c. Reviewed outside sugars.  Lab Results  Component Value Date   HGBA1C 6.5 11/13/2021         Relevant Medications   gabapentin (NEURONTIN) 100 MG capsule     CEinar Pheasant MD

## 2021-11-24 ENCOUNTER — Encounter: Payer: Self-pay | Admitting: Internal Medicine

## 2021-11-24 NOTE — Assessment & Plan Note (Signed)
Continue crestor.  Low cholesterol diet and exercise. Follow lipid panel and liver function tests.   

## 2021-11-24 NOTE — Assessment & Plan Note (Signed)
No acid reflux reported.  On protonix. No dysphagia reported.  Follow.

## 2021-11-24 NOTE — Assessment & Plan Note (Addendum)
S/p kyphoplasty.  F/u revealed a new T12 compression fracture.  Seeing ortho as outlined.  Continue lidocaine patches.  Increase gabapentin as outlined.  Follow.  Not taking hydrocodone due to concern regarding bowel issues.  Also discussed her listed allergy to hydrocodone.

## 2021-11-24 NOTE — Assessment & Plan Note (Signed)
Noted on recent CT scan.  (2.2cm).  Compared to a previous scan and felt to be a benign process given slow growth.  Will discuss with her regarding f/u.  Has seen gyn previously for f/u ovarian cyst.

## 2021-11-24 NOTE — Assessment & Plan Note (Signed)
Scarring and likely sequelae of chronic/indolent infection in the lung bases noted.  Breathing stable. Given above issues, will hold on pulmonary evaluation.

## 2021-11-24 NOTE — Assessment & Plan Note (Signed)
Mammogram 01/21/21 - Birads I.

## 2021-11-24 NOTE — Assessment & Plan Note (Signed)
On gabapentin.  Discussed increasing dose to see if would help with her back pain.  Increase to '200mg'$  tid.  Follow. Discussed possible side effects.

## 2021-11-24 NOTE — Assessment & Plan Note (Signed)
Follow met b and a1c. Reviewed outside sugars.  Lab Results  Component Value Date   HGBA1C 6.5 11/13/2021

## 2021-11-24 NOTE — Assessment & Plan Note (Signed)
Continue amlodipine.  Follow pressures.  Follow metabolic panel.   

## 2021-11-24 NOTE — Assessment & Plan Note (Signed)
On thyroid replacement.  Follow tsh.  

## 2021-11-24 NOTE — Assessment & Plan Note (Signed)
Continues on oral B12.  Will recheck with next labs to confirm level wnl.

## 2021-11-24 NOTE — Assessment & Plan Note (Signed)
Increased pain as outlined.  Compression fracture as outlined.  Increase gabapentin.  Continue lidocaine patches.

## 2021-11-27 ENCOUNTER — Ambulatory Visit: Payer: Medicare HMO | Admitting: Internal Medicine

## 2021-11-28 ENCOUNTER — Ambulatory Visit (INDEPENDENT_AMBULATORY_CARE_PROVIDER_SITE_OTHER): Payer: Medicare HMO

## 2021-11-28 VITALS — Ht 64.0 in | Wt 122.0 lb

## 2021-11-28 DIAGNOSIS — Z0000 Encounter for general adult medical examination without abnormal findings: Secondary | ICD-10-CM | POA: Diagnosis not present

## 2021-11-28 NOTE — Progress Notes (Signed)
Subjective:   Julie Hoover is a 88 y.o. female who presents for Medicare Annual (Subsequent) preventive examination.  Review of Systems    No ROS.  Medicare Wellness Virtual Visit.  Visual/audio telehealth visit, UTA vital signs.   See social history for additional risk factors.   Cardiac Risk Factors include: advanced age (>55mn, >>25women);hypertension;diabetes mellitus     Objective:    Today's Vitals   11/28/21 0953  Weight: 122 lb (55.3 kg)  Height: 5' 4"  (1.626 m)   Body mass index is 20.94 kg/m.     11/28/2021    9:54 AM 11/27/2020    9:21 AM 11/23/2019    9:12 AM 10/28/2016   10:32 AM 12/17/2015    8:33 AM 10/29/2015    9:53 AM  Advanced Directives  Does Patient Have a Medical Advance Directive? Yes Yes Yes Yes Yes Yes  Type of AParamed[place]of [place]Living will [place] will Healthcare Power of Attorney Living will;Healthcare Power of [place]  Does patient want to make changes to medical advance directive? No - Patient declined No - Patient declined No - Patient declined No - Patient declined    Copy of HSour Johnin Chart? No - copy requested No - copy requested No - copy requested No - copy requested Yes No - copy requested    Current Medications (verified) Outpatient Encounter Medications as of 11/28/2021  Medication Sig   Accu-Chek Softclix Lancets lancets Use as instructed dx - e11.9   amLODipine (NORVASC) 5 MG tablet TAKE 1 TABLET EVERY DAY   aspirin 81 MG tablet Take 81 mg by mouth daily.   Blood Glucose Monitoring Suppl (ACCU-CHEK AVIVA PLUS) w/Device KIT 1 Device by Does not apply route daily as needed.   calcitonin, salmon, (MIACALCIN) 200 UNIT/ACT nasal spray Place 1 spray into alternate nostrils daily.   Calcium Carbonate-Vitamin D 600-400 MG-UNIT chew tablet Chew 1 tablet by mouth 2 (two) times daily.   carbamide peroxide (DEBROX) 6.5  % OTIC solution 4-5 drops in both ears daily.  Massage for approximately 5 minutes daily.   cetirizine (ZYRTEC) 10 MG tablet Take 10 mg by mouth daily.   Chromium-Cinnamon (CINNAMON PLUS CHROMIUM PO) Take by mouth.   dorzolamide-timolol (COSOPT) 22.3-6.8 MG/ML ophthalmic solution Place 1 drop into the left eye 2 (two) times daily.    gabapentin (NEURONTIN) 100 MG capsule TAKE 2 CAPSULES TID   glucose blood (ACCU-CHEK AVIVA PLUS) test strip USE AS INSTRUCTED TO CHECK BLOOD SUGAR ONE TIME DAILY   latanoprost (XALATAN) 0.005 % ophthalmic solution Place 1 drop into the left eye at bedtime.   levothyroxine (SYNTHROID) 50 MCG tablet TAKE 1 TABLET EVERY DAY   Multiple Vitamins-Minerals (PRESERVISION AREDS 2) CHEW Chew 2 capsules by mouth.   omeprazole (PRILOSEC) 20 MG capsule TAKE 1 CAPSULE TWICE DAILY   rosuvastatin (CRESTOR) 20 MG tablet TAKE 1 TABLET EVERY DAY   temazepam (RESTORIL) 30 MG capsule Take 1 capsule (30 mg total) by mouth at bedtime as needed.   No facility-administered encounter medications on file as of 11/28/2021.    Allergies (verified) Iodinated contrast media, Ciprofloxacin, Gentian root, Halothane, Hydrocodone, Norco [hydrocodone-acetaminophen], and Sulfa antibiotics   History: Past Medical History:  Diagnosis Date   Arthritis    [place]   s/p right lumpectomy, XRT and Tamoxifen, mastectomy   [place]    skin ca   Chicken pox  Colon polyps    Diabetes ([place])    Diverticulosis    Environmental allergies    GERD (gastroesophageal reflux disease)    Glaucoma    Hypercholesterolemia    Hyperglycemia    Hypertension    Hypothyroidism    Osteopenia    Peripheral neuropathy    Personal history of radiation therapy 1986   Urethral stricture    Past Surgical History:  Procedure Laterality Date   BREAST BIOPSY Right 6/86   lumpectomy-positive/rad   BREAST LUMPECTOMY  1986   right   ESOPHAGOGASTRODUODENOSCOPY (EGD) WITH PROPOFOL N/A 12/17/2015    Procedure: ESOPHAGOGASTRODUODENOSCOPY (EGD) WITH PROPOFOL;  Surgeon: Lollie Sails, MD;  Location: [facility] ENDOSCOPY;  Service: Endoscopy;  Laterality: N/A;   EYE SURGERY     Bilateral cataracts removed   IR KYPHO LUMBAR INC FX REDUCE BONE BX UNI/BIL CANNULATION INC/IMAGING  09/26/2021   IR RADIOLOGIST EVAL & MGMT  10/17/2021   KNEE SURGERY  06/2008   torn meniscus, right   KNEE SURGERY  2012   torn menisucs, right   MASTECTOMY Right 1993   right, recurrence   TUBAL LIGATION     URETHRAL DILATION     Family History  Problem Relation Age of Onset   COPD Mother    Heart disease Father    Hypertension Father    Hypertension Sister    Hypercholesterolemia Sister    Diabetes Mellitus II Sister    Hypertension Brother        x2   Hypercholesterolemia Brother        x2   Cancer Son        Stomach   Cancer Other        mouth, aunt   Colon cancer Neg Hx    Breast cancer Neg Hx    Social History   Socioeconomic History   Marital status: Widowed    Spouse name: Not on file   Number of children: 2   Years of education: Not on file   Highest education level: Not on file  Occupational History   Not on file  Tobacco Use   Smoking status: Never   Smokeless tobacco: Never  Substance and Sexual Activity   Alcohol use: No    Alcohol/week: 0.0 standard drinks of alcohol   Drug use: No   Sexual activity: Never  Other Topics Concern   Not on file  Social History Narrative   Not on file   Social Determinants of Health   Financial Resource Strain: Low Risk  (11/28/2021)   Overall Financial Resource Strain (CARDIA)    Difficulty of Paying Living Expenses: Not hard at all  Food Insecurity: No Food Insecurity (11/28/2021)   Hunger Vital Sign    Worried About Running Out of Food in the Last Year: Never true    Ran Out of Food in the Last Year: Never true  Transportation Needs: No Transportation Needs (11/28/2021)   PRAPARE - Hydrologist (Medical): No     Lack of Transportation (Non-Medical): No  Physical Activity: Not on file  Stress: No Stress Concern Present (11/28/2021)   [place]    Feeling of Stress : Not at all  Social Connections: Unknown (11/28/2021)   Social Connection and Isolation Panel [NHANES]    Frequency of Communication with Friends and Family: More than three times a week    Frequency of Social Gatherings with Friends and Family: More than three  times a week    Attends Religious Services: Not on file    Active Member of Clubs or Organizations: Not on file    Attends Club or Organization Meetings: Not on file    Marital Status: Widowed    Tobacco Counseling Counseling given: Not Answered   Clinical Intake:  Pre-visit preparation completed: Yes        Diabetes: Yes (Followed by PCP)  How often do you need to have someone help you when you read instructions, pamphlets, or other written materials from your doctor or pharmacy?: 1 - Never    Interpreter Needed?: No      Activities of Daily Living    11/28/2021    9:48 AM  In your present state of health, do you have any difficulty performing the following activities:  Hearing? 0  Vision? 1  Comment Wet macular R eye. Glaucoma; drops in use L eye.  Difficulty concentrating or making decisions? 0  Walking or climbing stairs? 0  Dressing or bathing? 0  Doing errands, shopping? 1  Comment [place] and eating ? Y  Comment Meal prep assist. Self feeds.  Using the Toilet? N  In the past six months, have you accidently leaked urine? N  Do you have problems with loss of bowel control? N  Managing your Medications? Y  Comment Daughter and caregiver assist  Managing your Finances? N  Housekeeping or managing your Housekeeping? Y  Comment Family/caregiver assist    Patient Care Team: Einar Pheasant, MD as PCP - General (Internal Medicine)  Indicate any recent  Medical Services you may have received from other than [facility] providers in the past year (date may be approximate).     Assessment:   This is a routine wellness examination for [facility].  Virtual Visit via Telephone Note  I connected with  Julie Hoover on 11/28/21 at  9:45 AM EDT by telephone and verified that I am speaking with the correct person using two identifiers.  Location: Patient: home Provider: office Persons participating in the virtual visit: patient/Nurse Health Advisor   I discussed the limitations of performing an evaluation and management service by telehealth. We continued and completed visit with audio only. Some vital signs may be absent or patient reported.   Hearing/Vision screen Hearing Screening - Comments:: Patient is able to hear conversational tones without difficulty. No issues reported. Vision Screening - Comments:: Followed by [facility], Dr. Edison Pace Wears corrective lenses  Cataract extraction, bilateral  Glaucoma; drops in use, L eye Wet macular degeneration, R eye They have seen their ophthalmologist in the last 6 months.  Dietary issues and exercise activities discussed: Current Exercise Habits: Home exercise routine, Type of exercise: walking (Indoor walking), Intensity: Mild Healthy diet Good water intake FBS 95   Goals Addressed               This Visit's Progress     Patient Stated     Maintain Healthy Lifestyle (pt-stated)        Stay active; use walker when ambulating Healthy diet Stay hydrated       Depression Screen    11/28/2021    9:59 AM 11/22/2021    3:36 PM 07/16/2021    2:48 PM 02/05/2021   10:34 AM 11/27/2020    9:24 AM 10/01/2020   10:45 AM 11/23/2019    9:15 AM  PHQ 2/9 Scores  PHQ - 2 Score 0 1 0 0 0 0 0  Fall Risk    11/28/2021   10:11 AM 11/22/2021    3:36 PM 07/16/2021    2:47 PM 07/16/2021    2:38 PM 02/05/2021   10:33 AM  Fall Risk   Falls in the past year?  1 0 0 1  Number falls in past yr:   1   0  Injury with Fall?  1   0  Risk for fall due to :  No Fall Risks No Fall Risks No Fall Risks History of fall(s)  Follow up Falls evaluation completed Falls evaluation completed Falls evaluation completed Falls evaluation completed Falls evaluation completed    [place]: Home free of loose throw rugs in walkways, pet beds, electrical cords, etc? Yes  Adequate lighting in your home to reduce risk of falls? Yes   ASSISTIVE DEVICES UTILIZED TO PREVENT FALLS: Life alert? No  Use of a cane, walker or w/c? Yes , walker as needed. Grab bars in the bathroom? Yes  Shower chair or bench in shower? Yes  Elevated toilet seat or a handicapped toilet? Yes   TIMED UP AND GO: Was the test performed? No .   Cognitive Function: Patient is alert and oriented x3.  Enjoys reading novels.      10/28/2016   10:38 AM 10/29/2015   10:10 AM  MMSE - Mini Mental State Exam  Orientation to time 5 5  Orientation to Place 5 5  Registration 3 3  Attention/ Calculation 5 5  Recall 3 3  Language- name 2 objects 2 2  Language- repeat 1 1  Language- follow 3 step command 3 3  Language- read & follow direction 1 1  Write a sentence 1 1  Copy design 1 1  Total score 30 30        11/28/2021   10:13 AM 11/27/2020    9:44 AM 11/23/2019    9:26 AM  6CIT Screen  What Year? 0 points 0 points 0 points  What month? 0 points 0 points 0 points  What time? 0 points 0 points 0 points  Count back from 20 0 points    Months in reverse 0 points  0 points  Repeat phrase 0 points    Total Score 0 points      Immunizations Immunization History  Administered Date(s) Administered   Fluad Quad(high Dose 65+) 12/21/2018, 01/09/2020, 02/05/2021   Influenza Split 01/18/2012   Influenza, High Dose Seasonal PF 01/31/2016, 01/20/2017, 01/05/2018   Influenza,inj,Quad PF,6+ Mos 01/12/2013, 12/14/2013, 01/09/2015   PFIZER(Purple Top)SARS-COV-2 Vaccination 01/18/2020, 02/08/2020,  10/23/2020   Pneumococcal Conjugate-13 05/17/2013   Pneumococcal Polysaccharide-23 01/29/2015   Tdap 01/17/2013   Zoster Recombinat (Shingrix) 05/31/2020, 07/30/2020   Zoster, Live 03/26/2006   Screening Tests Health Maintenance  Topic Date Due   COVID-19 Vaccine (4 - Pfizer series) 12/08/2021 (Originally 12/18/2020)   INFLUENZA VACCINE  12/20/2021 (Originally 11/19/2021)   FOOT EXAM  01/17/2022 (Originally 10/01/2021)   MAMMOGRAM  01/14/2022   URINE MICROALBUMIN  01/31/2022   HEMOGLOBIN A1C  05/16/2022   OPHTHALMOLOGY EXAM  09/25/2022   TETANUS/TDAP  01/18/2023   Pneumonia Vaccine 72+ Years old  Completed   DEXA SCAN  Completed   Zoster Vaccines- Shingrix  Completed   HPV VACCINES  Aged Out   Health Maintenance There are no preventive care reminders to display for this patient.  Lung Cancer Screening: (Low Dose CT Chest recommended if Age 40-80 years, 30 pack-year currently smoking OR have quit w/in  15years.) does not qualify.   Hepatitis C Screening: does not qualify.  Vision Screening: Recommended annual ophthalmology exams for early detection of glaucoma and other disorders of the eye.  Dental Screening: Recommended annual dental exams for proper oral hygiene  Community Resource Referral / Chronic Care Management: CRR required this visit?  No   CCM required this visit?  No      Plan:   Keep all routine maintenance appointments.   I have personally reviewed and noted the following in the patient's chart:   Medical and social history Use of alcohol, tobacco or illicit drugs  Current medications and supplements including opioid prescriptions.  Functional ability and status Nutritional status Physical activity Advanced directives List of other physicians Hospitalizations, surgeries, and ER visits in previous 12 months Vitals Screenings to include cognitive, depression, and falls Referrals and appointments  In addition, I have reviewed and discussed with patient  certain preventive protocols, quality metrics, and best practice recommendations. A written personalized care plan for preventive services as well as general preventive health recommendations were provided to patient.     Varney Biles, LPN   06/06/2444

## 2021-11-28 NOTE — Patient Instructions (Addendum)
  Julie Hoover , Thank you for taking time to come for your Medicare Wellness Visit. I appreciate your ongoing commitment to your health goals. Please review the following plan we discussed and let me know if I can assist you in the future.   These are the goals we discussed:  Goals       Patient Stated     Maintain Healthy Lifestyle (pt-stated)      Stay active; use walker when ambulating Healthy diet Stay hydrated        This is a list of the screening recommended for you and due dates:  Health Maintenance  Topic Date Due   COVID-19 Vaccine (4 - Pfizer series) 12/08/2021*   Flu Shot  12/20/2021*   Complete foot exam   01/17/2022*   Mammogram  01/14/2022   Urine Protein Check  01/31/2022   Hemoglobin A1C  05/16/2022   Eye exam for diabetics  09/25/2022   Tetanus Vaccine  01/18/2023   Pneumonia Vaccine  Completed   DEXA scan (bone density measurement)  Completed   Zoster (Shingles) Vaccine  Completed   HPV Vaccine  Aged Out  *Topic was postponed. The date shown is not the original due date.

## 2021-12-04 ENCOUNTER — Other Ambulatory Visit: Payer: Self-pay | Admitting: Internal Medicine

## 2021-12-04 DIAGNOSIS — Z1231 Encounter for screening mammogram for malignant neoplasm of breast: Secondary | ICD-10-CM

## 2021-12-09 ENCOUNTER — Ambulatory Visit: Payer: Medicare HMO | Admitting: Internal Medicine

## 2021-12-27 ENCOUNTER — Other Ambulatory Visit: Payer: Self-pay | Admitting: Internal Medicine

## 2021-12-27 DIAGNOSIS — E1142 Type 2 diabetes mellitus with diabetic polyneuropathy: Secondary | ICD-10-CM

## 2022-01-05 ENCOUNTER — Other Ambulatory Visit: Payer: Self-pay | Admitting: Internal Medicine

## 2022-01-06 ENCOUNTER — Ambulatory Visit (INDEPENDENT_AMBULATORY_CARE_PROVIDER_SITE_OTHER): Payer: Medicare HMO | Admitting: Internal Medicine

## 2022-01-06 ENCOUNTER — Encounter: Payer: Self-pay | Admitting: Internal Medicine

## 2022-01-06 VITALS — BP 138/68 | HR 53 | Temp 97.5°F | Ht 64.0 in | Wt 124.2 lb

## 2022-01-06 DIAGNOSIS — N83201 Unspecified ovarian cyst, right side: Secondary | ICD-10-CM | POA: Diagnosis not present

## 2022-01-06 DIAGNOSIS — K2100 Gastro-esophageal reflux disease with esophagitis, without bleeding: Secondary | ICD-10-CM | POA: Diagnosis not present

## 2022-01-06 DIAGNOSIS — G6289 Other specified polyneuropathies: Secondary | ICD-10-CM | POA: Diagnosis not present

## 2022-01-06 DIAGNOSIS — E7800 Pure hypercholesterolemia, unspecified: Secondary | ICD-10-CM

## 2022-01-06 DIAGNOSIS — S32010D Wedge compression fracture of first lumbar vertebra, subsequent encounter for fracture with routine healing: Secondary | ICD-10-CM | POA: Diagnosis not present

## 2022-01-06 DIAGNOSIS — F439 Reaction to severe stress, unspecified: Secondary | ICD-10-CM

## 2022-01-06 DIAGNOSIS — E039 Hypothyroidism, unspecified: Secondary | ICD-10-CM | POA: Diagnosis not present

## 2022-01-06 DIAGNOSIS — E1142 Type 2 diabetes mellitus with diabetic polyneuropathy: Secondary | ICD-10-CM

## 2022-01-06 DIAGNOSIS — I10 Essential (primary) hypertension: Secondary | ICD-10-CM | POA: Diagnosis not present

## 2022-01-06 DIAGNOSIS — Z853 Personal history of malignant neoplasm of breast: Secondary | ICD-10-CM

## 2022-01-06 DIAGNOSIS — M545 Low back pain, unspecified: Secondary | ICD-10-CM

## 2022-01-06 DIAGNOSIS — Z23 Encounter for immunization: Secondary | ICD-10-CM | POA: Diagnosis not present

## 2022-01-06 DIAGNOSIS — R1310 Dysphagia, unspecified: Secondary | ICD-10-CM

## 2022-01-06 NOTE — Progress Notes (Signed)
Patient ID: Julie Hoover, female   DOB: Aug 20, 1929, 88 y.o.   MRN: [id number]   Subjective:    Patient ID: Julie Hoover, female    DOB: 1930-02-16, 88 y.o.   MRN: [id number]  Patient here for  Chief Complaint  Patient presents with   Follow-up    8 week follow up   .   HPI Here to follow up regarding her blood pressure, blood sugar and back pain.  She is accompanied by her daughter.  History obtained from both of them. Had L1 compression fracture.  S/p kyphoplasty.  Also T12 compression fracture.  Still with pain.  Taking gabapentin.  Discussed adjusting dose - gradually.  Taking tid.  No chest pain.  Breathing stable.  No increased cough or congestion.  No abdominal pain.  Blood sugars doing well - 90-108.  No low sugars.  States is off cinnamon.  Discussed recent scan with mention of right ovarian cyst.  Discussed f/u with gyn to confirm no further w/up warranted.     Past Medical History:  Diagnosis Date   Arthritis    Breast cancer ([place]) 1986   s/p right lumpectomy, XRT and Tamoxifen, mastectomy   Cancer ([place])    skin ca   Chicken pox    Colon polyps    Diabetes ([place])    Diverticulosis    Environmental allergies    GERD (gastroesophageal reflux disease)    Glaucoma    Hypercholesterolemia    Hyperglycemia    Hypertension    Hypothyroidism    Osteopenia    Peripheral neuropathy    Personal history of radiation therapy 1986   Urethral stricture    Past Surgical History:  Procedure Laterality Date   BREAST BIOPSY Right 6/86   lumpectomy-positive/rad   BREAST LUMPECTOMY  1986   right   ESOPHAGOGASTRODUODENOSCOPY (EGD) WITH PROPOFOL N/A 12/17/2015   Procedure: ESOPHAGOGASTRODUODENOSCOPY (EGD) WITH PROPOFOL;  Surgeon: Lollie Sails, MD;  Location: [facility] ENDOSCOPY;  Service: Endoscopy;  Laterality: N/A;   EYE SURGERY     Bilateral cataracts removed   IR KYPHO LUMBAR INC FX REDUCE BONE BX UNI/BIL CANNULATION INC/IMAGING  09/26/2021   IR RADIOLOGIST EVAL  & MGMT  10/17/2021   KNEE SURGERY  06/2008   torn meniscus, right   KNEE SURGERY  2012   torn menisucs, right   MASTECTOMY Right 1993   right, recurrence   TUBAL LIGATION     URETHRAL DILATION     Family History  Problem Relation Age of Onset   COPD Mother    Heart disease Father    Hypertension Father    Hypertension Sister    Hypercholesterolemia Sister    Diabetes Mellitus II Sister    Hypertension Brother        x2   Hypercholesterolemia Brother        x2   Cancer Son        Stomach   Cancer Other        mouth, aunt   Colon cancer Neg Hx    Breast cancer Neg Hx    Social History   Socioeconomic History   Marital status: Widowed    Spouse name: Not on file   Number of children: 2   Years of education: Not on file   Highest education level: Not on file  Occupational History   Not on file  Tobacco Use   Smoking status: Never   Smokeless tobacco: Never  Substance and Sexual Activity  Alcohol use: No    Alcohol/week: 0.0 standard drinks of alcohol   Drug use: No   Sexual activity: Never  Other Topics Concern   Not on file  Social History Narrative   Not on file   Social Determinants of Health   Financial Resource Strain: Low Risk  (11/28/2021)   Overall Financial Resource Strain (CARDIA)    Difficulty of Paying Living Expenses: Not hard at all  Food Insecurity: No Food Insecurity (11/28/2021)   Hunger Vital Sign    Worried About Running Out of Food in the Last Year: Never true    Ran Out of Food in the Last Year: Never true  Transportation Needs: No Transportation Needs (11/28/2021)   PRAPARE - Hydrologist (Medical): No    Lack of Transportation (Non-Medical): No  Physical Activity: Not on file  Stress: No Stress Concern Present (11/28/2021)   [place]    Feeling of Stress : Not at all  Social Connections: Unknown (11/28/2021)   Social Connection and  Isolation Panel [NHANES]    Frequency of Communication with Friends and Family: More than three times a week    Frequency of Social Gatherings with Friends and Family: More than three times a week    Attends Religious Services: Not on file    Active Member of Clubs or Organizations: Not on file    Attends Archivist Meetings: Not on file    Marital Status: Widowed     Review of Systems  Constitutional:  Negative for appetite change and unexpected weight change.  HENT:  Negative for congestion and sinus pressure.   Respiratory:  Negative for cough and chest tightness.        Breathing stable.   Cardiovascular:  Negative for chest pain, palpitations and leg swelling.  Gastrointestinal:  Negative for abdominal pain, diarrhea, nausea and vomiting.  Genitourinary:  Negative for difficulty urinating and dysuria.  Musculoskeletal:  Positive for back pain. Negative for joint swelling and myalgias.  Skin:  Negative for color change and rash.  Neurological:  Negative for dizziness, light-headedness and headaches.  Psychiatric/Behavioral:  Negative for agitation and dysphoric mood.        Objective:     BP 138/68 (BP Location: Left Arm, Patient Position: Sitting, Cuff Size: Normal)   Pulse (!) 53   Temp (!) 97.5 F (36.4 C) (Oral)   Ht 5' 4"  (1.626 m)   Wt 124 lb 3.2 oz (56.3 kg)   SpO2 94%   BMI 21.32 kg/m  Wt Readings from Last 3 Encounters:  01/06/22 124 lb 3.2 oz (56.3 kg)  11/28/21 122 lb (55.3 kg)  11/22/21 122 lb 12.8 oz (55.7 kg)    Physical Exam Vitals reviewed.  Constitutional:      General: She is not in acute distress.    Appearance: Normal appearance.  HENT:     Head: Normocephalic and atraumatic.     Right Ear: External ear normal.     Left Ear: External ear normal.  Eyes:     General: No scleral icterus.       Right eye: No discharge.        Left eye: No discharge.     Conjunctiva/sclera: Conjunctivae normal.  Neck:     Thyroid: No thyromegaly.   Cardiovascular:     Rate and Rhythm: Normal rate and regular rhythm.  Pulmonary:     Effort: No respiratory distress.  Breath sounds: Normal breath sounds. No wheezing.  Abdominal:     General: Bowel sounds are normal.     Palpations: Abdomen is soft.     Tenderness: There is no abdominal tenderness.  Musculoskeletal:        General: No swelling or tenderness.     Cervical back: Neck supple. No tenderness.  Lymphadenopathy:     Cervical: No cervical adenopathy.  Skin:    Findings: No erythema or rash.  Neurological:     Mental Status: She is alert.  Psychiatric:        Mood and Affect: Mood normal.        Behavior: Behavior normal.      Outpatient Encounter Medications as of 01/06/2022  Medication Sig   Accu-Chek Softclix Lancets lancets USE AS INSTRUCTED TO CHECK BLOOD SUGAR ONE TIME DAILY   aspirin 81 MG tablet Take 81 mg by mouth daily.   Blood Glucose Monitoring Suppl (ACCU-CHEK AVIVA PLUS) w/Device KIT 1 Device by Does not apply route daily as needed.   calcitonin, salmon, (MIACALCIN) 200 UNIT/ACT nasal spray Place 1 spray into alternate nostrils daily.   Calcium Carbonate-Vitamin D 600-400 MG-UNIT chew tablet Chew 1 tablet by mouth 2 (two) times daily.   carbamide peroxide (DEBROX) 6.5 % OTIC solution 4-5 drops in both ears daily.  Massage for approximately 5 minutes daily.   cetirizine (ZYRTEC) 10 MG tablet Take 10 mg by mouth daily.   Chromium-Cinnamon (CINNAMON PLUS CHROMIUM PO) Take by mouth.   dorzolamide-timolol (COSOPT) 22.3-6.8 MG/ML ophthalmic solution Place 1 drop into the left eye 2 (two) times daily.    gabapentin (NEURONTIN) 100 MG capsule TAKE 2 CAPSULES TID   glucose blood (ACCU-CHEK AVIVA PLUS) test strip USE AS INSTRUCTED TO CHECK BLOOD SUGAR ONE TIME DAILY   latanoprost (XALATAN) 0.005 % ophthalmic solution Place 1 drop into the left eye at bedtime.   levothyroxine (SYNTHROID) 50 MCG tablet TAKE 1 TABLET EVERY DAY   Multiple Vitamins-Minerals  (PRESERVISION AREDS 2) CHEW Chew 2 capsules by mouth.   omeprazole (PRILOSEC) 20 MG capsule TAKE 1 CAPSULE TWICE DAILY   rosuvastatin (CRESTOR) 20 MG tablet TAKE 1 TABLET EVERY DAY   temazepam (RESTORIL) 30 MG capsule Take 1 capsule (30 mg total) by mouth at bedtime as needed.   [DISCONTINUED] amLODipine (NORVASC) 5 MG tablet TAKE 1 TABLET EVERY DAY   No facility-administered encounter medications on file as of 01/06/2022.     Lab Results  Component Value Date   WBC 9.3 11/13/2021   HGB 12.7 11/13/2021   HCT 38.8 11/13/2021   PLT 251.0 11/13/2021   GLUCOSE 96 11/13/2021   CHOL 164 11/13/2021   TRIG 89.0 11/13/2021   HDL 68.40 11/13/2021   LDLCALC 78 11/13/2021   ALT 9 11/13/2021   AST 14 11/13/2021   NA 140 11/13/2021   K 4.1 11/13/2021   CL 102 11/13/2021   CREATININE 0.77 11/13/2021   BUN 15 11/13/2021   CO2 30 11/13/2021   TSH 1.94 11/13/2021   HGBA1C 6.5 11/13/2021   MICROALBUR <0.7 01/31/2021    MR THORACIC SPINE WO CONTRAST  Result Date: 10/18/2021 CLINICAL DATA:  Thoracic pain.  Recent kyphoplasty. EXAM: MRI THORACIC SPINE WITHOUT CONTRAST TECHNIQUE: Multiplanar, multisequence MR imaging of the thoracic spine was performed. No intravenous contrast was administered. COMPARISON:  Lumbar spine radiographs 10/17/2021. MRI lumbar spine 08/22/2021. FINDINGS: Alignment: In no significant listhesis is present. Thoracic kyphosis is preserved. Vertebrae: A new inferior endplate fractures present at T12  anteriorly 30% loss of height is present. Edema at spreads to nearly the superior endplate. Interval vertebral augmentation noted at L1. Cement extrudes superiorly into the T12-L1 disc space. No other acute fractures are present. Superior endplate Schmorl's nodes are present at T9 and T10. Marrow signal and vertebral body heights are otherwise normal. Cord:  Normal signal and morphology. Paraspinal and other soft tissues: Dependent airspace disease is present bilaterally, right greater  than left. Visualized lung fields and mediastinum are otherwise unremarkable. Paraspinous musculature is within normal limits. Disc levels: C7-T1: Slight anterolisthesis is present. No significant stenosis is present. A shallow disc protrusion is present at T6-7 without significant stenosis. Right foraminal narrowing is secondary to facet disease at T9-10 and T10-11. Left foraminal narrowing is due to facet hypertrophy at T8-9 and T9-10. Slight retropulsion of bone at L1 extends into the ventral CSF space without significant stenosis or change. IMPRESSION: 1. Acute/subacute inferior endplate compression fracture at T12 with 30% loss of height. 2. Vertebral augmentation at L1. 3. No other acute fractures. 4. Right foraminal narrowing at T9-10 and T10-11 secondary to facet hypertrophy. 5. Left foraminal narrowing at T8-9 and T9-10 due to facet hypertrophy. 6. Slight retropulsion of bone at L1 extends into the ventral CSF space without significant stenosis or change. Electronically Signed   By: San Morelle M.D.   On: 10/18/2021 14:52       Assessment & Plan:   Problem List Items Addressed This Visit     Back pain    Persistent pain as outlined.  Compression fracture as outlined.  Increase gabapentin.  Continue lidocaine patches.       Compression fracture of L1 vertebra ([facility])    S/p kyphoplasty.  F/u revealed a new T12 compression fracture.  Seeing ortho as outlined.  Continue lidocaine patches.  Increase gabapentin as outlined.  Follow.      Dysphagia    On protonix.  Still reports some dysphagia.   Discussed further evaluation, including swallowing evaluation and referral back to GI.  Declines.  Wants to follow.  Previously saw GI.  Zenker's diverticulum.  Wants to hold on further intervention. Discussed eating slowly.  Small bites.  Chew food well.       GERD (gastroesophageal reflux disease)    No acid reflux reported.  On protonix. No dysphagia reported.  Follow.       History of  breast cancer    Mammogram 01/21/21 - Birads I.  Scheduled for f/u mammogram 02/10/22       Hypercholesterolemia    Continue crestor.  Low cholesterol diet and exercise.  Follow lipid panel and liver function tests.        Hypertension    Continue amlodipine. Follow pressures.  Follow metabolic panel.       Hypothyroidism    On thyroid replacement.  Follow tsh.        Ovarian cyst    Noted on recent CT scan.  (2.2cm).  Compared to a previous scan and felt to be a benign process given slow growth.  Discussed with her regarding f/u.  Has seen gyn previously for f/u ovarian cyst. Arrange f/u.  Consider check CA 125.       Relevant Orders   Ambulatory referral to Gynecology   CA 125   Peripheral neuropathy    On gabapentin.  Discussed increasing dose to see if would help with her back pain.  Currently on 258m tid. Will gradually titrate up.   Follow. Discussed possible side effects.  Stress    Has good support.  Staying with daughter more now.  Follow.       Type 2 diabetes mellitus with diabetic polyneuropathy, without long-term current use of insulin ([place]) - Primary     Follow met b and a1c. Reviewed outside sugars.  Lab Results  Component Value Date   HGBA1C 6.5 11/13/2021         Other Visit Diagnoses     Need for immunization against influenza       Relevant Orders   Flu Vaccine QUAD High Dose(Fluad) (Completed)        Einar Pheasant, MD

## 2022-01-12 ENCOUNTER — Encounter: Payer: Self-pay | Admitting: Internal Medicine

## 2022-01-12 ENCOUNTER — Telehealth: Payer: Self-pay | Admitting: Internal Medicine

## 2022-01-12 NOTE — Assessment & Plan Note (Signed)
Continue crestor.  Low cholesterol diet and exercise. Follow lipid panel and liver function tests.   

## 2022-01-12 NOTE — Assessment & Plan Note (Signed)
On gabapentin.  Discussed increasing dose to see if would help with her back pain.  Currently on '200mg'$  tid. Will gradually titrate up.   Follow. Discussed possible side effects.

## 2022-01-12 NOTE — Assessment & Plan Note (Signed)
Mammogram 01/21/21 - Birads I.  Scheduled for f/u mammogram 02/10/22

## 2022-01-12 NOTE — Assessment & Plan Note (Signed)
Persistent pain as outlined.  Compression fracture as outlined.  Increase gabapentin.  Continue lidocaine patches.

## 2022-01-12 NOTE — Assessment & Plan Note (Signed)
Continue amlodipine.  Follow pressures.  Follow metabolic panel.   

## 2022-01-12 NOTE — Assessment & Plan Note (Signed)
On protonix.  Still reports some dysphagia.   Discussed further evaluation, including swallowing evaluation and referral back to GI.  Declines.  Wants to follow.  Previously saw GI.  Zenker's diverticulum.  Wants to hold on further intervention. Discussed eating slowly.  Small bites.  Chew food well.

## 2022-01-12 NOTE — Telephone Encounter (Signed)
Julie Hoover is scheduled for a mammogram in [place] on 02/10/22.  See if she would be agreeable to go by lab while there and have a CA 125 check prior to her visit with gyn.  I have placed order.  Non fasting lab.  Just would like for her to have drawn.

## 2022-01-12 NOTE — Assessment & Plan Note (Signed)
Follow met b and a1c. Reviewed outside sugars.  Lab Results  Component Value Date   HGBA1C 6.5 11/13/2021

## 2022-01-12 NOTE — Assessment & Plan Note (Signed)
On thyroid replacement.  Follow tsh.  

## 2022-01-12 NOTE — Assessment & Plan Note (Signed)
No acid reflux reported.  On protonix. No dysphagia reported.  Follow.

## 2022-01-12 NOTE — Assessment & Plan Note (Signed)
S/p kyphoplasty.  F/u revealed a new T12 compression fracture.  Seeing ortho as outlined.  Continue lidocaine patches.  Increase gabapentin as outlined.  Follow.

## 2022-01-12 NOTE — Assessment & Plan Note (Signed)
Has good support.  Staying with daughter more now.  Follow.

## 2022-01-12 NOTE — Assessment & Plan Note (Signed)
Noted on recent CT scan.  (2.2cm).  Compared to a previous scan and felt to be a benign process given slow growth.  Discussed with her regarding f/u.  Has seen gyn previously for f/u ovarian cyst. Arrange f/u.  Consider check CA 125.

## 2022-01-13 MED ORDER — GABAPENTIN 300 MG PO CAPS: 300.0000 mg | ORAL_CAPSULE | Freq: Three times a day (TID) | ORAL | 1 refills | Status: DC

## 2022-01-13 NOTE — Telephone Encounter (Signed)
Rx sent in to mail order pharmacy for gabapentin '300mg'$  tid #270 with one refill.

## 2022-01-13 NOTE — Telephone Encounter (Signed)
Pt returning call

## 2022-01-13 NOTE — Telephone Encounter (Signed)
LMTCB

## 2022-01-15 ENCOUNTER — Telehealth: Payer: Self-pay | Admitting: Internal Medicine

## 2022-01-15 NOTE — Telephone Encounter (Signed)
Notify her to stop her miacalcin nasal spray.  For her allergy symptoms, can add nasacort nasal spray - 2 sprays each nostril one time per day.  Do this in the evening.

## 2022-01-15 NOTE — Telephone Encounter (Signed)
Pt notified of what to purchase & she will let us know if any issues.

## 2022-01-15 NOTE — Telephone Encounter (Signed)
Patient called about her nose spray, calcitonin, salmon, (MIACALCIN) 200 UNIT/ACT nasal spray. She is having side effects; running nose, and numbness in one finger. She read that these can be side effects and does not want to keep on taking. Is there something else she could take?

## 2022-01-15 NOTE — Telephone Encounter (Signed)
I called patient to ask about numbness in her finger. She said that she has had issues with her pinky finger in left hand in the past & told that it was due to nerves. She holds a book a lot in that hand as well. She just read side effect of the MIACALCIN & thought that they may be related. She also said that her nose continues to run but she takes allergy pill & does not go outside much. She was wondering your thoughts & thought on alternative medication.

## 2022-01-16 NOTE — Telephone Encounter (Signed)
Pt daughter called stating she need clarification on the directions of pt medication

## 2022-01-29 DIAGNOSIS — Z872 Personal history of diseases of the skin and subcutaneous tissue: Secondary | ICD-10-CM | POA: Diagnosis not present

## 2022-01-29 DIAGNOSIS — Z859 Personal history of malignant neoplasm, unspecified: Secondary | ICD-10-CM | POA: Diagnosis not present

## 2022-01-29 DIAGNOSIS — D0362 Melanoma in situ of left upper limb, including shoulder: Secondary | ICD-10-CM | POA: Diagnosis not present

## 2022-01-29 DIAGNOSIS — L570 Actinic keratosis: Secondary | ICD-10-CM | POA: Diagnosis not present

## 2022-01-29 DIAGNOSIS — L578 Other skin changes due to chronic exposure to nonionizing radiation: Secondary | ICD-10-CM | POA: Diagnosis not present

## 2022-01-29 DIAGNOSIS — D485 Neoplasm of uncertain behavior of skin: Secondary | ICD-10-CM | POA: Diagnosis not present

## 2022-01-29 DIAGNOSIS — L821 Other seborrheic keratosis: Secondary | ICD-10-CM | POA: Diagnosis not present

## 2022-02-10 ENCOUNTER — Ambulatory Visit
Admission: RE | Admit: 2022-02-10 | Discharge: 2022-02-10 | Disposition: A | Discharge status: Home / Self Care | Payer: Medicare HMO | Source: Ambulatory Visit | Attending: Internal Medicine | Admitting: Internal Medicine

## 2022-02-10 ENCOUNTER — Other Ambulatory Visit
Admission: RE | Admit: 2022-02-10 | Discharge: 2022-02-10 | Disposition: A | Discharge status: Home / Self Care | Payer: Medicare HMO | Source: Home / Self Care | Attending: Internal Medicine | Admitting: Internal Medicine

## 2022-02-10 DIAGNOSIS — N83201 Unspecified ovarian cyst, right side: Secondary | ICD-10-CM | POA: Insufficient documentation

## 2022-02-10 DIAGNOSIS — R971 Elevated cancer antigen 125 [CA 125]: Secondary | ICD-10-CM | POA: Insufficient documentation

## 2022-02-10 DIAGNOSIS — Z1231 Encounter for screening mammogram for malignant neoplasm of breast: Secondary | ICD-10-CM | POA: Diagnosis not present

## 2022-02-11 LAB — CA 125: Cancer Antigen (CA) 125: 67.4 U/mL — ABNORMAL HIGH (ref 0.0–38.1)

## 2022-02-19 ENCOUNTER — Telehealth: Payer: Self-pay | Admitting: Internal Medicine

## 2022-02-19 NOTE — Telephone Encounter (Signed)
Ms Brix has an ovarian cyst.  I just received her CA 125 levels and they are elevated.  I have already placed an order for a referral to gyn Jefm Bryant).  When I talked to her about her labs, she informed me she wanted to see a female and request to see DR Leafy Ro.  Informed does not have appt.  Discussed would recommend going ahead and scheduling an appt given above.  She is agreeable. Do I need to place order for another referral.

## 2022-02-19 NOTE — Telephone Encounter (Signed)
I spoke with Julie Hoover yesterday and she is agreeable to go ahead and schedule an appt now.  Let me know if I need to do anything more.

## 2022-02-19 NOTE — Telephone Encounter (Signed)
Thank you very much 

## 2022-03-17 DIAGNOSIS — D0362 Melanoma in situ of left upper limb, including shoulder: Secondary | ICD-10-CM | POA: Diagnosis not present

## 2022-03-17 DIAGNOSIS — C4362 Malignant melanoma of left upper limb, including shoulder: Secondary | ICD-10-CM | POA: Diagnosis not present

## 2022-03-27 ENCOUNTER — Telehealth: Payer: Self-pay | Admitting: Internal Medicine

## 2022-03-27 ENCOUNTER — Telehealth: Payer: Self-pay

## 2022-03-27 ENCOUNTER — Other Ambulatory Visit: Payer: Self-pay

## 2022-03-27 DIAGNOSIS — E1142 Type 2 diabetes mellitus with diabetic polyneuropathy: Secondary | ICD-10-CM

## 2022-03-27 DIAGNOSIS — E039 Hypothyroidism, unspecified: Secondary | ICD-10-CM

## 2022-03-27 DIAGNOSIS — E7800 Pure hypercholesterolemia, unspecified: Secondary | ICD-10-CM

## 2022-03-27 DIAGNOSIS — I10 Essential (primary) hypertension: Secondary | ICD-10-CM

## 2022-03-27 NOTE — Telephone Encounter (Signed)
ORDERED

## 2022-03-27 NOTE — Telephone Encounter (Signed)
Patient has a lab appt 03/31/2022, there are no orders in.

## 2022-03-27 NOTE — Telephone Encounter (Signed)
Sent to dr Nicki Reaper for signature

## 2022-03-27 NOTE — Telephone Encounter (Signed)
This was marked to go to [facility].  Does she want the medication to go to [facility] or locally.

## 2022-03-27 NOTE — Telephone Encounter (Signed)
Patient states she needs a refill for her temazepam (RESTORIL) 30 MG capsule.  Patient states she has two left and she does not take them every night.  *Patient states she would like to have this prescription sent to [facility].

## 2022-03-28 MED ORDER — TEMAZEPAM 30 MG PO CAPS: 30.0000 mg | ORAL_CAPSULE | Freq: Every evening | ORAL | 0 refills | Status: DC | PRN

## 2022-03-28 NOTE — Telephone Encounter (Signed)
Confirmed with patient that she wants rx sent to [facility]

## 2022-03-31 ENCOUNTER — Other Ambulatory Visit (INDEPENDENT_AMBULATORY_CARE_PROVIDER_SITE_OTHER): Payer: Medicare HMO

## 2022-03-31 DIAGNOSIS — E7800 Pure hypercholesterolemia, unspecified: Secondary | ICD-10-CM

## 2022-03-31 DIAGNOSIS — E1142 Type 2 diabetes mellitus with diabetic polyneuropathy: Secondary | ICD-10-CM | POA: Diagnosis not present

## 2022-03-31 DIAGNOSIS — N83201 Unspecified ovarian cyst, right side: Secondary | ICD-10-CM | POA: Diagnosis not present

## 2022-03-31 DIAGNOSIS — E039 Hypothyroidism, unspecified: Secondary | ICD-10-CM | POA: Diagnosis not present

## 2022-03-31 DIAGNOSIS — I10 Essential (primary) hypertension: Secondary | ICD-10-CM

## 2022-03-31 LAB — LIPID PANEL
Cholesterol: 159 mg/dL (ref 0–200)
HDL: 73 mg/dL (ref >39.00)
LDL Cholesterol: 73 mg/dL (ref 0–99)
NonHDL: 86.08
Total CHOL/HDL Ratio: 2
Triglycerides: 63 mg/dL (ref 0.0–149.0)
VLDL: 12.6 mg/dL (ref 0.0–40.0)

## 2022-03-31 LAB — BASIC METABOLIC PANEL
BUN: 20 mg/dL (ref 6–23)
CO2: 29 mEq/L (ref 19–32)
Calcium: 10 mg/dL (ref 8.4–10.5)
Chloride: 103 mEq/L (ref 96–112)
Creatinine, Ser: 0.83 mg/dL (ref 0.40–1.20)
GFR: 62.04 mL/min (ref >60.00)
Glucose, Bld: 93 mg/dL (ref 70–99)
Potassium: 4.6 mEq/L (ref 3.5–5.1)
Sodium: 141 mEq/L (ref 135–145)

## 2022-03-31 LAB — HEPATIC FUNCTION PANEL
ALT: 9 U/L (ref 0–35)
AST: 13 U/L (ref 0–37)
Albumin: 4.3 g/dL (ref 3.5–5.2)
Alkaline Phosphatase: 54 U/L (ref 39–117)
Bilirubin, Direct: 0.1 mg/dL (ref 0.0–0.3)
Total Bilirubin: 0.7 mg/dL (ref 0.2–1.2)
Total Protein: 7.1 g/dL (ref 6.0–8.3)

## 2022-03-31 LAB — TSH: TSH: 1.56 u[IU]/mL (ref 0.35–5.50)

## 2022-03-31 LAB — HEMOGLOBIN A1C: Hgb A1c MFr Bld: 6.7 % — ABNORMAL HIGH (ref 4.6–6.5)

## 2022-04-07 ENCOUNTER — Other Ambulatory Visit: Payer: Medicare HMO

## 2022-04-08 DIAGNOSIS — H353132 Nonexudative age-related macular degeneration, bilateral, intermediate dry stage: Secondary | ICD-10-CM | POA: Diagnosis not present

## 2022-05-04 ENCOUNTER — Other Ambulatory Visit: Payer: Self-pay | Admitting: Internal Medicine

## 2022-05-04 DIAGNOSIS — N83201 Unspecified ovarian cyst, right side: Secondary | ICD-10-CM

## 2022-05-04 NOTE — Progress Notes (Signed)
Orders placed for labs

## 2022-05-12 ENCOUNTER — Encounter: Payer: Self-pay | Admitting: Internal Medicine

## 2022-05-12 ENCOUNTER — Ambulatory Visit (INDEPENDENT_AMBULATORY_CARE_PROVIDER_SITE_OTHER): Payer: Medicare HMO | Admitting: Internal Medicine

## 2022-05-12 VITALS — BP 130/72 | HR 62 | Temp 97.9°F | Resp 16 | Ht 64.0 in | Wt 130.0 lb

## 2022-05-12 DIAGNOSIS — E7800 Pure hypercholesterolemia, unspecified: Secondary | ICD-10-CM

## 2022-05-12 DIAGNOSIS — M545 Low back pain, unspecified: Secondary | ICD-10-CM

## 2022-05-12 DIAGNOSIS — S32010D Wedge compression fracture of first lumbar vertebra, subsequent encounter for fracture with routine healing: Secondary | ICD-10-CM | POA: Diagnosis not present

## 2022-05-12 DIAGNOSIS — E039 Hypothyroidism, unspecified: Secondary | ICD-10-CM | POA: Diagnosis not present

## 2022-05-12 DIAGNOSIS — N83201 Unspecified ovarian cyst, right side: Secondary | ICD-10-CM

## 2022-05-12 DIAGNOSIS — R1310 Dysphagia, unspecified: Secondary | ICD-10-CM

## 2022-05-12 DIAGNOSIS — I10 Essential (primary) hypertension: Secondary | ICD-10-CM | POA: Diagnosis not present

## 2022-05-12 DIAGNOSIS — Z853 Personal history of malignant neoplasm of breast: Secondary | ICD-10-CM | POA: Diagnosis not present

## 2022-05-12 DIAGNOSIS — K2100 Gastro-esophageal reflux disease with esophagitis, without bleeding: Secondary | ICD-10-CM | POA: Diagnosis not present

## 2022-05-12 DIAGNOSIS — G6289 Other specified polyneuropathies: Secondary | ICD-10-CM

## 2022-05-12 DIAGNOSIS — E1142 Type 2 diabetes mellitus with diabetic polyneuropathy: Secondary | ICD-10-CM

## 2022-05-12 LAB — BASIC METABOLIC PANEL
BUN: 20 mg/dL (ref 6–23)
CO2: 27 mEq/L (ref 19–32)
Calcium: 9.9 mg/dL (ref 8.4–10.5)
Chloride: 103 mEq/L (ref 96–112)
Creatinine, Ser: 0.79 mg/dL (ref 0.40–1.20)
GFR: 65.77 mL/min (ref >60.00)
Glucose, Bld: 80 mg/dL (ref 70–99)
Potassium: 4.3 mEq/L (ref 3.5–5.1)
Sodium: 139 mEq/L (ref 135–145)

## 2022-05-12 NOTE — Progress Notes (Signed)
Patient ID: Julie Hoover, female   DOB: 10-Aug-1929, 89 y.o.   MRN: [id number]   Subjective:    Patient ID: Julie Hoover, female    DOB: [birth year], 89 y.o.   MRN: [id number]  Patient here for  Chief Complaint  Patient presents with   Medical Management of Chronic Issues   .   HPI Here to follow up regarding her blood pressure, blood sugar and back pain.  She is accompanied by her daughter.  History obtained from both of them. Had L1 compression fracture.  S/p kyphoplasty.  Also T12 compression fracture.  Still with some pain.  Is better overall. Taking gabapentin.  Was questioning increasing dose. No chest pain.  Breathing stable.  No increased cough or congestion.  No abdominal pain.  Blood sugars doing well - 90-108.  No low sugars.   Seeing Dr Leafy Ro - f/u ovarian cyst. Has f/u appt today.  Needs labs drawn here for the gyn follow up.  Does not use her walker at home.  Discussed the need to use the walker.  Bowels ok.  Eating yogurt.     Past Medical History:  Diagnosis Date   Arthritis    Breast cancer ([place]) 1986   s/p right lumpectomy, XRT and Tamoxifen, mastectomy   Cancer ([place])    skin ca   Chicken pox    Colon polyps    Diabetes ([place])    Diverticulosis    Environmental allergies    GERD (gastroesophageal reflux disease)    Glaucoma    Hypercholesterolemia    Hyperglycemia    Hypertension    Hypothyroidism    Osteopenia    Peripheral neuropathy    Personal history of radiation therapy 1986   Urethral stricture    Past Surgical History:  Procedure Laterality Date   BREAST BIOPSY Right 6/86   lumpectomy-positive/rad   BREAST LUMPECTOMY  1986   right   ESOPHAGOGASTRODUODENOSCOPY (EGD) WITH PROPOFOL N/A 12/17/2015   Procedure: ESOPHAGOGASTRODUODENOSCOPY (EGD) WITH PROPOFOL;  Surgeon: Lollie Sails, MD;  Location: [facility] ENDOSCOPY;  Service: Endoscopy;  Laterality: N/A;   EYE SURGERY     Bilateral cataracts removed   IR KYPHO LUMBAR INC FX REDUCE  BONE BX UNI/BIL CANNULATION INC/IMAGING  09/26/2021   IR RADIOLOGIST EVAL & MGMT  10/17/2021   KNEE SURGERY  06/2008   torn meniscus, right   KNEE SURGERY  2012   torn menisucs, right   MASTECTOMY Right 1993   right, recurrence   TUBAL LIGATION     URETHRAL DILATION     Family History  Problem Relation Age of Onset   COPD Mother    Heart disease Father    Hypertension Father    Hypertension Sister    Hypercholesterolemia Sister    Diabetes Mellitus II Sister    Hypertension Brother        x2   Hypercholesterolemia Brother        x2   Cancer Son        Stomach   Cancer Other        mouth, aunt   Colon cancer Neg Hx    Breast cancer Neg Hx    Social History   Socioeconomic History   Marital status: Widowed    Spouse name: Not on file   Number of children: 2   Years of education: Not on file   Highest education level: Not on file  Occupational History   Not on file  Tobacco Use  Smoking status: Never   Smokeless tobacco: Never  Substance and Sexual Activity   Alcohol use: No    Alcohol/week: 0.0 standard drinks of alcohol   Drug use: No   Sexual activity: Never  Other Topics Concern   Not on file  Social History Narrative   Not on file   Social Determinants of Health   Financial Resource Strain: Low Risk  (11/28/2021)   Overall Financial Resource Strain (CARDIA)    Difficulty of Paying Living Expenses: Not hard at all  Food Insecurity: No Food Insecurity (11/28/2021)   Hunger Vital Sign    Worried About Running Out of Food in the Last Year: Never true    Ran Out of Food in the Last Year: Never true  Transportation Needs: No Transportation Needs (11/28/2021)   PRAPARE - Hydrologist (Medical): No    Lack of Transportation (Non-Medical): No  Physical Activity: Not on file  Stress: No Stress Concern Present (11/28/2021)   Orient    Feeling of Stress : Not at all   Social Connections: Unknown (11/28/2021)   Social Connection and Isolation Panel [NHANES]    Frequency of Communication with Friends and Family: More than three times a week    Frequency of Social Gatherings with Friends and Family: More than three times a week    Attends Religious Services: Not on file    Active Member of Clubs or Organizations: Not on file    Attends Archivist Meetings: Not on file    Marital Status: Widowed     Review of Systems  Constitutional:  Negative for appetite change and unexpected weight change.  HENT:  Negative for congestion and sinus pressure.   Respiratory:  Negative for cough and chest tightness.        Breathing stable.   Cardiovascular:  Negative for chest pain, palpitations and leg swelling.  Gastrointestinal:  Negative for abdominal pain, diarrhea, nausea and vomiting.  Genitourinary:  Negative for difficulty urinating and dysuria.  Musculoskeletal:  Positive for back pain. Negative for joint swelling and myalgias.  Skin:  Negative for color change and rash.  Neurological:  Negative for dizziness, light-headedness and headaches.  Psychiatric/Behavioral:  Negative for agitation and dysphoric mood.        Objective:     BP 130/72   Pulse 62   Temp 97.9 F (36.6 C)   Resp 16   Ht '5\' 4"'$  (1.626 m)   Wt 130 lb (59 kg)   SpO2 97%   BMI 22.31 kg/m  Wt Readings from Last 3 Encounters:  05/12/22 130 lb (59 kg)  01/06/22 124 lb 3.2 oz (56.3 kg)  11/28/21 122 lb (55.3 kg)    Physical Exam Vitals reviewed.  Constitutional:      General: She is not in acute distress.    Appearance: Normal appearance.  HENT:     Head: Normocephalic and atraumatic.     Right Ear: External ear normal.     Left Ear: External ear normal.  Eyes:     General: No scleral icterus.       Right eye: No discharge.        Left eye: No discharge.     Conjunctiva/sclera: Conjunctivae normal.  Neck:     Thyroid: No thyromegaly.  Cardiovascular:      Rate and Rhythm: Normal rate and regular rhythm.  Pulmonary:     Effort: No respiratory distress.  Breath sounds: Normal breath sounds. No wheezing.  Abdominal:     General: Bowel sounds are normal.     Palpations: Abdomen is soft.     Tenderness: There is no abdominal tenderness.  Musculoskeletal:        General: No swelling or tenderness.     Cervical back: Neck supple. No tenderness.  Lymphadenopathy:     Cervical: No cervical adenopathy.  Skin:    Findings: No erythema or rash.  Neurological:     Mental Status: She is alert.  Psychiatric:        Mood and Affect: Mood normal.        Behavior: Behavior normal.      Outpatient Encounter Medications as of 05/12/2022  Medication Sig   Accu-Chek Softclix Lancets lancets USE AS INSTRUCTED TO CHECK BLOOD SUGAR ONE TIME DAILY   amLODipine (NORVASC) 5 MG tablet TAKE 1 TABLET EVERY DAY   aspirin 81 MG tablet Take 81 mg by mouth daily.   Blood Glucose Monitoring Suppl (ACCU-CHEK AVIVA PLUS) w/Device KIT 1 Device by Does not apply route daily as needed.   Calcium Carbonate-Vitamin D 600-400 MG-UNIT chew tablet Chew 1 tablet by mouth 2 (two) times daily.   cetirizine (ZYRTEC) 10 MG tablet Take 10 mg by mouth daily.   dorzolamide-timolol (COSOPT) 22.3-6.8 MG/ML ophthalmic solution Place 1 drop into the left eye 2 (two) times daily.    gabapentin (NEURONTIN) 300 MG capsule Take 1 capsule (300 mg total) by mouth 3 (three) times daily.   latanoprost (XALATAN) 0.005 % ophthalmic solution Place 1 drop into the left eye at bedtime.   levothyroxine (SYNTHROID) 50 MCG tablet TAKE 1 TABLET EVERY DAY   Multiple Vitamins-Minerals (PRESERVISION AREDS 2) CHEW Chew 2 capsules by mouth.   rosuvastatin (CRESTOR) 20 MG tablet TAKE 1 TABLET EVERY DAY   temazepam (RESTORIL) 30 MG capsule Take 1 capsule (30 mg total) by mouth at bedtime as needed.   [DISCONTINUED] glucose blood (ACCU-CHEK AVIVA PLUS) test strip USE AS INSTRUCTED TO CHECK BLOOD SUGAR ONE  TIME DAILY   [DISCONTINUED] calcitonin, salmon, (MIACALCIN) 200 UNIT/ACT nasal spray Place 1 spray into alternate nostrils daily. (Patient not taking: Reported on 05/12/2022)   [DISCONTINUED] carbamide peroxide (DEBROX) 6.5 % OTIC solution 4-5 drops in both ears daily.  Massage for approximately 5 minutes daily. (Patient not taking: Reported on 05/12/2022)   [DISCONTINUED] Chromium-Cinnamon (CINNAMON PLUS CHROMIUM PO) Take by mouth. (Patient not taking: Reported on 05/12/2022)   [DISCONTINUED] omeprazole (PRILOSEC) 20 MG capsule TAKE 1 CAPSULE TWICE DAILY (Patient not taking: Reported on 05/12/2022)   No facility-administered encounter medications on file as of 05/12/2022.     Lab Results  Component Value Date   WBC 9.3 11/13/2021   HGB 12.7 11/13/2021   HCT 38.8 11/13/2021   PLT 251.0 11/13/2021   GLUCOSE 80 05/12/2022   CHOL 159 03/31/2022   TRIG 63.0 03/31/2022   HDL 73.00 03/31/2022   LDLCALC 73 03/31/2022   ALT 9 03/31/2022   AST 13 03/31/2022   NA 139 05/12/2022   K 4.3 05/12/2022   CL 103 05/12/2022   CREATININE 0.79 05/12/2022   BUN 20 05/12/2022   CO2 27 05/12/2022   TSH 1.56 03/31/2022   HGBA1C 6.7 (H) 03/31/2022   MICROALBUR <0.7 01/31/2021    MR THORACIC SPINE WO CONTRAST  Result Date: 10/18/2021 CLINICAL DATA:  Thoracic pain.  Recent kyphoplasty. EXAM: MRI THORACIC SPINE WITHOUT CONTRAST TECHNIQUE: Multiplanar, multisequence MR imaging of the thoracic spine was  performed. No intravenous contrast was administered. COMPARISON:  Lumbar spine radiographs 10/17/2021. MRI lumbar spine 08/22/2021. FINDINGS: Alignment: In no significant listhesis is present. Thoracic kyphosis is preserved. Vertebrae: A new inferior endplate fractures present at T12 anteriorly 30% loss of height is present. Edema at spreads to nearly the superior endplate. Interval vertebral augmentation noted at L1. Cement extrudes superiorly into the T12-L1 disc space. No other acute fractures are present.  Superior endplate Schmorl's nodes are present at T9 and T10. Marrow signal and vertebral body heights are otherwise normal. Cord:  Normal signal and morphology. Paraspinal and other soft tissues: Dependent airspace disease is present bilaterally, right greater than left. Visualized lung fields and mediastinum are otherwise unremarkable. Paraspinous musculature is within normal limits. Disc levels: C7-T1: Slight anterolisthesis is present. No significant stenosis is present. A shallow disc protrusion is present at T6-7 without significant stenosis. Right foraminal narrowing is secondary to facet disease at T9-10 and T10-11. Left foraminal narrowing is due to facet hypertrophy at T8-9 and T9-10. Slight retropulsion of bone at L1 extends into the ventral CSF space without significant stenosis or change. IMPRESSION: 1. Acute/subacute inferior endplate compression fracture at T12 with 30% loss of height. 2. Vertebral augmentation at L1. 3. No other acute fractures. 4. Right foraminal narrowing at T9-10 and T10-11 secondary to facet hypertrophy. 5. Left foraminal narrowing at T8-9 and T9-10 due to facet hypertrophy. 6. Slight retropulsion of bone at L1 extends into the ventral CSF space without significant stenosis or change. Electronically Signed   By: San Morelle M.D.   On: 10/18/2021 14:52       Assessment & Plan:   Problem List Items Addressed This Visit     Type 2 diabetes mellitus with diabetic polyneuropathy, without long-term current use of insulin ([place])     Follow met b and a1c. Reviewed outside sugars.  Lab Results  Component Value Date   HGBA1C 6.7 (H) 03/31/2022         Peripheral neuropathy    On gabapentin.  Continue current dose. Discussed possible side effects.  Hold on increasing dose.        Ovarian cyst    F/u with Dr Leafy Ro today.  Check CA 125 and human epididymis protein 4 today.       Hypothyroidism    On thyroid replacement.  Follow tsh.        Hypertension     Continue amlodipine. Follow pressures.  Follow metabolic panel.       Hypercholesterolemia    Continue crestor.  Low cholesterol diet and exercise.  Follow lipid panel and liver function tests.        History of breast cancer    02/10/22 - Birads I (left mammogram) s/p right mastectomy.       GERD (gastroesophageal reflux disease)    No acid reflux reported.  On protonix. Follow.       Dysphagia    On protonix.  Still reports some dysphagia.   Again discussed further evaluation, including swallowing evaluation and referral back to GI.  Again declines.  Wants to follow.  Previously saw GI.  Zenker's diverticulum.  Wants to hold on further intervention. Discussed eating slowly.  Small bites.  Chew food well.       Compression fracture of L1 vertebra ([facility])    S/p kyphoplasty.  F/u revealed a new T12 compression fracture.  Seeing ortho as outlined previously. Can continue lidocaine patches.  Continue gabapentin as outlined.  Follow.  Back pain - Primary    Persistent pain as outlined.  Compression fracture as outlined.  On gabapentin.  Appears to have helped.  Hold on increasing dose.  Can continue lidocaine patches.        Einar Pheasant, MD

## 2022-05-13 LAB — HUMAN EPIDIDYMIS PROTEIN 4: HE4: 174 pmol/L — ABNORMAL HIGH (ref 0.0–96.9)

## 2022-05-13 LAB — CA 125: CA 125: 48 U/mL — ABNORMAL HIGH (ref <35)

## 2022-05-14 ENCOUNTER — Other Ambulatory Visit: Payer: Self-pay | Admitting: Internal Medicine

## 2022-05-14 DIAGNOSIS — E1142 Type 2 diabetes mellitus with diabetic polyneuropathy: Secondary | ICD-10-CM

## 2022-05-18 ENCOUNTER — Encounter: Payer: Self-pay | Admitting: Internal Medicine

## 2022-05-18 NOTE — Assessment & Plan Note (Signed)
Continue amlodipine.  Follow pressures.  Follow metabolic panel.   

## 2022-05-18 NOTE — Assessment & Plan Note (Signed)
Continue crestor.  Low cholesterol diet and exercise. Follow lipid panel and liver function tests.   

## 2022-05-18 NOTE — Assessment & Plan Note (Signed)
S/p kyphoplasty.  F/u revealed a new T12 compression fracture.  Seeing ortho as outlined previously. Can continue lidocaine patches.  Continue gabapentin as outlined.  Follow.

## 2022-05-18 NOTE — Assessment & Plan Note (Signed)
On protonix.  Still reports some dysphagia.   Again discussed further evaluation, including swallowing evaluation and referral back to GI.  Again declines.  Wants to follow.  Previously saw GI.  Zenker's diverticulum.  Wants to hold on further intervention. Discussed eating slowly.  Small bites.  Chew food well.

## 2022-05-18 NOTE — Assessment & Plan Note (Signed)
On gabapentin.  Continue current dose. Discussed possible side effects.  Hold on increasing dose.

## 2022-05-18 NOTE — Assessment & Plan Note (Signed)
Follow met b and a1c. Reviewed outside sugars.  Lab Results  Component Value Date   HGBA1C 6.7 (H) 03/31/2022

## 2022-05-18 NOTE — Assessment & Plan Note (Signed)
Persistent pain as outlined.  Compression fracture as outlined.  On gabapentin.  Appears to have helped.  Hold on increasing dose.  Can continue lidocaine patches.

## 2022-05-18 NOTE — Assessment & Plan Note (Signed)
F/u with Dr Leafy Ro today.  Check CA 125 and human epididymis protein 4 today.

## 2022-05-18 NOTE — Assessment & Plan Note (Signed)
02/10/22 - Birads I (left mammogram) s/p right mastectomy.

## 2022-05-18 NOTE — Assessment & Plan Note (Signed)
No acid reflux reported.  On protonix. Follow.

## 2022-05-18 NOTE — Assessment & Plan Note (Signed)
On thyroid replacement.  Follow tsh.

## 2022-05-27 ENCOUNTER — Other Ambulatory Visit: Payer: Self-pay

## 2022-05-27 DIAGNOSIS — N83209 Unspecified ovarian cyst, unspecified side: Secondary | ICD-10-CM

## 2022-05-27 DIAGNOSIS — R971 Elevated cancer antigen 125 [CA 125]: Secondary | ICD-10-CM

## 2022-05-29 ENCOUNTER — Telehealth: Payer: Self-pay

## 2022-05-29 NOTE — Telephone Encounter (Signed)
Julie Hoover is unable to get scan prior to her visit with gyn oncology due to transportation. Scan scheduled following visit on 06/04/2022. Left voicemail with Ard[facility] [place] regarding scan details.

## 2022-06-03 ENCOUNTER — Other Ambulatory Visit: Payer: Medicare HMO

## 2022-06-04 ENCOUNTER — Ambulatory Visit
Admission: RE | Admit: 2022-06-04 | Discharge: 2022-06-04 | Disposition: A | Discharge status: Home / Self Care | Payer: Medicare HMO | Source: Ambulatory Visit | Attending: Obstetrics and Gynecology | Admitting: Obstetrics and Gynecology

## 2022-06-04 ENCOUNTER — Ambulatory Visit: Payer: Medicare HMO

## 2022-06-04 ENCOUNTER — Inpatient Hospital Stay: Payer: Medicare HMO | Attending: Obstetrics and Gynecology | Admitting: Obstetrics and Gynecology

## 2022-06-04 VITALS — BP 122/68 | HR 65 | Temp 97.0°F | Ht 64.0 in | Wt 130.0 lb

## 2022-06-04 DIAGNOSIS — R918 Other nonspecific abnormal finding of lung field: Secondary | ICD-10-CM | POA: Diagnosis not present

## 2022-06-04 DIAGNOSIS — R971 Elevated cancer antigen 125 [CA 125]: Secondary | ICD-10-CM | POA: Insufficient documentation

## 2022-06-04 DIAGNOSIS — M8580 Other specified disorders of bone density and structure, unspecified site: Secondary | ICD-10-CM | POA: Diagnosis not present

## 2022-06-04 DIAGNOSIS — Z79899 Other long term (current) drug therapy: Secondary | ICD-10-CM | POA: Diagnosis not present

## 2022-06-04 DIAGNOSIS — K2100 Gastro-esophageal reflux disease with esophagitis, without bleeding: Secondary | ICD-10-CM | POA: Diagnosis not present

## 2022-06-04 DIAGNOSIS — I10 Essential (primary) hypertension: Secondary | ICD-10-CM | POA: Diagnosis not present

## 2022-06-04 DIAGNOSIS — Z79890 Hormone replacement therapy (postmenopausal): Secondary | ICD-10-CM | POA: Diagnosis not present

## 2022-06-04 DIAGNOSIS — R9389 Abnormal findings on diagnostic imaging of other specified body structures: Secondary | ICD-10-CM | POA: Insufficient documentation

## 2022-06-04 DIAGNOSIS — Z9011 Acquired absence of right breast and nipple: Secondary | ICD-10-CM | POA: Diagnosis not present

## 2022-06-04 DIAGNOSIS — I2510 Atherosclerotic heart disease of native coronary artery without angina pectoris: Secondary | ICD-10-CM | POA: Diagnosis not present

## 2022-06-04 DIAGNOSIS — I700 Atherosclerosis of aorta: Secondary | ICD-10-CM | POA: Diagnosis not present

## 2022-06-04 DIAGNOSIS — E039 Hypothyroidism, unspecified: Secondary | ICD-10-CM | POA: Insufficient documentation

## 2022-06-04 DIAGNOSIS — Z7982 Long term (current) use of aspirin: Secondary | ICD-10-CM | POA: Insufficient documentation

## 2022-06-04 DIAGNOSIS — Z85820 Personal history of malignant melanoma of skin: Secondary | ICD-10-CM | POA: Diagnosis not present

## 2022-06-04 DIAGNOSIS — H409 Unspecified glaucoma: Secondary | ICD-10-CM | POA: Insufficient documentation

## 2022-06-04 DIAGNOSIS — K449 Diaphragmatic hernia without obstruction or gangrene: Secondary | ICD-10-CM | POA: Diagnosis not present

## 2022-06-04 DIAGNOSIS — K5900 Constipation, unspecified: Secondary | ICD-10-CM | POA: Insufficient documentation

## 2022-06-04 DIAGNOSIS — N83201 Unspecified ovarian cyst, right side: Secondary | ICD-10-CM

## 2022-06-04 DIAGNOSIS — N9489 Other specified conditions associated with female genital organs and menstrual cycle: Secondary | ICD-10-CM

## 2022-06-04 DIAGNOSIS — E1142 Type 2 diabetes mellitus with diabetic polyneuropathy: Secondary | ICD-10-CM | POA: Insufficient documentation

## 2022-06-04 DIAGNOSIS — K5730 Diverticulosis of large intestine without perforation or abscess without bleeding: Secondary | ICD-10-CM | POA: Diagnosis not present

## 2022-06-04 DIAGNOSIS — Z9851 Tubal ligation status: Secondary | ICD-10-CM | POA: Insufficient documentation

## 2022-06-04 DIAGNOSIS — Z853 Personal history of malignant neoplasm of breast: Secondary | ICD-10-CM | POA: Insufficient documentation

## 2022-06-04 DIAGNOSIS — E7800 Pure hypercholesterolemia, unspecified: Secondary | ICD-10-CM | POA: Insufficient documentation

## 2022-06-04 DIAGNOSIS — K6389 Other specified diseases of intestine: Secondary | ICD-10-CM | POA: Diagnosis not present

## 2022-06-04 DIAGNOSIS — E538 Deficiency of other specified B group vitamins: Secondary | ICD-10-CM | POA: Insufficient documentation

## 2022-06-04 DIAGNOSIS — D259 Leiomyoma of uterus, unspecified: Secondary | ICD-10-CM | POA: Diagnosis not present

## 2022-06-04 DIAGNOSIS — N83209 Unspecified ovarian cyst, unspecified side: Secondary | ICD-10-CM | POA: Insufficient documentation

## 2022-06-04 DIAGNOSIS — J479 Bronchiectasis, uncomplicated: Secondary | ICD-10-CM | POA: Diagnosis not present

## 2022-06-04 NOTE — Progress Notes (Signed)
Gynecologic Oncology Consult Visit   Referring Provider: Dr. Leafy Ro  Chief Complaint: right ovarian cyst  Subjective:  Julie Hoover is a 89 y.o. female who is seen in consultation from Dr. Leafy Ro for right ovarian cyst with elevated ca 125.   08/2021- CT Abdomen & Pelvis for Abdominal pain, acute (Ped 0-17y) generalized abdominal pain and black stools r/o mass  Reproductive: Right ovarian cyst measures 2.2 cm compared to 1.5 cm previously. This very slow growth likely reflects a benign process. Uterus and left adnexa unremarkable.   She was referred to gynecology.   11/23 CA125: 67.4   TAUS 05/12/22 Uterus- 4.76 x 1.67 x 2.48cm Uterus anteverted Endometrium=4.77m Rt ovary not seen Left ovary appears wnl Rt simple adnexal cyst=2.7cm No free fluid seen Fibroid seen: Rt posterior=1.2cm   Dr BLeafy Rorepeating tumor markers.   05/12/22 HE4  173 CA 125 48 Postmenopausal ROMA 52.58%  Imaging reviewed back to June 2013 which revealed resolution of previous right ovarian cyst.   Patient has history of breast cancer in 1993 s/p right mastectomy. Additionally, history of diabetes, esophagitis, melanoma, tubal ligation, glaucoma, urethral dilation.   She is clinically asymptomatic of mass. No pelvic pain, bleeding or discharge. No changes in bowel movements. She feels well and denies complaints.    Problem List: Patient Active Problem List   Diagnosis Date Noted   Ovarian cyst 09/09/2021   Abnormal CT of the abdomen 09/09/2021   Back pain 09/05/2021   Compression fracture of L1 vertebra (HCC) 07/16/2021   Right knee pain 02/17/2021   Need for shingles vaccine 06/02/2020   Dysphagia 01/15/2020   Constipation 04/27/2019   Cerumen impaction 07/11/2018   Dizziness 07/11/2018   Stress 06/27/2017   B12 deficiency 02/28/2017   Sleep disturbance 10/25/2016   GERD (gastroesophageal reflux disease) 12/19/2015   Numbness of left hand 10/01/2015   Environmental allergies  10/01/2015   Left shoulder pain 06/11/2015   Urethral syndrome 12/20/2014   Atrophic vaginitis 12/20/2014   Urethral caruncle 12/20/2014   Health care maintenance 09/27/2014   Toe pain 05/29/2014   Glaucoma 09/25/2013   H/O urethral stricture 05/19/2013   Osteopenia 06/27/2012   Peripheral neuropathy 03/20/2012   Type 2 diabetes mellitus with diabetic polyneuropathy, without long-term current use of insulin ([place] 03/19/2012   Hypothyroidism 03/19/2012   History of breast cancer 03/19/2012   Hypercholesterolemia 03/19/2012   Hypertension 03/19/2012    Past Medical History: Past Medical History:  Diagnosis Date   Arthritis    [place]   s/p right lumpectomy, XRT and Tamoxifen, mastectomy   Cancer ([place]    skin ca   Chicken pox    Colon polyps    Diabetes ([place]    Diverticulosis    Environmental allergies    GERD (gastroesophageal reflux disease)    Glaucoma    Hypercholesterolemia    Hyperglycemia    Hypertension    Hypothyroidism    Osteopenia    Peripheral neuropathy    Personal history of radiation therapy 1986   Urethral stricture     Past Surgical History: Past Surgical History:  Procedure Laterality Date   BREAST BIOPSY Right 6/86   lumpectomy-positive/rad   BREAST LUMPECTOMY  1986   right   ESOPHAGOGASTRODUODENOSCOPY (EGD) WITH PROPOFOL N/A 12/17/2015   Procedure: ESOPHAGOGASTRODUODENOSCOPY (EGD) WITH PROPOFOL;  Surgeon: MLollie Sails MD;  [facility];  Service: Endoscopy;  Laterality: N/A;   EYE SURGERY     Bilateral cataracts removed   IR  KYPHO LUMBAR INC FX REDUCE BONE BX UNI/BIL CANNULATION INC/IMAGING  09/26/2021   IR RADIOLOGIST EVAL & MGMT  10/17/2021   KNEE SURGERY  06/2008   torn meniscus, right   KNEE SURGERY  2012   torn menisucs, right   MASTECTOMY Right 1993   right, recurrence   TUBAL LIGATION     URETHRAL DILATION      Past Gynecologic History:  Last pap: unknown; no h/o abnormal Paps   OB History:   G2P2. Her son passed away at age 63. Her daughter, Julie Hoover, is living and actively participates in patient's care.  OB History  No obstetric history on file.    Family History: Family History  Problem Relation Age of Onset   COPD Mother    Heart disease Father    Hypertension Father    Hypertension Sister    Hypercholesterolemia Sister    Diabetes Mellitus II Sister    Hypertension Brother        x2   Hypercholesterolemia Brother        x2   Cancer Son        Stomach   Cancer Other        mouth, aunt   Colon cancer Neg Hx    Breast cancer Neg Hx     Social History: Social History   Socioeconomic History   Marital status: Widowed    Spouse name: Not on file   Number of children: 2   Years of education: Not on file   Highest education level: Not on file  Occupational History   Not on file  Tobacco Use   Smoking status: Never   Smokeless tobacco: Never  Substance and Sexual Activity   Alcohol use: No    Alcohol/week: 0.0 standard drinks of alcohol   Drug use: No   Sexual activity: Never  Other Topics Concern   Not on file  Social History Narrative   Not on file   Social Determinants of Health   Financial Resource Strain: Low Risk  (11/28/2021)   Overall Financial Resource Strain (CARDIA)    Difficulty of Paying Living Expenses: Not hard at all  Food Insecurity: No Food Insecurity (11/28/2021)   Hunger Vital Sign    Worried About Running Out of Food in the Last Year: Never true    Ran Out of Food in the Last Year: Never true  Transportation Needs: No Transportation Needs (11/28/2021)   PRAPARE - Hydrologist (Medical): No    Lack of Transportation (Non-Medical): No  Physical Activity: Not on file  Stress: No Stress Concern Present (11/28/2021)   [place]    Feeling of Stress : Not at all  Social Connections: Unknown (11/28/2021)   Social Connection and Isolation  Panel [NHANES]    Frequency of Communication with Friends and Family: More than three times a week    Frequency of Social Gatherings with Friends and Family: More than three times a week    Attends Religious Services: Not on file    Active Member of Clubs or Organizations: Not on file    Attends Archivist Meetings: Not on file    Marital Status: Widowed  Intimate Partner Violence: Not At Risk (11/28/2021)   Humiliation, Afraid, Rape, and Kick questionnaire    Fear of Current or Ex-Partner: No    Emotionally Abused: No    Physically Abused: No    Sexually Abused: No  Allergies: Allergies  Allergen Reactions   Iodinated Contrast Media     Other reaction(s): Blood Disorder Uncoded Allergy. Allergen: ANESTHESIA ENDING IN THANE   Ciprofloxacin Other (See Comments)    Intolerance when taking large doses   Gentian Root     Blisters    Halothane Other (See Comments)    Hepatitis and elevated liver enzymes   Hydrocodone Itching   Norco [Hydrocodone-Acetaminophen] Itching   Sulfa Antibiotics Rash    Current Medications: Current Outpatient Medications  Medication Sig Dispense Refill   ACCU-CHEK GUIDE test strip USE AS INSTRUCTED 100 strip 3   Accu-Chek Softclix Lancets lancets USE AS INSTRUCTED TO CHECK BLOOD SUGAR ONE TIME DAILY 100 each 3   amLODipine (NORVASC) 5 MG tablet TAKE 1 TABLET EVERY DAY 90 tablet 3   aspirin 81 MG tablet Take 81 mg by mouth daily.     Blood Glucose Monitoring Suppl (ACCU-CHEK AVIVA PLUS) w/Device KIT 1 Device by Does not apply route daily as needed. 1 kit 0   Calcium Carbonate-Vitamin D 600-400 MG-UNIT chew tablet Chew 1 tablet by mouth 2 (two) times daily.     cetirizine (ZYRTEC) 10 MG tablet Take 10 mg by mouth daily.     dorzolamide-timolol (COSOPT) 22.3-6.8 MG/ML ophthalmic solution Place 1 drop into the left eye 2 (two) times daily.      gabapentin (NEURONTIN) 300 MG capsule Take 1 capsule (300 mg total) by mouth 3 (three) times daily.  270 capsule 1   latanoprost (XALATAN) 0.005 % ophthalmic solution Place 1 drop into the left eye at bedtime.  5   levothyroxine (SYNTHROID) 50 MCG tablet TAKE 1 TABLET EVERY DAY 90 tablet 1   Multiple Vitamins-Minerals (PRESERVISION AREDS 2) CHEW Chew 2 capsules by mouth.     rosuvastatin (CRESTOR) 20 MG tablet TAKE 1 TABLET EVERY DAY 90 tablet 3   temazepam (RESTORIL) 30 MG capsule Take 1 capsule (30 mg total) by mouth at bedtime as needed. 30 capsule 0   omeprazole (PRILOSEC) 20 MG capsule Take 20 mg by mouth daily. (Patient not taking: Reported on 06/04/2022)     No current facility-administered medications for this visit.    General: negative for fevers, changes in weight or night sweats Skin: negative for changes in moles or sores or rash Eyes: negative for changes in vision HEENT: negative for change in hearing, tinnitus, voice changes Pulmonary: negative for dyspnea, orthopnea, productive cough, wheezing Cardiac: negative for palpitations, pain Gastrointestinal: negative for nausea, vomiting, constipation, diarrhea, hematemesis, hematochezia Genitourinary/Sexual: negative for dysuria, retention, hematuria, incontinence Ob/Gyn:  negative for abnormal bleeding, or pain Musculoskeletal: negative for pain, joint pain, back pain Hematology: negative for easy bruising, abnormal bleeding Neurologic/Psych: negative for headaches, seizures, paralysis, weakness, numbness  Objective:  Physical Examination:  BP 122/68 (BP Location: Left Arm, Patient Position: Sitting)   Pulse 65   Temp (!) 97 F (36.1 C) (Tympanic)   Ht 5' 4"$  (1.626 m)   Wt 130 lb (59 kg)   SpO2 96%   BMI 22.31 kg/m     ECOG Performance Status: 2 - Symptomatic, <50% confined to bed  GENERAL: Patient is an elderly appearing female in no acute distress. She is able to ambulate independently short distances. Needed assistance to walk; somewhat feeble and required arm assistance for guidance.  HEENT:  Sclera clear.  Anicteric NODES:  Negative axillary, supraclavicular, inguinal lymph node survery LUNGS:  Clear to auscultation bilaterally.   HEART:  Regular rate and rhythm.  ABDOMEN:  Soft, nontender.  No hernias. No masses or ascites. No organomegaly. EXTREMITIES:  No peripheral edema. Atraumatic. No cyanosis SKIN:  Clear with no obvious rashes or skin changes.  NEURO:  Nonfocal. Well oriented.  Appropriate affect.   Pelvic: Exam chaperoned by CMA EGBUS: no lesions Urethra: erythematous meatus Cervix: no lesions, nontender, mobile Vagina: no lesions, no discharge or bleeding Uterus: small size, nontender, mobile Adnexa: no palpable masses; tender on the right with deep palpation Rectovaginal: confirmatory. A lot of stool in the vaulty  Lab Review Labs on site today: As noted in history of present illness  Radiologic Imaging: CT scan ordered today    Assessment:  Shaquitta Dimos is a 89 y.o. female diagnosed with incidental asymptomatic ovarian cystic mass with elevated tumor markers.  Constipation with prior history of hematochezia  Performance status of 2  Medical co-morbidities complicating care: HTN, h/o breast cancer and prior radiation Plan:   Problem List Items Addressed This Visit   None Visit Diagnoses     Adnexal mass    -  Primary       We discussed options for management including observation versus surgical management.  She is not interested in surgery.  She has a CT scheduled for today and she will return to clinic for discussion of these results and management recommendations.   Suggested return to clinic in  2-3 weeks.    The patient's diagnosis, an outline of the further diagnostic and laboratory studies which will be required, the recommendation for surgery, and alternatives were discussed with her and her accompanying family members.  All questions were answered to their satisfaction.  Beckey Rutter, NP  A total of 50  minutes were spent with the  patient/family today; >50% was spent in education, counseling and coordination of care for incidental ovarian cystic mass with elevated tumor markers.  I personally had a face to face interaction and evaluated the patient jointly with the NP, Ms. Beckey Rutter.  I have reviewed her history and available records and have performed the key portions of the physical exam including general, abdominal exam, pelvic exam with my findings confirming those documented above by the APP.  I have discussed the case with the APP and the patient.  I agree with the above documentation, assessment and plan which was fully formulated by me.  Counseling was completed by me.   I personally saw the patient and performed a substantive portion of this encounter in conjunction with the listed APP as documented above.  Angeles Gaetana Michaelis, MD     CC:  Dr. Leafy Ro

## 2022-06-10 IMAGING — MR MR LUMBAR SPINE W/O CM
5 series · 31 of 48 positions shown · non-contrast
Comparison: None

CLINICAL DATA: Lumbar radiculopathy. Low back pain

EXAM:
MRI LUMBAR SPINE WITHOUT CONTRAST
TECHNIQUE: Multiplanar, multisequence MR imaging of the lumbar spine was
performed. No intravenous contrast was administered.

[Series 5: T2 · sagittal · 4.0mm · 0.81mm/px · 6 of 15 slices shown (1 of 2)]
[im 1/15]
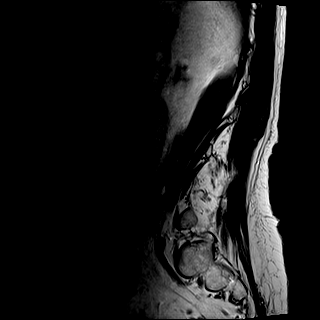
[im 3/15]
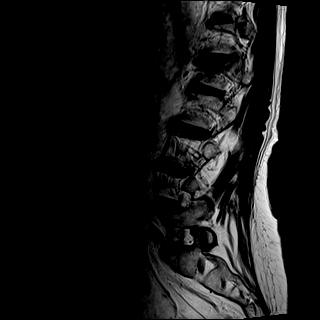
[im 6/15]
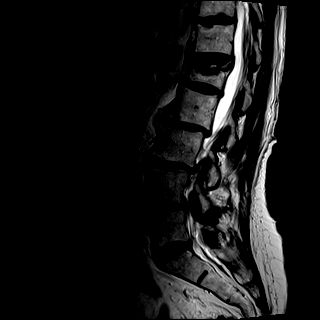
[im 9/15]
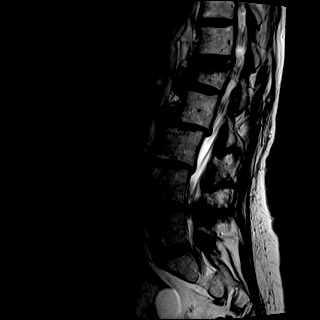
[im 12/15]
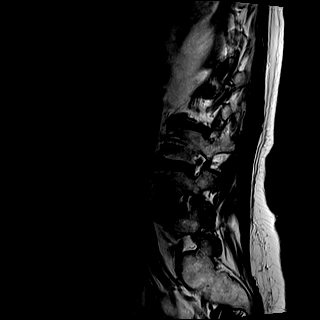
[im 15/15]
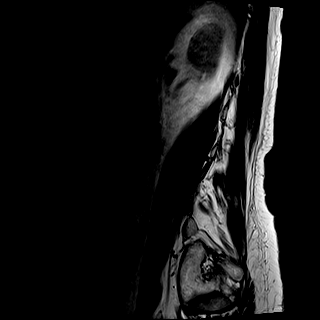

[Series 6: T1 · sagittal · 4.0mm · 0.81mm/px · 7 of 15 slices shown (1 of 2)]
[im 1/15]
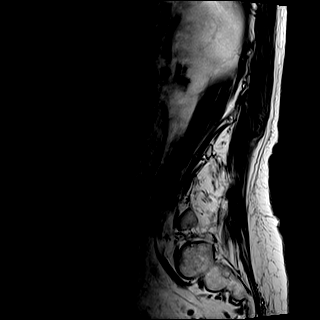
[im 3/15]
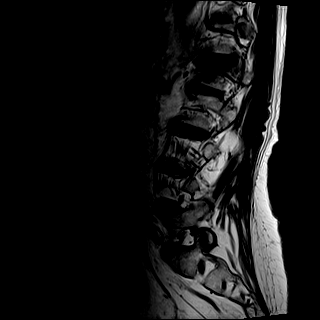
[im 5/15]
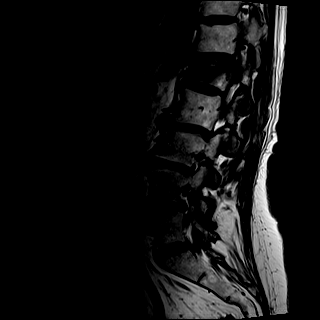
[im 8/15]
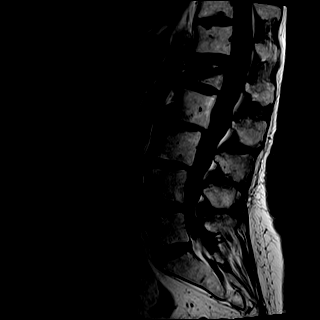
[im 10/15]
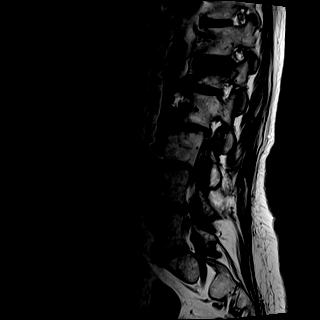
[im 12/15]
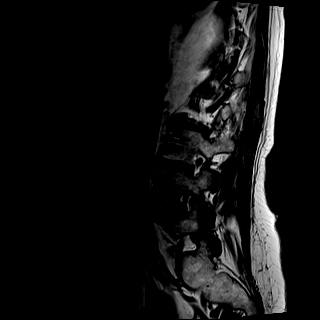
[im 15/15]
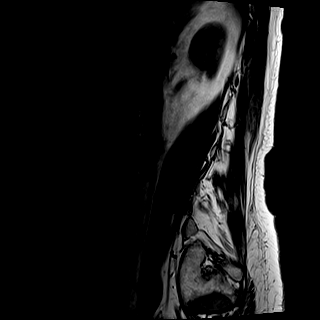

[Series 7: STIR · sagittal · 4.0mm · 0.41mm/px · 2 of 15 slices shown]
[im 1/15]
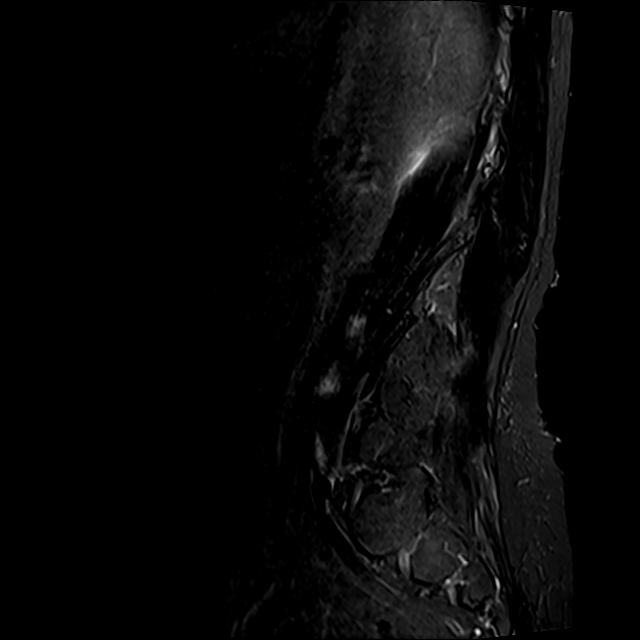
[im 3/15]
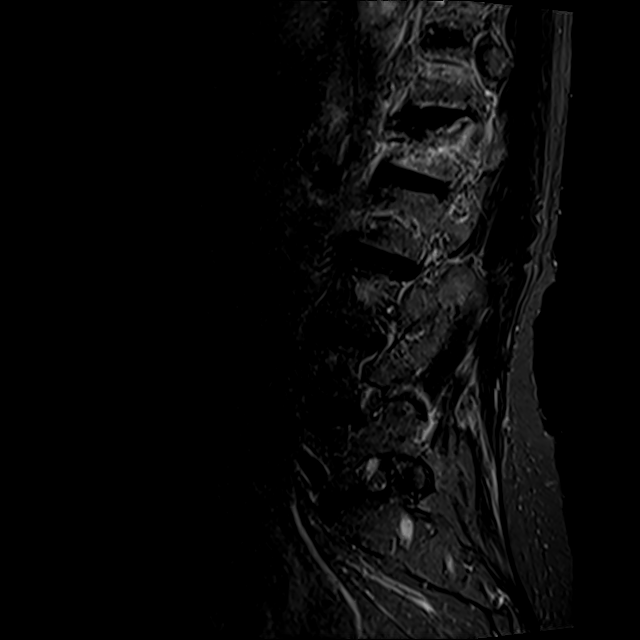

[Series 8: T2 · axial · 4.0mm · 0.78mm/px · z∈[-113,+84]mm · 8 of 31 slices shown (2 of 2)]
[im 1/31]
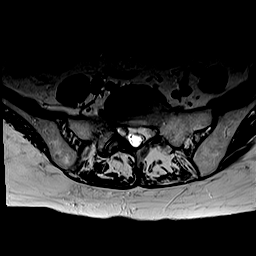
[im 5/31]
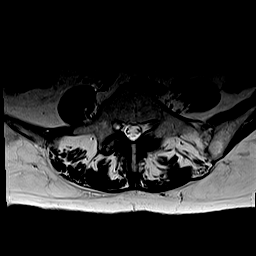
[im 10/31]
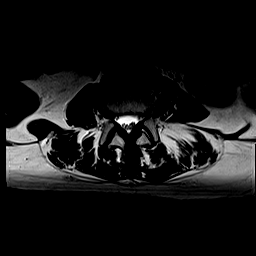
[im 14/31]
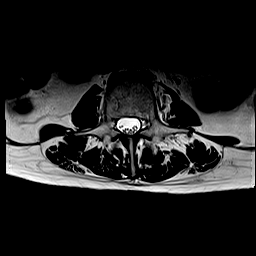
[im 17/31]
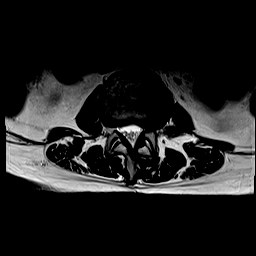
[im 21/31]
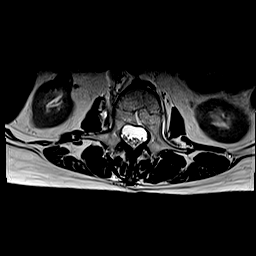
[im 26/31]
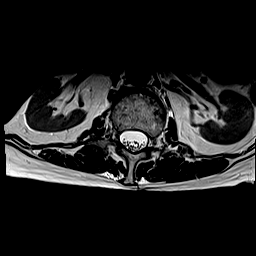
[im 31/31]
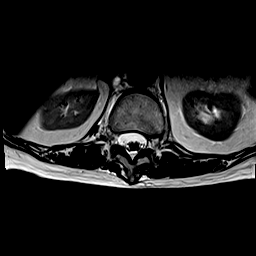

[Series 9: T1 · axial · 4.0mm · 0.39mm/px · z∈[-113,+84]mm · 8 of 31 slices shown (2 of 2)]
[im 1/31]
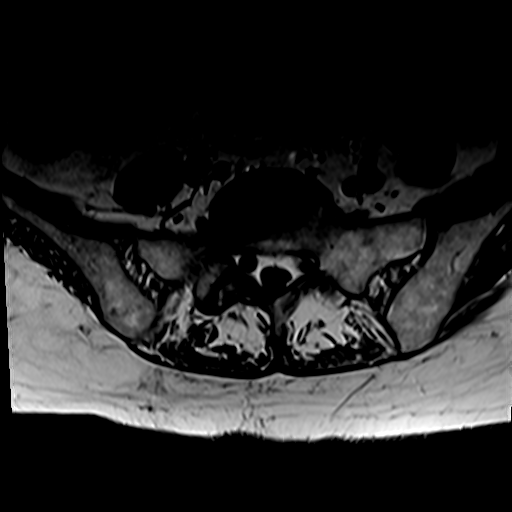
[im 5/31]
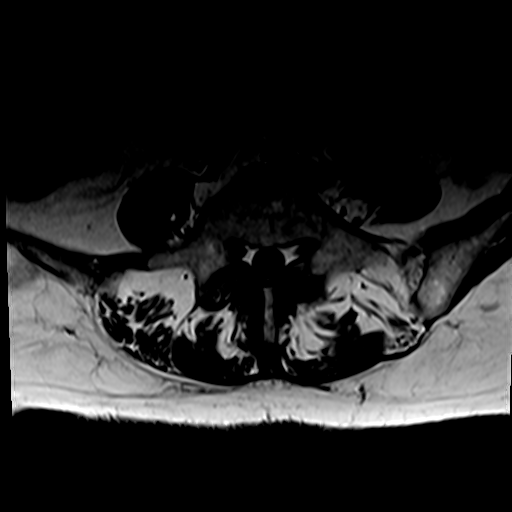
[im 10/31]
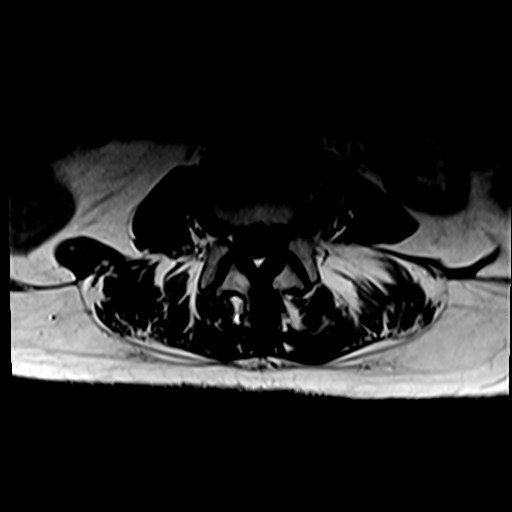
[im 14/31]
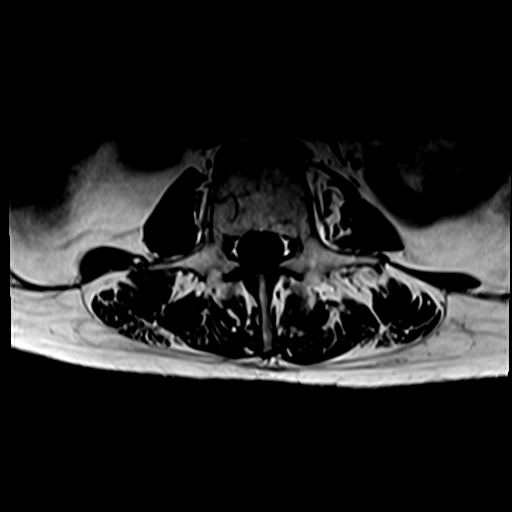
[im 17/31]
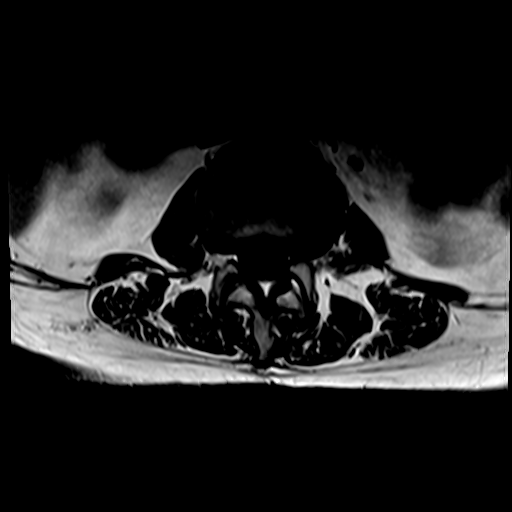
[im 21/31]
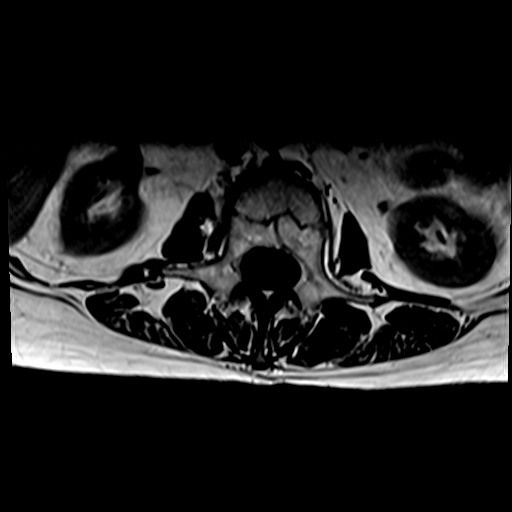
[im 26/31]
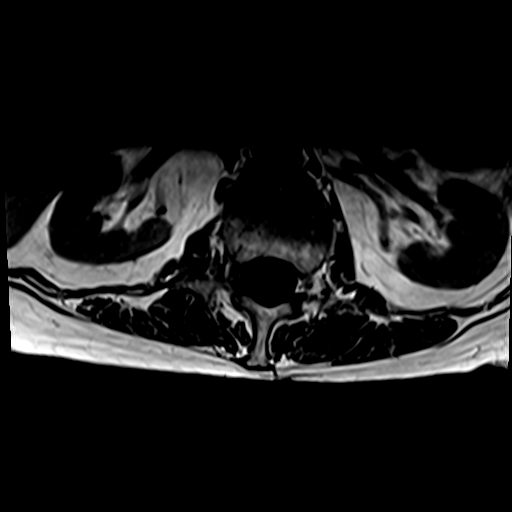
[im 31/31]
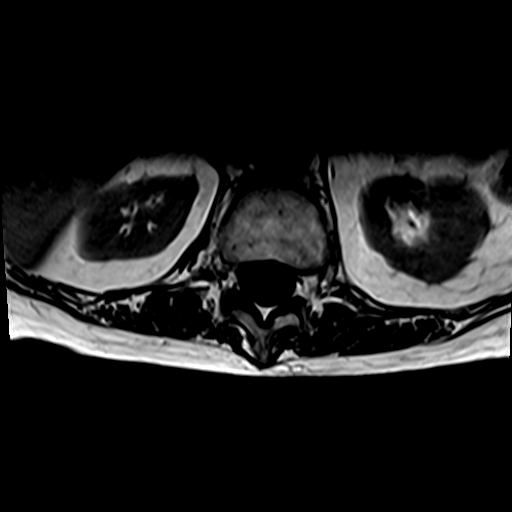

[31 of 48 positions shown; findings below may reference images not displayed]

FINDINGS: Segmentation:  Standard.

Alignment: Mild retrolisthesis at L2-L3 and L3-L4. Minimal superior
endplate retropulsion at L1.

Vertebrae: L1 compression fracture causing approximately 50% loss of
height at the superior endplate. There is underlying marrow edema.
Minor degenerative endplate marrow edema at L2-L3. No suspicious
osseous lesion.

Conus medullaris and cauda equina: Conus extends to the L1 level.
Conus and cauda equina appear normal.

Paraspinal and other soft tissues: Left ovarian cyst. Refer to same
day CT abdomen and pelvis.

Disc levels:

L1-L2:  Minimal disc bulge.  No canal or foraminal stenosis.

L2-L3: Retrolisthesis and disc bulge. No canal or foraminal
stenosis.

L3-L4: Retrolisthesis and disc bulge eccentric to the left. Facet
arthropathy with ligamentum flavum infolding. Minor canal stenosis.
Narrowing of the left greater than right subarticular recesses. No
right foraminal stenosis. Mild left foraminal stenosis.

L4-L5: Disc bulge. Facet arthropathy with ligamentum flavum
infolding. Marked canal stenosis with effacement of subarticular
recesses. Mild foraminal stenosis.

L5-S1: Small left foraminal protrusion. Facet arthropathy. No canal
or foraminal stenosis.
IMPRESSION: Acute L1 compression fracture with approximately 50% loss of height.
Minimal endplate retropulsion.

Multilevel degenerative changes as detailed above. There is marked
canal stenosis at L4-L5 with effacement of subarticular recesses.

## 2022-06-10 IMAGING — CT CT ABD-PELV W/O CM
2 of 4 series · 16 of 46 positions shown, 18 images · non-contrast
Comparison: 08/06/2009

CLINICAL DATA: Abdominal pain, acute (Ped 0-17y) genearlized ab
pain black stools r/o mass



[Series 2: routine abd/pel wo · axial · 0.71mm/px · z∈[-1246,-861]mm · 13 of 85 slices shown, 15 images]
[im 4/85  soft-tissue]
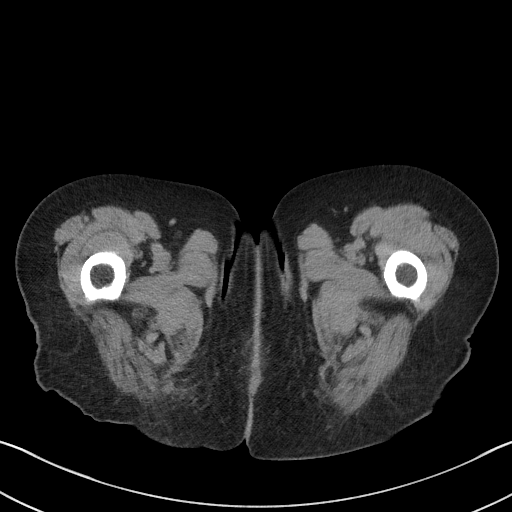
[im 4/85  bone]
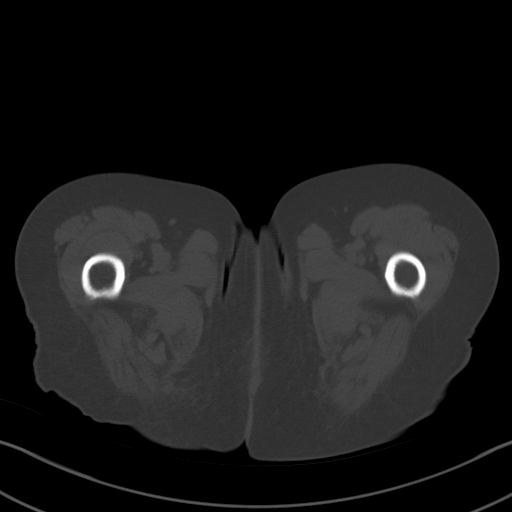
[im 11/85  soft-tissue]
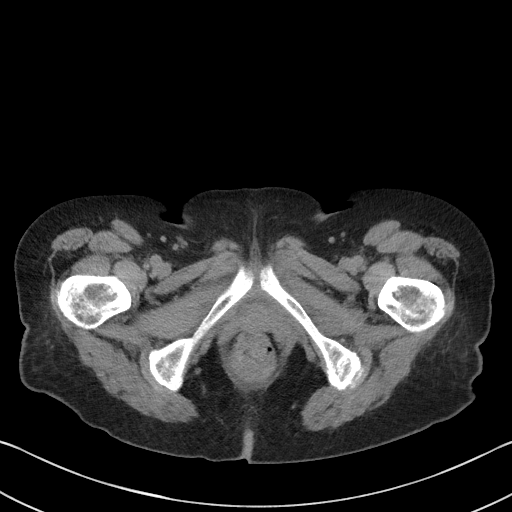
[im 18/85  soft-tissue]
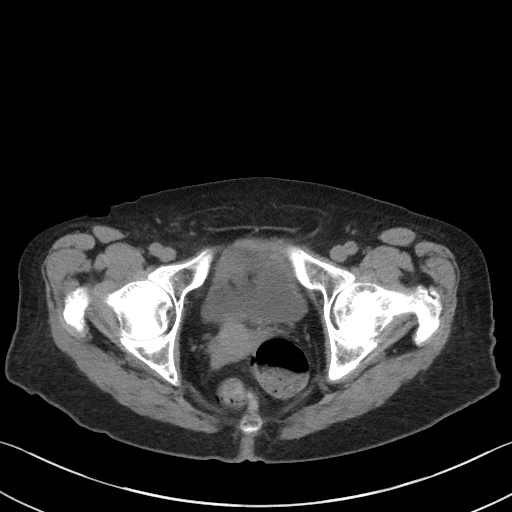
[im 25/85  soft-tissue]
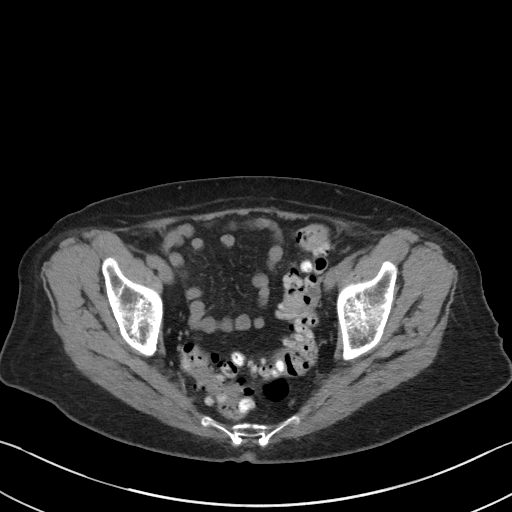
[im 29/85  soft-tissue]
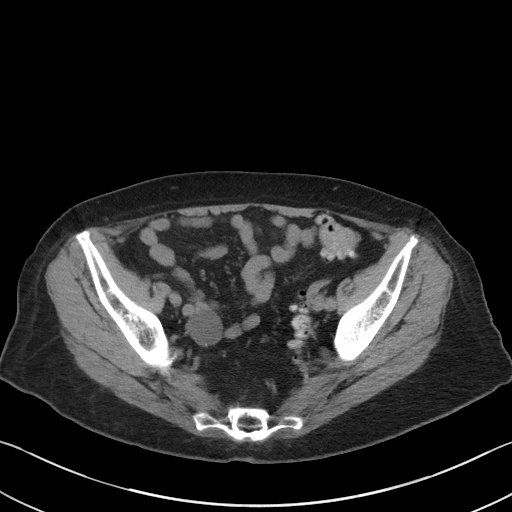
[im 36/85  soft-tissue]
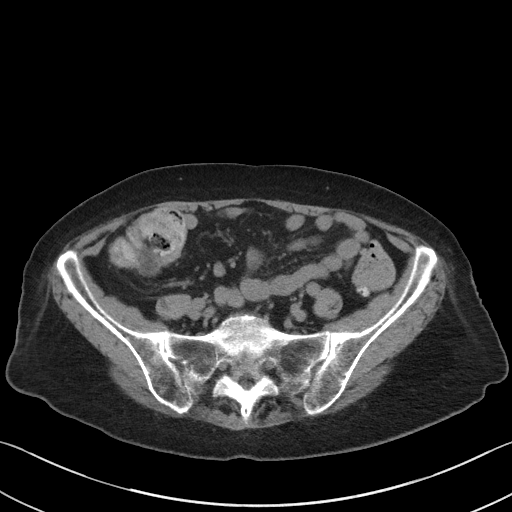
[im 43/85  soft-tissue]
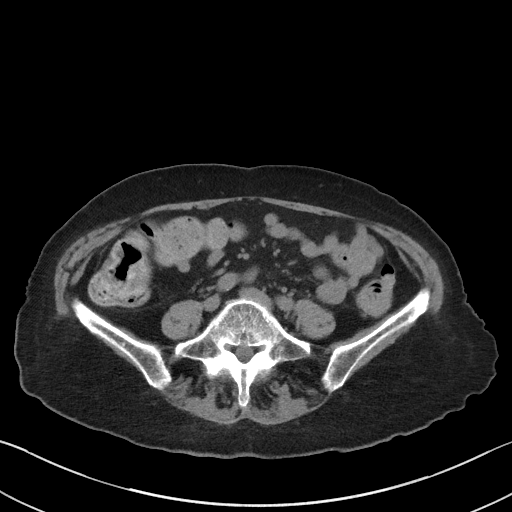
[im 50/85  soft-tissue]
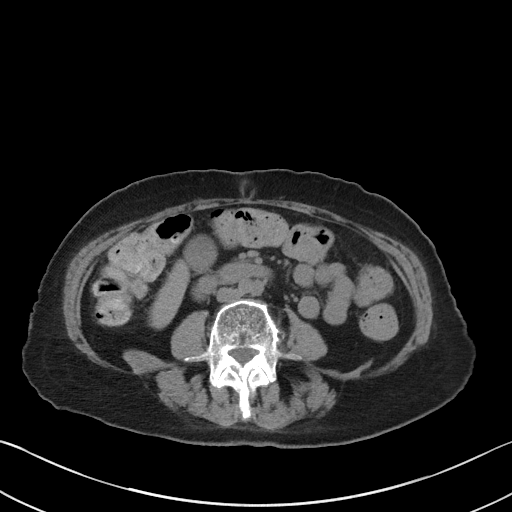
[im 57/85  soft-tissue]
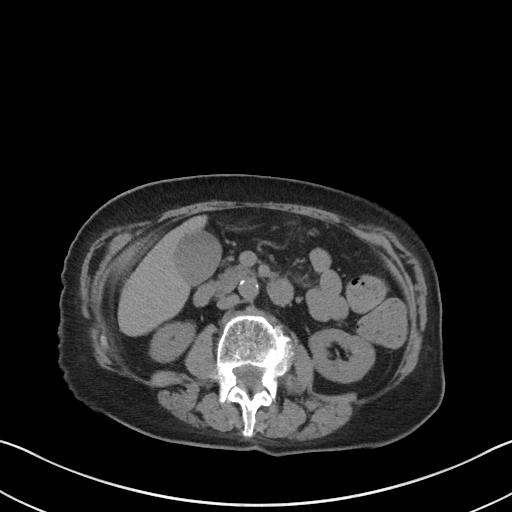
[im 57/85  bone]
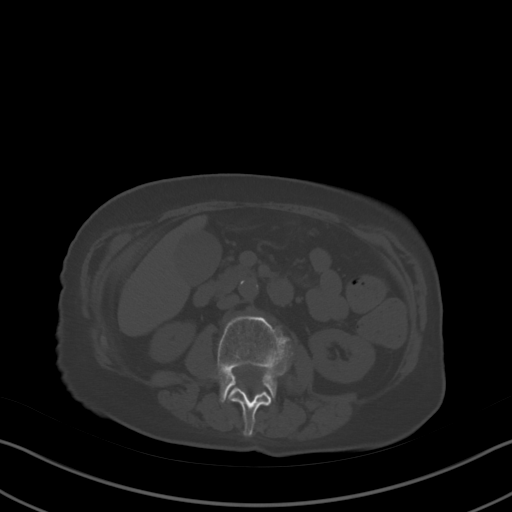
[im 60/85  soft-tissue]
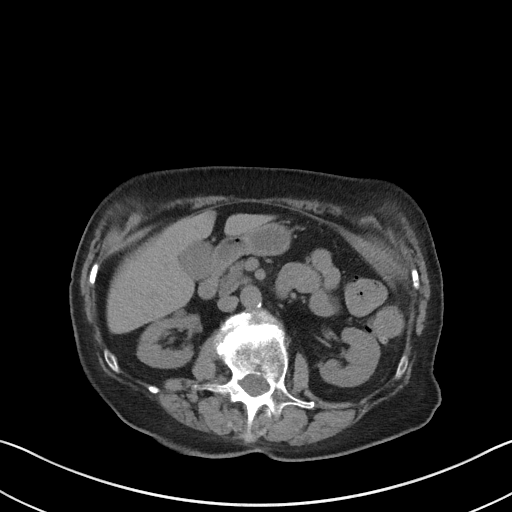
[im 67/85  soft-tissue]
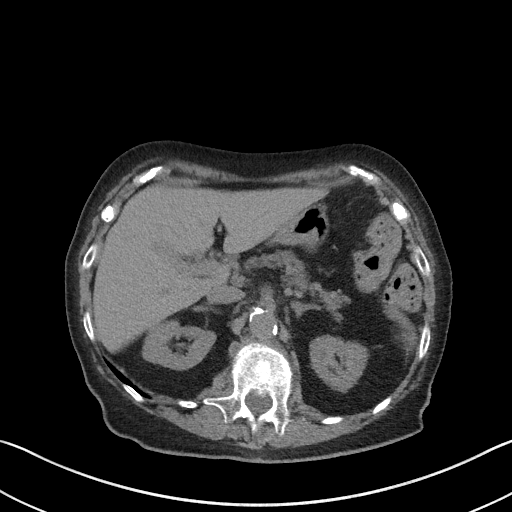
[im 74/85  soft-tissue]
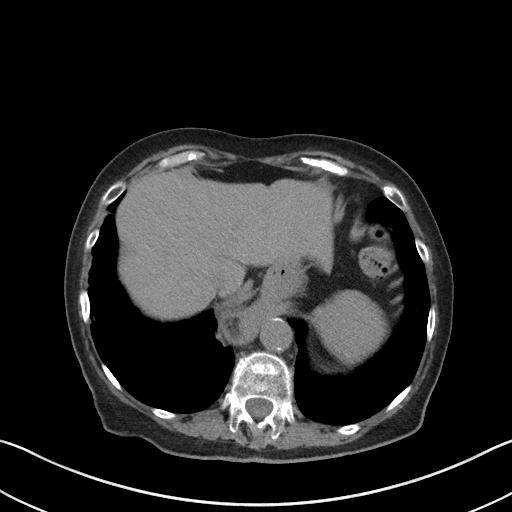
[im 81/85  soft-tissue]
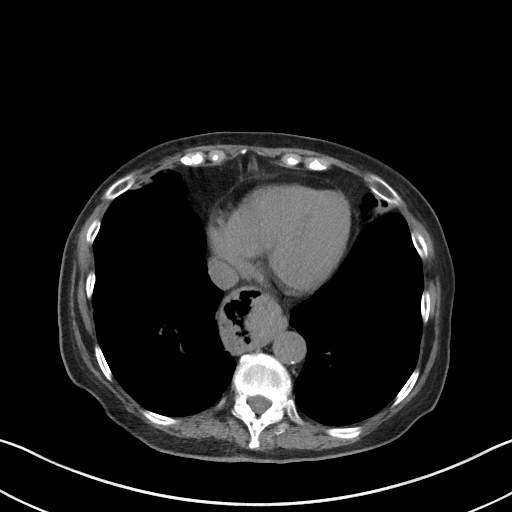

[Series 4: coronal st · coronal · 0.72mm/px · 3 of 81 slices shown]
[im 36/81  soft-tissue]
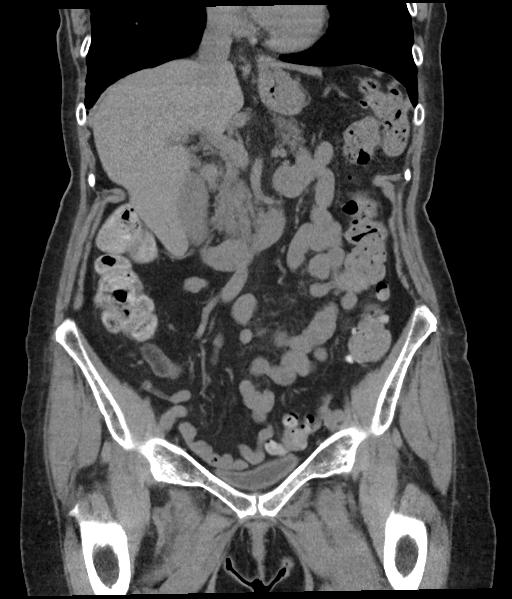
[im 45/81  soft-tissue]
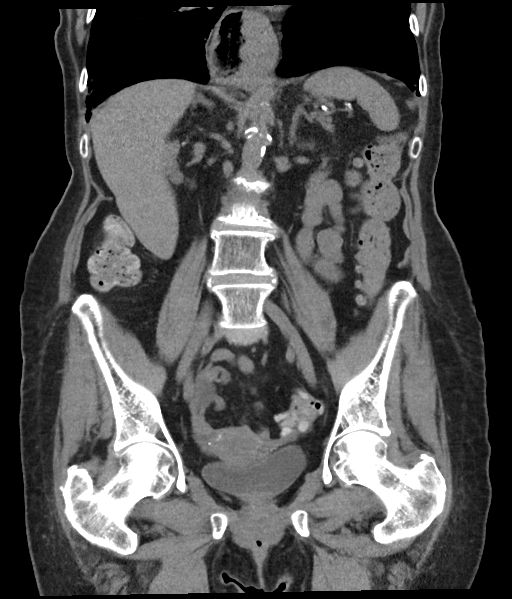
[im 54/81  soft-tissue]
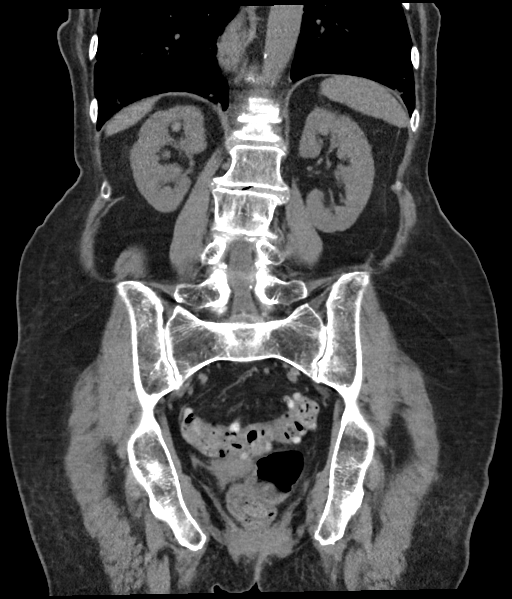

[16 of 46 positions shown; findings below may reference images not displayed]

FINDINGS: Lower chest: Scarring noted in the lung bases. There are tree-in-bud
nodular densities in the right middle lobe, lingula and both lower
lobes, likely related to scarring or indolent infection.
Moderate-sized hiatal hernia. No effusions.

Hepatobiliary: No focal hepatic abnormality. Gallbladder
unremarkable.

Pancreas: No focal abnormality or ductal dilatation.

Spleen: No focal abnormality.  Normal size.

Adrenals/Urinary Tract: No adrenal abnormality. No focal renal
abnormality. No stones or hydronephrosis. Urinary bladder is
unremarkable.

Stomach/Bowel: Extensive left colonic diverticulosis. No active
diverticulitis. Moderate stool burden throughout the colon. Normal
appendix. Stomach and small bowel decompressed, grossly
unremarkable.

Vascular/Lymphatic: Aortic atherosclerosis. No evidence of aneurysm
or adenopathy.

Reproductive: Right ovarian cyst measures 2.2 cm compared to 1.5 cm
previously. This very slow growth likely reflects a benign process.
Uterus and left adnexa unremarkable.

Other: No free fluid or free air.

Musculoskeletal: Mild compression fracture through the superior
endplate of L1. This is new since prior lumbar spine imaging from
07/16/2021.
IMPRESSION: Moderate-sized hiatal hernia.

Scarring and likely sequelae of chronic/indolent infection in the
lung bases such as MIO.

Left colonic diverticulosis.  No active diverticulitis.

2.2 cm right ovarian cyst. Very slow growth over the last 12 years
suggests a benign process.

Aortic atherosclerosis.

Mild compression fracture at L1, new since 07/16/2021.

## 2022-06-11 ENCOUNTER — Telehealth: Payer: Self-pay

## 2022-06-11 NOTE — Patient Outreach (Signed)
  Care Coordination   Initial Visit Note   06/11/2022 Name: Akanksha Zelasko MRN: [id number] DOB: 1930/04/05  Sherin Ledingham Stampone is a 89 y.o. year old female who sees Einar Pheasant, MD for primary care. I  spoke with daughter/ designated party release,  Jazmina Pachuca  What matters to the patients health and wellness today?  Daughter states patient does not have any nursing or community resource needs at this time.    Goals Addressed             This Visit's Progress    caer coordination services - no further follow up needed.       Interventions Today    Flowsheet Row Most Recent Value  Chronic Disease   Chronic disease during today's visit Hypertension (HTN)  General Interventions   General Interventions Discussed/Reviewed General Interventions Discussed, Health Screening  [Care coordination program/services discussed, SDOH survey completed.  AWV discussed. Daughter advised to contact pt primary care provider if care coordination services needed in the future.]              SDOH assessments and interventions completed:  Yes  SDOH Interventions Today    Flowsheet Row Most Recent Value  SDOH Interventions   Food Insecurity Interventions Intervention Not Indicated  Housing Interventions Intervention Not Indicated  Transportation Interventions Intervention Not Indicated        Care Coordination Interventions:  Yes, provided   Follow up plan: No further intervention required.   Encounter Outcome:  Pt. Visit Completed   Quinn Plowman RN,BSN,CCM [place] [phone] direct line

## 2022-06-15 ENCOUNTER — Encounter: Payer: Self-pay | Admitting: Internal Medicine

## 2022-06-15 ENCOUNTER — Other Ambulatory Visit: Payer: Self-pay | Admitting: Internal Medicine

## 2022-06-16 ENCOUNTER — Other Ambulatory Visit: Payer: Self-pay

## 2022-06-16 MED ORDER — GABAPENTIN 100 MG PO CAPS: 100.0000 mg | ORAL_CAPSULE | Freq: Three times a day (TID) | ORAL | 3 refills | Status: DC

## 2022-06-30 ENCOUNTER — Inpatient Hospital Stay: Payer: Medicare HMO | Attending: Obstetrics and Gynecology | Admitting: Obstetrics and Gynecology

## 2022-06-30 VITALS — BP 107/59 | HR 55 | Temp 96.2°F | Resp 18 | Ht 64.0 in | Wt 130.0 lb

## 2022-06-30 DIAGNOSIS — Z923 Personal history of irradiation: Secondary | ICD-10-CM | POA: Insufficient documentation

## 2022-06-30 DIAGNOSIS — I10 Essential (primary) hypertension: Secondary | ICD-10-CM | POA: Insufficient documentation

## 2022-06-30 DIAGNOSIS — Z853 Personal history of malignant neoplasm of breast: Secondary | ICD-10-CM | POA: Diagnosis not present

## 2022-06-30 DIAGNOSIS — R918 Other nonspecific abnormal finding of lung field: Secondary | ICD-10-CM

## 2022-06-30 DIAGNOSIS — R971 Elevated cancer antigen 125 [CA 125]: Secondary | ICD-10-CM | POA: Insufficient documentation

## 2022-06-30 DIAGNOSIS — K5900 Constipation, unspecified: Secondary | ICD-10-CM | POA: Diagnosis not present

## 2022-06-30 DIAGNOSIS — N9489 Other specified conditions associated with female genital organs and menstrual cycle: Secondary | ICD-10-CM

## 2022-06-30 DIAGNOSIS — N83201 Unspecified ovarian cyst, right side: Secondary | ICD-10-CM

## 2022-06-30 NOTE — Progress Notes (Signed)
Gynecologic Oncology Interval Visit   Referring Provider: Dr. Leafy Ro  Chief Complaint: right ovarian cyst  Subjective:  Julie Hoover is a 89 y.o. female who is seen in consultation from Dr. Leafy Ro for right ovarian cyst with elevated ca 125.   06/04/2022 CT C/A/P EXAM: CT CHEST, ABDOMEN AND PELVIS WITHOUT CONTRAST   TECHNIQUE: Multidetector CT imaging of the chest, abdomen and pelvis was performed following the standard protocol without IV contrast.   RADIATION DOSE REDUCTION: This exam was performed according to the departmental dose-optimization program which includes automated exposure control, adjustment of the mA and/or kV according to patient size and/or use of iterative reconstruction technique.   COMPARISON:  Multiple priors including MRI thoracic spine October 18, 2021, MRI lumbar spine Aug 22, 2021 and CT abdomen pelvis Aug 22, 2021.   FINDINGS: CT CHEST FINDINGS   Cardiovascular: Aortic and branch vessel atherosclerosis. Calcifications of the aortic annulus. Coronary artery calcifications. Normal size heart. No significant pericardial effusion/thickening   Mediastinum/Nodes: No suspicious thyroid nodule. No pathologically enlarged mediastinal, hilar or axillary lymph nodes. Moderate-sized hiatal hernia   Lungs/Pleura: Biapical pleuroparenchymal scarring. Bilateral bronchial wall thickening with bronchiectasis/bronchiolectasis and areas of mucoid impaction. Peripheral predominant multifocal clustered and tree-in-bud nodularity. Additional discrete pulmonary nodules for instance in the paramedian left upper lobe measuring 9 mm on image 45/4 and in the right middle lobe measuring 11 mm on image 106/4. Progression of the the above findings in the lung bases in comparison to CT Aug 22, 2021.   Musculoskeletal: Diffuse demineralization of bone. No aggressive lytic or blastic lesion of bone. Similar anterior compression deformity at T12.   Prior right  mastectomy and right axillary lymph node dissection.   CT ABDOMEN PELVIS FINDINGS   Hepatobiliary: Unremarkable noncontrast enhanced appearance of the hepatic parenchyma. Gallbladder is unremarkable. No biliary ductal dilation.   Pancreas: No pancreatic ductal dilation or evidence of acute inflammation.   Spleen: No splenomegaly.   Adrenals/Urinary Tract: Bilateral adrenal glands appear normal. Hydronephrosis. No renal, ureteral or bladder calculi. Urinary bladder is unremarkable for degree of distension.   Stomach/Bowel: Moderate-sized hiatal hernia. No pathologic dilation of small or large bowel. Extensive left-sided colonic diverticulosis with wall thickening of the sigmoid colon without adjacent mesenteric inflammatory stranding.   Vascular/Lymphatic: Aortic atherosclerosis. Normal caliber abdominal aorta. No pathologically enlarged abdominal or pelvic lymph nodes.   Reproductive: Increased size of the right ovarian cyst which demonstrates a mural calcification on image 98/2 and now measures 2.5 cm previously 2.2 cm no suspicious left ovarian mass. Atrophic uterus.   Other: No walled off fluid collection. No significant abdominopelvic free fluid. No pneumoperitoneum. No discrete peritoneal or omental nodularity.   Musculoskeletal: No aggressive lytic or blastic lesion of bone. Prior L1 vertebral body augmentation.   IMPRESSION: 1. Increased size of the right ovarian cyst which demonstrates a mural calcification and now measures 2.5 cm previously 2.2 cm. Given elevation of CA 125 and slowly increasing size of this ovarian lesion it is suspicious for a cystic ovarian neoplasm. 2. Extensive left-sided colonic diverticulosis with wall thickening of the sigmoid colon without adjacent mesenteric inflammatory stranding, possibly reflecting sequela of chronic diverticulitis. Consider further evaluation with colonoscopy to exclude underlying mass lesion. 3. Bilateral  bronchial wall thickening with bronchiectasis/bronchiolectasis and areas of mucoid impaction with peripheral predominant multifocal clustered and tree-in-bud nodularity with pulmonary nodules measuring up to 11 mm, nonspecific but most compatible with a chronic atypical infection such as MAI. Suggest short-term interval follow-up dedicated chest CT .  4. Moderate-sized hiatal hernia. 5.  Aortic Atherosclerosis (ICD10-I70.0).  Gynecologic Oncology History  Julie Hoover is a pleasant female who was seen in consultation from Dr. Leafy Ro for right ovarian cyst with elevated ca 125.   08/2021- CT Abdomen & Pelvis for Abdominal pain, acute (Ped 0-17y) generalized abdominal pain and black stools r/o mass  Reproductive: Right ovarian cyst measures 2.2 cm compared to 1.5 cm previously. This very slow growth likely reflects a benign process. Uterus and left adnexa unremarkable.   She was referred to gynecology.   11/23 CA125: 67.4   TAUS 05/12/22 Uterus- 4.76 x 1.67 x 2.48cm Uterus anteverted Endometrium=4.74m Rt ovary not seen Left ovary appears wnl Rt simple adnexal cyst=2.7cm No free fluid seen Fibroid seen: Rt posterior=1.2cm   Dr BLeafy Rorepeating tumor markers.   05/12/22 HE4  173 CA 125 48 Postmenopausal ROMA 52.58%  Imaging reviewed back to June 2013 which revealed resolution of previous right ovarian cyst.   Patient has history of breast cancer in 1993 s/p right mastectomy. Additionally, history of diabetes, esophagitis, melanoma, tubal ligation, glaucoma, urethral dilation.   She is clinically asymptomatic of mass. She declined surgery and opted for continued surveillance.   Problem List: Patient Active Problem List   Diagnosis Date Noted   Ovarian cyst 09/09/2021   Abnormal CT of the abdomen 09/09/2021   Back pain 09/05/2021   Compression fracture of L1 vertebra (HCC) 07/16/2021   Right knee pain 02/17/2021   Need for shingles vaccine 06/02/2020   Dysphagia  01/15/2020   Constipation 04/27/2019   Cerumen impaction 07/11/2018   Dizziness 07/11/2018   Stress 06/27/2017   B12 deficiency 02/28/2017   Sleep disturbance 10/25/2016   GERD (gastroesophageal reflux disease) 12/19/2015   Numbness of left hand 10/01/2015   Environmental allergies 10/01/2015   Left shoulder pain 06/11/2015   Urethral syndrome 12/20/2014   Atrophic vaginitis 12/20/2014   Urethral caruncle 12/20/2014   Health care maintenance 09/27/2014   Toe pain 05/29/2014   Glaucoma 09/25/2013   H/O urethral stricture 05/19/2013   Osteopenia 06/27/2012   Peripheral neuropathy 03/20/2012   Type 2 diabetes mellitus with diabetic polyneuropathy, without long-term current use of insulin [place] [place] 03/19/2012   Hypothyroidism 03/19/2012   History of breast cancer 03/19/2012   Hypercholesterolemia 03/19/2012   Hypertension 03/19/2012    Past Medical History: Past Medical History:  Diagnosis Date   Arthritis    [place] [place]   s/p right lumpectomy, XRT and Tamoxifen, mastectomy   [place]    skin ca   Chicken pox    Colon polyps    Diabetes [place]    Diverticulosis    Environmental allergies    GERD (gastroesophageal reflux disease)    Glaucoma    Hypercholesterolemia    Hyperglycemia    Hypertension    Hypothyroidism    Osteopenia    Peripheral neuropathy    Personal history of radiation therapy 1986   Urethral stricture     Past Surgical History: Past Surgical History:  Procedure Laterality Date   BREAST BIOPSY Right 6/86   lumpectomy-positive/rad   BREAST LUMPECTOMY  1986   right   ESOPHAGOGASTRODUODENOSCOPY (EGD) WITH PROPOFOL N/A 12/17/2015   Procedure: ESOPHAGOGASTRODUODENOSCOPY (EGD) WITH PROPOFOL;  Surgeon: MLollie Sails MD;  [facility];  Service: Endoscopy;  Laterality: N/A;   EYE SURGERY     Bilateral cataracts removed   IR KYPHO LUMBAR INC FX REDUCE BONE BX UNI/BIL CANNULATION INC/IMAGING  09/26/2021   IR RADIOLOGIST  EVAL  & MGMT  10/17/2021   KNEE SURGERY  06/2008   torn meniscus, right   KNEE SURGERY  2012   torn menisucs, right   MASTECTOMY Right 1993   right, recurrence   TUBAL LIGATION     URETHRAL DILATION      Past Gynecologic History:  Last pap: unknown; no h/o abnormal Paps   OB History:  G2P2. Her son passed away at age 38. Her daughter, Lattie Haw, is living and actively participates in patient's care.  OB History  Gravida Para Term Preterm AB Living  2 2          SAB IAB Ectopic Multiple Live Births               # Outcome Date GA Lbr Len/2nd Weight Sex Delivery Anes PTL Lv  2 Para           1 Para             Obstetric Comments  NSVD x 2    Family History: Family History  Problem Relation Age of Onset   COPD Mother    Heart disease Father    Hypertension Father    Hypertension Sister    Hypercholesterolemia Sister    Diabetes Mellitus II Sister    Hypertension Brother        x2   Hypercholesterolemia Brother        x2   Cancer Son        Stomach   Cancer Other        mouth, aunt   Colon cancer Neg Hx    Breast cancer Neg Hx     Social History: Social History   Socioeconomic History   Marital status: Widowed    Spouse name: Not on file   Number of children: 2   Years of education: Not on file   Highest education level: Not on file  Occupational History   Not on file  Tobacco Use   Smoking status: Never   Smokeless tobacco: Never  Substance and Sexual Activity   Alcohol use: No    Alcohol/week: 0.0 standard drinks of alcohol   Drug use: No   Sexual activity: Never  Other Topics Concern   Not on file  Social History Narrative   Not on file   Social Determinants of Health   Financial Resource Strain: Low Risk  (11/28/2021)   Overall Financial Resource Strain (CARDIA)    Difficulty of Paying Living Expenses: Not hard at all  Food Insecurity: No Food Insecurity (06/11/2022)   Hunger Vital Sign    Worried About Running Out of Food in the Last Year: Never  true    Ran Out of Food in the Last Year: Never true  Transportation Needs: No Transportation Needs (06/11/2022)   PRAPARE - Hydrologist (Medical): No    Lack of Transportation (Non-Medical): No  Physical Activity: Not on file  Stress: No Stress Concern Present (11/28/2021)   [place]    Feeling of Stress : Not at all  Social Connections: Unknown (11/28/2021)   Social Connection and Isolation Panel [NHANES]    Frequency of Communication with Friends and Family: More than three times a week    Frequency of Social Gatherings with Friends and Family: More than three times a week    Attends Religious Services: Not on file    Active Member of Clubs or Organizations: Not on  file    Attends Archivist Meetings: Not on file    Marital Status: Widowed  Intimate Partner Violence: Not At Risk (11/28/2021)   Humiliation, Afraid, Rape, and Kick questionnaire    Fear of Current or Ex-Partner: No    Emotionally Abused: No    Physically Abused: No    Sexually Abused: No    Allergies: Allergies  Allergen Reactions   Iodinated Contrast Media     Other reaction(s): Blood Disorder Uncoded Allergy. Allergen: ANESTHESIA ENDING IN THANE   Ciprofloxacin Other (See Comments)    Intolerance when taking large doses   Gentian Root     Blisters    Halothane Other (See Comments)    Hepatitis and elevated liver enzymes   Hydrocodone Itching   Norco [Hydrocodone-Acetaminophen] Itching   Sulfa Antibiotics Rash    Current Medications: Current Outpatient Medications  Medication Sig Dispense Refill   ACCU-CHEK GUIDE test strip USE AS INSTRUCTED 100 strip 3   Accu-Chek Softclix Lancets lancets USE AS INSTRUCTED TO CHECK BLOOD SUGAR ONE TIME DAILY 100 each 3   amLODipine (NORVASC) 5 MG tablet TAKE 1 TABLET EVERY DAY 90 tablet 3   aspirin 81 MG tablet Take 81 mg by mouth daily.     Blood Glucose  Monitoring Suppl (ACCU-CHEK AVIVA PLUS) w/Device KIT 1 Device by Does not apply route daily as needed. 1 kit 0   Calcium Carbonate-Vitamin D 600-400 MG-UNIT chew tablet Chew 1 tablet by mouth 2 (two) times daily.     cetirizine (ZYRTEC) 10 MG tablet Take 10 mg by mouth daily.     dorzolamide-timolol (COSOPT) 22.3-6.8 MG/ML ophthalmic solution Place 1 drop into the left eye 2 (two) times daily.      gabapentin (NEURONTIN) 100 MG capsule Take 1 capsule (100 mg total) by mouth 3 (three) times daily. 90 capsule 3   gabapentin (NEURONTIN) 300 MG capsule TAKE 1 CAPSULE THREE TIMES DAILY 270 capsule 3   latanoprost (XALATAN) 0.005 % ophthalmic solution Place 1 drop into the left eye at bedtime.  5   levothyroxine (SYNTHROID) 50 MCG tablet TAKE 1 TABLET EVERY DAY 90 tablet 1   Multiple Vitamins-Minerals (PRESERVISION AREDS 2) CHEW Chew 2 capsules by mouth.     rosuvastatin (CRESTOR) 20 MG tablet TAKE 1 TABLET EVERY DAY 90 tablet 3   temazepam (RESTORIL) 30 MG capsule Take 1 capsule (30 mg total) by mouth at bedtime as needed. 30 capsule 0   omeprazole (PRILOSEC) 20 MG capsule Take 20 mg by mouth daily. (Patient not taking: Reported on 06/04/2022)     No current facility-administered medications for this visit.    Review of Systems General: no complaints  HEENT: no complaints  Lungs: no complaints  Cardiac: no complaints  GI: no complaints  GU: no complaints  Musculoskeletal: no complaints  Extremities: no complaints  Skin: no complaints  Neuro: no complaints  Endocrine: no complaints  Psych: no complaints       Objective:  Physical Examination:  BP (!) 107/59 (BP Location: Left Arm, Patient Position: Sitting)   Pulse (!) 55   Temp (!) 96.2 F (35.7 C)   Resp 18   Ht '5\' 4"'$  (1.626 m)   Wt 130 lb (59 kg)   SpO2 97%   BMI 22.31 kg/m     ECOG Performance Status: 2 - Symptomatic, <50% confined to bed  GENERAL: Patient is an elderly appearing female in no acute distress. She is able to  ambulate independently short distances. Needed  assistance to walk; somewhat feeble and required arm assistance for guidance.  HEENT:  Sclera clear. Anicteric NODES:  Negative axillary, supraclavicular, lymph node survery LUNGS:  normal respiratory effort  ABDOMEN:  Soft, nontender.  No hernias. No masses or ascites. No organomegaly. NEURO:  Nonfocal. Well oriented.  Appropriate affect.   Pelvic: deferred from last visit.  EGBUS: no lesions Urethra: erythematous meatus Cervix: no lesions, nontender, mobile Vagina: no lesions, no discharge or bleeding Uterus: small size, nontender, mobile Adnexa: no palpable masses; tender on the right with deep palpation Rectovaginal: confirmatory. A lot of stool in the vaulty  Lab Review N/a  Radiologic Imaging: CT scan as per HPI    Assessment:  Julie Hoover is a 89 y.o. female diagnosed with incidental persistent asymptomatic ovarian cystic mass with elevated tumor markers.  Constipation well controlled Performance status of 2  Medical co-morbidities complicating care: HTN, h/o breast cancer and prior radiation Plan:   Problem List Items Addressed This Visit   None Visit Diagnoses     Adnexal mass    -  Primary   Relevant Orders   US PELVIC COMPLETE WITH TRANSVAGINAL   Lung nodules       Relevant Orders   CT Chest Wo Contrast        We discussed options for management including observation versus surgical management.  She is not interested in surgery.  Plan for follow up in 6 months with pelvic ultrasound and chest CT.   I personally performed and evaluated the patient.  I have reviewed her history and available records and have performed the physical exam including general, abdominal exam.  Counseling was completed by me.   I personally saw the patient and performed a substantive portion of this encounter in conjunction with the listed APP as documented above.  Taleisha Kaczynski Gaetana Michaelis, MD     CC:  Dr. Leafy Ro

## 2022-08-11 ENCOUNTER — Ambulatory Visit (INDEPENDENT_AMBULATORY_CARE_PROVIDER_SITE_OTHER): Payer: Medicare HMO | Admitting: Internal Medicine

## 2022-08-11 ENCOUNTER — Encounter: Payer: Self-pay | Admitting: Internal Medicine

## 2022-08-11 VITALS — BP 118/70 | HR 64 | Temp 98.0°F | Resp 16 | Ht 64.0 in | Wt 131.2 lb

## 2022-08-11 DIAGNOSIS — R1310 Dysphagia, unspecified: Secondary | ICD-10-CM

## 2022-08-11 DIAGNOSIS — R198 Other specified symptoms and signs involving the digestive system and abdomen: Secondary | ICD-10-CM

## 2022-08-11 DIAGNOSIS — M545 Low back pain, unspecified: Secondary | ICD-10-CM | POA: Diagnosis not present

## 2022-08-11 DIAGNOSIS — E7800 Pure hypercholesterolemia, unspecified: Secondary | ICD-10-CM

## 2022-08-11 DIAGNOSIS — E039 Hypothyroidism, unspecified: Secondary | ICD-10-CM

## 2022-08-11 DIAGNOSIS — S32010D Wedge compression fracture of first lumbar vertebra, subsequent encounter for fracture with routine healing: Secondary | ICD-10-CM

## 2022-08-11 DIAGNOSIS — R935 Abnormal findings on diagnostic imaging of other abdominal regions, including retroperitoneum: Secondary | ICD-10-CM

## 2022-08-11 DIAGNOSIS — E1142 Type 2 diabetes mellitus with diabetic polyneuropathy: Secondary | ICD-10-CM

## 2022-08-11 DIAGNOSIS — K2100 Gastro-esophageal reflux disease with esophagitis, without bleeding: Secondary | ICD-10-CM | POA: Diagnosis not present

## 2022-08-11 DIAGNOSIS — I10 Essential (primary) hypertension: Secondary | ICD-10-CM

## 2022-08-11 DIAGNOSIS — N83201 Unspecified ovarian cyst, right side: Secondary | ICD-10-CM

## 2022-08-11 DIAGNOSIS — Z853 Personal history of malignant neoplasm of breast: Secondary | ICD-10-CM

## 2022-08-11 DIAGNOSIS — F439 Reaction to severe stress, unspecified: Secondary | ICD-10-CM

## 2022-08-11 DIAGNOSIS — I700 Atherosclerosis of aorta: Secondary | ICD-10-CM

## 2022-08-11 DIAGNOSIS — G6289 Other specified polyneuropathies: Secondary | ICD-10-CM

## 2022-08-11 LAB — HEPATIC FUNCTION PANEL
ALT: 13 U/L (ref 0–35)
AST: 17 U/L (ref 0–37)
Albumin: 4.2 g/dL (ref 3.5–5.2)
Alkaline Phosphatase: 59 U/L (ref 39–117)
Bilirubin, Direct: 0.1 mg/dL (ref 0.0–0.3)
Total Bilirubin: 0.6 mg/dL (ref 0.2–1.2)
Total Protein: 7.1 g/dL (ref 6.0–8.3)

## 2022-08-11 LAB — BASIC METABOLIC PANEL
BUN: 21 mg/dL (ref 6–23)
CO2: 27 mEq/L (ref 19–32)
Calcium: 9.8 mg/dL (ref 8.4–10.5)
Chloride: 104 mEq/L (ref 96–112)
Creatinine, Ser: 0.92 mg/dL (ref 0.40–1.20)
GFR: 54.69 mL/min — ABNORMAL LOW (ref >60.00)
Glucose, Bld: 96 mg/dL (ref 70–99)
Potassium: 4 mEq/L (ref 3.5–5.1)
Sodium: 139 mEq/L (ref 135–145)

## 2022-08-11 LAB — CBC WITH DIFFERENTIAL/PLATELET
Basophils Absolute: 0.1 10*3/uL (ref 0.0–0.1)
Basophils Relative: 0.8 % (ref 0.0–3.0)
Eosinophils Absolute: 0.1 10*3/uL (ref 0.0–0.7)
Eosinophils Relative: 0.9 % (ref 0.0–5.0)
HCT: 40.6 % (ref 36.0–46.0)
Hemoglobin: 13.2 g/dL (ref 12.0–15.0)
Lymphocytes Relative: 26 % (ref 12.0–46.0)
Lymphs Abs: 2.1 10*3/uL (ref 0.7–4.0)
MCHC: 32.6 g/dL (ref 30.0–36.0)
MCV: 93.5 fl (ref 78.0–100.0)
Monocytes Absolute: 0.8 10*3/uL (ref 0.1–1.0)
Monocytes Relative: 9.4 % (ref 3.0–12.0)
Neutro Abs: 5.1 10*3/uL (ref 1.4–7.7)
Neutrophils Relative %: 62.9 % (ref 43.0–77.0)
Platelets: 226 10*3/uL (ref 150.0–400.0)
RBC: 4.34 Mil/uL (ref 3.87–5.11)
RDW: 14.3 % (ref 11.5–15.5)
WBC: 8.2 10*3/uL (ref 4.0–10.5)

## 2022-08-11 LAB — HM DIABETES FOOT EXAM

## 2022-08-11 LAB — HEMOGLOBIN A1C: Hgb A1c MFr Bld: 6.4 % (ref 4.6–6.5)

## 2022-08-11 MED ORDER — GABAPENTIN 100 MG PO CAPS: 100.0000 mg | ORAL_CAPSULE | Freq: Every day | ORAL | 3 refills | Status: DC

## 2022-08-11 NOTE — Patient Instructions (Signed)
Benefiber - daily 

## 2022-08-11 NOTE — Progress Notes (Unsigned)
Subjective:    Patient ID: Julie Hoover, female    DOB: 10-29-1929, 89 y.o.   MRN: [id number]  Patient here for  Chief Complaint  Patient presents with   Medical Management of Chronic Issues    HPI Here to follow up regarding her hypercholesterolemia, diabetes and hypertension.  She is accompanied by her daughter.  History obtained from both of them. Had L1 compression fracture.  S/p kyphoplasty.  Also T12 compression fracture.  Is better overall. Taking gabapentin.  Reports this is working well for her. Has been seeing Dr Dalbert Garnet and Dr Sonia Side for f/u - ovarian mass with elevated tumor markers.  Desires no further intervention at this time.  She is scheduled for a f/u CT chest and abd/pelvis in 12/2022.  Desires no further intervention prior to this.  Discussed lung findings.  Wants to hold to repeat chest CT in 12/2022. No cough or congestion.  No chest pain.  Breathing stable.  On protonix.  Issues with dysphagia. History of zenker's diverticulum.  Discussed further evaluation through GI and/or ST.  She will think about this and let me know if desires.  Discussed using walker.    Past Medical History:  Diagnosis Date   Arthritis    Breast cancer 1986   s/p right lumpectomy, XRT and Tamoxifen, mastectomy   Cancer    skin ca   Chicken pox    Colon polyps    Diabetes    Diverticulosis    Environmental allergies    GERD (gastroesophageal reflux disease)    Glaucoma    Hypercholesterolemia    Hyperglycemia    Hypertension    Hypothyroidism    Osteopenia    Peripheral neuropathy    Personal history of radiation therapy 1986   Urethral stricture    Past Surgical History:  Procedure Laterality Date   BREAST BIOPSY Right 6/86   lumpectomy-positive/rad   BREAST LUMPECTOMY  1986   right   ESOPHAGOGASTRODUODENOSCOPY (EGD) WITH PROPOFOL N/A 12/17/2015   Procedure: ESOPHAGOGASTRODUODENOSCOPY (EGD) WITH PROPOFOL;  Surgeon: Christena Deem, MD;  Location: [facility] ENDOSCOPY;   Service: Endoscopy;  Laterality: N/A;   EYE SURGERY     Bilateral cataracts removed   IR KYPHO LUMBAR INC FX REDUCE BONE BX UNI/BIL CANNULATION INC/IMAGING  09/26/2021   IR RADIOLOGIST EVAL & MGMT  10/17/2021   KNEE SURGERY  06/2008   torn meniscus, right   KNEE SURGERY  2012   torn menisucs, right   MASTECTOMY Right 1993   right, recurrence   TUBAL LIGATION     URETHRAL DILATION     Family History  Problem Relation Age of Onset   COPD Mother    Heart disease Father    Hypertension Father    Hypertension Sister    Hypercholesterolemia Sister    Diabetes Mellitus II Sister    Hypertension Brother        x2   Hypercholesterolemia Brother        x2   Cancer Son        Stomach   Cancer Other        mouth, aunt   Colon cancer Neg Hx    Breast cancer Neg Hx    Social History   Socioeconomic History   Marital status: Widowed    Spouse name: Not on file   Number of children: 2   Years of education: Not on file   Highest education level: Not on file  Occupational History   Not on  file  Tobacco Use   Smoking status: Never   Smokeless tobacco: Never  Substance and Sexual Activity   Alcohol use: No    Alcohol/week: 0.0 standard drinks of alcohol   Drug use: No   Sexual activity: Never  Other Topics Concern   Not on file  Social History Narrative   Not on file   Social Determinants of Health   Financial Resource Strain: Low Risk  (11/28/2021)   Overall Financial Resource Strain (CARDIA)    Difficulty of Paying Living Expenses: Not hard at all  Food Insecurity: No Food Insecurity (06/11/2022)   Hunger Vital Sign    Worried About Running Out of Food in the Last Year: Never true    Ran Out of Food in the Last Year: Never true  Transportation Needs: No Transportation Needs (06/11/2022)   PRAPARE - Administrator, Civil Service (Medical): No    Lack of Transportation (Non-Medical): No  Physical Activity: Not on file  Stress: No Stress Concern Present  (11/28/2021)   [employer] of Occupational Health - Occupational Stress Questionnaire    Feeling of Stress : Not at all  Social Connections: Unknown (11/28/2021)   Social Connection and Isolation Panel [NHANES]    Frequency of Communication with Friends and Family: More than three times a week    Frequency of Social Gatherings with Friends and Family: More than three times a week    Attends Religious Services: Not on file    Active Member of Clubs or Organizations: Not on file    Attends Banker Meetings: Not on file    Marital Status: Widowed     Review of Systems  Constitutional:  Negative for appetite change and unexpected weight change.  HENT:  Negative for congestion and sinus pressure.   Respiratory:  Negative for cough, chest tightness and shortness of breath.   Cardiovascular:  Negative for chest pain, palpitations and leg swelling.  Gastrointestinal:  Negative for abdominal pain, nausea and vomiting.       Some bowel issues - diarrhea alternating with constipation.  Discussed benefiber.   Genitourinary:  Negative for difficulty urinating and dysuria.  Musculoskeletal:  Negative for joint swelling and myalgias.  Skin:  Negative for color change and rash.  Neurological:  Negative for dizziness and headaches.  Psychiatric/Behavioral:  Negative for agitation and dysphoric mood.        Objective:     BP 118/70   Pulse 64   Temp 98 F (36.7 C)   Resp 16   Ht 5\' 4"  (1.626 m)   Wt 131 lb 3.2 oz (59.5 kg)   SpO2 97%   BMI 22.52 kg/m  Wt Readings from Last 3 Encounters:  08/11/22 131 lb 3.2 oz (59.5 kg)  06/30/22 130 lb (59 kg)  06/04/22 130 lb (59 kg)    Physical Exam Vitals reviewed.  Constitutional:      General: She is not in acute distress.    Appearance: Normal appearance.  HENT:     Head: Normocephalic and atraumatic.     Right Ear: External ear normal.     Left Ear: External ear normal.  Eyes:     General: No scleral icterus.        Right eye: No discharge.        Left eye: No discharge.     Conjunctiva/sclera: Conjunctivae normal.  Neck:     Thyroid: No thyromegaly.  Cardiovascular:     Rate and Rhythm: Normal  rate and regular rhythm.  Pulmonary:     Effort: No respiratory distress.     Breath sounds: Normal breath sounds. No wheezing.  Abdominal:     General: Bowel sounds are normal.     Palpations: Abdomen is soft.     Tenderness: There is no abdominal tenderness.  Musculoskeletal:        General: No swelling or tenderness.     Cervical back: Neck supple. No tenderness.  Lymphadenopathy:     Cervical: No cervical adenopathy.  Skin:    Findings: No erythema or rash.  Neurological:     Mental Status: She is alert.  Psychiatric:        Mood and Affect: Mood normal.        Behavior: Behavior normal.      Outpatient Encounter Medications as of 08/11/2022  Medication Sig   ACCU-CHEK GUIDE test strip USE AS INSTRUCTED   Accu-Chek Softclix Lancets lancets USE AS INSTRUCTED TO CHECK BLOOD SUGAR ONE TIME DAILY   amLODipine (NORVASC) 5 MG tablet TAKE 1 TABLET EVERY DAY   aspirin 81 MG tablet Take 81 mg by mouth daily.   Blood Glucose Monitoring Suppl (ACCU-CHEK AVIVA PLUS) w/Device KIT 1 Device by Does not apply route daily as needed.   Calcium Carbonate-Vitamin D 600-400 MG-UNIT chew tablet Chew 1 tablet by mouth 2 (two) times daily.   cetirizine (ZYRTEC) 10 MG tablet Take 10 mg by mouth daily.   dorzolamide-timolol (COSOPT) 22.3-6.8 MG/ML ophthalmic solution Place 1 drop into the left eye 2 (two) times daily.    gabapentin (NEURONTIN) 300 MG capsule TAKE 1 CAPSULE THREE TIMES DAILY   latanoprost (XALATAN) 0.005 % ophthalmic solution Place 1 drop into the left eye at bedtime.   levothyroxine (SYNTHROID) 50 MCG tablet TAKE 1 TABLET EVERY DAY   rosuvastatin (CRESTOR) 20 MG tablet TAKE 1 TABLET EVERY DAY   temazepam (RESTORIL) 30 MG capsule Take 1 capsule (30 mg total) by mouth at bedtime as needed.    [DISCONTINUED] gabapentin (NEURONTIN) 100 MG capsule Take 1 capsule (100 mg total) by mouth 3 (three) times daily.   [DISCONTINUED] Multiple Vitamins-Minerals (PRESERVISION AREDS 2) CHEW Chew 2 capsules by mouth.   [DISCONTINUED] omeprazole (PRILOSEC) 20 MG capsule Take 20 mg by mouth daily.   gabapentin (NEURONTIN) 100 MG capsule Take 1 capsule (100 mg total) by mouth daily.   No facility-administered encounter medications on file as of 08/11/2022.     Lab Results  Component Value Date   WBC 8.2 08/11/2022   HGB 13.2 08/11/2022   HCT 40.6 08/11/2022   PLT 226.0 08/11/2022   GLUCOSE 96 08/11/2022   CHOL 159 03/31/2022   TRIG 63.0 03/31/2022   HDL 73.00 03/31/2022   LDLCALC 73 03/31/2022   ALT 13 08/11/2022   AST 17 08/11/2022   NA 139 08/11/2022   K 4.0 08/11/2022   CL 104 08/11/2022   CREATININE 0.92 08/11/2022   BUN 21 08/11/2022   CO2 27 08/11/2022   TSH 1.56 03/31/2022   HGBA1C 6.4 08/11/2022   MICROALBUR <0.7 01/31/2021    CT CHEST ABDOMEN PELVIS WO CONTRAST  Result Date: 06/04/2022 CLINICAL DATA:  Ovarian cyst, elevated CA 125. * Tracking Code: BO * EXAM: CT CHEST, ABDOMEN AND PELVIS WITHOUT CONTRAST TECHNIQUE: Multidetector CT imaging of the chest, abdomen and pelvis was performed following the standard protocol without IV contrast. RADIATION DOSE REDUCTION: This exam was performed according to the departmental dose-optimization program which includes automated exposure control, adjustment of  the mA and/or kV according to patient size and/or use of iterative reconstruction technique. COMPARISON:  Multiple priors including MRI thoracic spine October 18, 2021, MRI lumbar spine Aug 22, 2021 and CT abdomen pelvis Aug 22, 2021. FINDINGS: CT CHEST FINDINGS Cardiovascular: Aortic and branch vessel atherosclerosis. Calcifications of the aortic annulus. Coronary artery calcifications. Normal size heart. No significant pericardial effusion/thickening Mediastinum/Nodes: No suspicious thyroid  nodule. No pathologically enlarged mediastinal, hilar or axillary lymph nodes. Moderate-sized hiatal hernia Lungs/Pleura: Biapical pleuroparenchymal scarring. Bilateral bronchial wall thickening with bronchiectasis/bronchiolectasis and areas of mucoid impaction. Peripheral predominant multifocal clustered and tree-in-bud nodularity. Additional discrete pulmonary nodules for instance in the paramedian left upper lobe measuring 9 mm on image 45/4 and in the right middle lobe measuring 11 mm on image 106/4. Progression of the the above findings in the lung bases in comparison to CT Aug 22, 2021. Musculoskeletal: Diffuse demineralization of bone. No aggressive lytic or blastic lesion of bone. Similar anterior compression deformity at T12. Prior right mastectomy and right axillary lymph node dissection. CT ABDOMEN PELVIS FINDINGS Hepatobiliary: Unremarkable noncontrast enhanced appearance of the hepatic parenchyma. Gallbladder is unremarkable. No biliary ductal dilation. Pancreas: No pancreatic ductal dilation or evidence of acute inflammation. Spleen: No splenomegaly. Adrenals/Urinary Tract: Bilateral adrenal glands appear normal. Hydronephrosis. No renal, ureteral or bladder calculi. Urinary bladder is unremarkable for degree of distension. Stomach/Bowel: Moderate-sized hiatal hernia. No pathologic dilation of small or large bowel. Extensive left-sided colonic diverticulosis with wall thickening of the sigmoid colon without adjacent mesenteric inflammatory stranding. Vascular/Lymphatic: Aortic atherosclerosis. Normal caliber abdominal aorta. No pathologically enlarged abdominal or pelvic lymph nodes. Reproductive: Increased size of the right ovarian cyst which demonstrates a mural calcification on image 98/2 and now measures 2.5 cm previously 2.2 cm no suspicious left ovarian mass. Atrophic uterus. Other: No walled off fluid collection. No significant abdominopelvic free fluid. No pneumoperitoneum. No discrete  peritoneal or omental nodularity. Musculoskeletal: No aggressive lytic or blastic lesion of bone. Prior L1 vertebral body augmentation. IMPRESSION: 1. Increased size of the right ovarian cyst which demonstrates a mural calcification and now measures 2.5 cm previously 2.2 cm. Given elevation of CA 125 and slowly increasing size of this ovarian lesion it is suspicious for a cystic ovarian neoplasm. 2. Extensive left-sided colonic diverticulosis with wall thickening of the sigmoid colon without adjacent mesenteric inflammatory stranding, possibly reflecting sequela of chronic diverticulitis. Consider further evaluation with colonoscopy to exclude underlying mass lesion. 3. Bilateral bronchial wall thickening with bronchiectasis/bronchiolectasis and areas of mucoid impaction with peripheral predominant multifocal clustered and tree-in-bud nodularity with pulmonary nodules measuring up to 11 mm, nonspecific but most compatible with a chronic atypical infection such as MAI. Suggest short-term interval follow-up dedicated chest CT . 4. Moderate-sized hiatal hernia. 5.  Aortic Atherosclerosis (ICD10-I70.0). Electronically Signed   By: Maudry Mayhew M.D.   On: 06/04/2022 11:03       Assessment & Plan:  Primary hypertension Assessment & Plan: Continue amlodipine. Follow pressures.  Follow metabolic panel.    Hypercholesterolemia Assessment & Plan: Continue crestor.  Low cholesterol diet and exercise.  Follow lipid panel and liver function tests.    Orders: -     CBC with Differential/Platelet -     Hepatic function panel  Hypothyroidism, unspecified type Assessment & Plan: On thyroid replacement.  Follow tsh.     Type 2 diabetes mellitus with diabetic polyneuropathy, without long-term current use of insulin Assessment & Plan:  Follow met b and a1c. Reviewed outside sugars.  Lab Results  Component Value Date   HGBA1C 6.4 08/11/2022     Orders: -     Basic metabolic panel -     Hemoglobin  A1c -     Microalbumin / creatinine urine ratio; Future  Abnormal CT of the abdomen Assessment & Plan: Discussed lung findings.  Has been reviewed by Dr Sonia Side.  Is scheduled for f/u chest CT in 12/2022.  Discussed today.  Desires no further intervention.    Left-sided low back pain without sciatica, unspecified chronicity Assessment & Plan: On gabapentin.  Feels this dose is working well.  Follow.    Compression fracture of L1 vertebra with routine healing, subsequent encounter Assessment & Plan: S/p kyphoplasty.  F/u revealed a new T12 compression fracture as noted previously.  Has seen ortho as outlined previously.  Continue gabapentin as outlined.  Follow.   Dysphagia, unspecified type Assessment & Plan: On protonix.  Still reports some dysphagia.   Again discussed further evaluation, including swallowing evaluation and referral back to GI.  Wants to follow.  Previously saw GI.  Zenker's diverticulum.  Wants to hold on further intervention. Have discussed eating slowly.  Small bites.  Chew food well.    Gastroesophageal reflux disease with esophagitis without hemorrhage Assessment & Plan: No acid reflux reported.  On protonix. Follow.    History of breast cancer Assessment & Plan: 02/10/22 - Birads I (left mammogram) s/p right mastectomy.    Aortic atherosclerosis Assessment & Plan: Continue crestor.    Stress Assessment & Plan: Has good support.  Staying with daughter now.  Follow.    Other polyneuropathy Assessment & Plan: On gabapentin.  Continue current dose. Stable.    Cyst of right ovary Assessment & Plan: Saw Dr Sonia Side 06/30/22 - Ms Volkov not interested in surgery.  Plan pelvic ultrasound and CT chest/abd - as outlined.     Change in bowel movement Assessment & Plan: Has been drinking prune juice and some stool softener.  Some occasional loose.  Discussed benefiber.    Other orders -     Gabapentin; Take 1 capsule (100 mg total) by mouth daily.   Dispense: 90 capsule; Refill: 3     Dale Edinburg, MD

## 2022-08-12 ENCOUNTER — Encounter: Payer: Self-pay | Admitting: Internal Medicine

## 2022-08-12 DIAGNOSIS — I700 Atherosclerosis of aorta: Secondary | ICD-10-CM | POA: Insufficient documentation

## 2022-08-12 DIAGNOSIS — R198 Other specified symptoms and signs involving the digestive system and abdomen: Secondary | ICD-10-CM | POA: Insufficient documentation

## 2022-08-12 NOTE — Assessment & Plan Note (Signed)
S/p kyphoplasty.  F/u revealed a new T12 compression fracture as noted previously.  Has seen ortho as outlined previously.  Continue gabapentin as outlined.  Follow.

## 2022-08-12 NOTE — Assessment & Plan Note (Signed)
On thyroid replacement.  Follow tsh.  

## 2022-08-12 NOTE — Assessment & Plan Note (Signed)
Saw Dr Sonia Side 06/30/22 - Ms Burr not interested in surgery.  Plan pelvic ultrasound and CT chest/abd - as outlined.

## 2022-08-12 NOTE — Assessment & Plan Note (Signed)
No acid reflux reported.  On protonix. Follow.  

## 2022-08-12 NOTE — Assessment & Plan Note (Signed)
Discussed lung findings.  Has been reviewed by Dr Sonia Side.  Is scheduled for f/u chest CT in 12/2022.  Discussed today.  Desires no further intervention.

## 2022-08-12 NOTE — Assessment & Plan Note (Signed)
On gabapentin.  Feels this dose is working well.  Follow.

## 2022-08-12 NOTE — Assessment & Plan Note (Signed)
Follow met b and a1c. Reviewed outside sugars.  Lab Results  Component Value Date   HGBA1C 6.4 08/11/2022

## 2022-08-12 NOTE — Assessment & Plan Note (Signed)
Has been drinking prune juice and some stool softener.  Some occasional loose.  Discussed benefiber.

## 2022-08-12 NOTE — Assessment & Plan Note (Signed)
02/10/22 - Birads I (left mammogram) s/p right mastectomy.  

## 2022-08-12 NOTE — Assessment & Plan Note (Signed)
Continue crestor 

## 2022-08-12 NOTE — Assessment & Plan Note (Signed)
Continue crestor.  Low cholesterol diet and exercise.  Follow lipid panel and liver function tests.  

## 2022-08-12 NOTE — Assessment & Plan Note (Signed)
On protonix.  Still reports some dysphagia.   Again discussed further evaluation, including swallowing evaluation and referral back to GI.  Wants to follow.  Previously saw GI.  Zenker's diverticulum.  Wants to hold on further intervention. Have discussed eating slowly.  Small bites.  Chew food well.

## 2022-08-12 NOTE — Assessment & Plan Note (Signed)
On gabapentin.  Continue current dose. Stable.

## 2022-08-12 NOTE — Assessment & Plan Note (Addendum)
Has good support.  Staying with daughter now.  Follow.

## 2022-08-12 NOTE — Assessment & Plan Note (Signed)
Continue amlodipine.  Follow pressures.  Follow metabolic panel.   

## 2022-08-13 ENCOUNTER — Telehealth: Payer: Self-pay

## 2022-08-13 DIAGNOSIS — R944 Abnormal results of kidney function studies: Secondary | ICD-10-CM

## 2022-08-14 NOTE — Telephone Encounter (Signed)
BMP ordered

## 2022-09-08 DIAGNOSIS — L578 Other skin changes due to chronic exposure to nonionizing radiation: Secondary | ICD-10-CM | POA: Diagnosis not present

## 2022-09-08 DIAGNOSIS — Z859 Personal history of malignant neoplasm, unspecified: Secondary | ICD-10-CM | POA: Diagnosis not present

## 2022-09-08 DIAGNOSIS — Z85820 Personal history of malignant melanoma of skin: Secondary | ICD-10-CM | POA: Diagnosis not present

## 2022-10-08 ENCOUNTER — Telehealth: Payer: Self-pay | Admitting: Internal Medicine

## 2022-10-08 ENCOUNTER — Other Ambulatory Visit: Payer: Self-pay

## 2022-10-08 DIAGNOSIS — E1142 Type 2 diabetes mellitus with diabetic polyneuropathy: Secondary | ICD-10-CM

## 2022-10-08 MED ORDER — ACCU-CHEK SOFTCLIX LANCETS MISC: 3 refills | Status: DC

## 2022-10-08 NOTE — Telephone Encounter (Signed)
Medication refilled

## 2022-10-08 NOTE — Telephone Encounter (Signed)
Prescription Request  10/08/2022  LOV: 08/11/2022  What is the name of the medication or equipment? Accu-Chek Softclix Lancets lancets  Have you contacted your pharmacy to request a refill? Yes   Which pharmacy would you like this sent to?   [facility] - [place], [place] - [address] [place] [address] Phone: [phone] Fax: [phone]   Patient notified that their request is being sent to the clinical staff for review and that they should receive a response within 2 business days.   Please advise at [facility] [phone]

## 2022-10-09 ENCOUNTER — Other Ambulatory Visit: Payer: Self-pay | Admitting: Internal Medicine

## 2022-10-09 DIAGNOSIS — E1142 Type 2 diabetes mellitus with diabetic polyneuropathy: Secondary | ICD-10-CM

## 2022-10-25 ENCOUNTER — Other Ambulatory Visit: Payer: Self-pay | Admitting: Internal Medicine

## 2022-11-04 ENCOUNTER — Other Ambulatory Visit: Payer: Self-pay | Admitting: Internal Medicine

## 2022-12-01 ENCOUNTER — Ambulatory Visit (INDEPENDENT_AMBULATORY_CARE_PROVIDER_SITE_OTHER): Payer: Medicare HMO | Admitting: *Deleted

## 2022-12-01 ENCOUNTER — Telehealth: Payer: Self-pay | Admitting: *Deleted

## 2022-12-01 VITALS — Ht 64.0 in | Wt 120.0 lb

## 2022-12-01 DIAGNOSIS — Z1231 Encounter for screening mammogram for malignant neoplasm of breast: Secondary | ICD-10-CM

## 2022-12-01 DIAGNOSIS — Z0000 Encounter for general adult medical examination without abnormal findings: Secondary | ICD-10-CM | POA: Diagnosis not present

## 2022-12-01 DIAGNOSIS — Z780 Asymptomatic menopausal state: Secondary | ICD-10-CM | POA: Diagnosis not present

## 2022-12-01 NOTE — Telephone Encounter (Signed)
Called patient to perform her AWV. Patient stated that she planned on calling Dr. Lorin Picket about problems she has been having with her bowels. Patient stated that she was having problems with constipation about 3 weeks ago so she started taking Miralax and a stool softener. Patient stated that she then started with diarrhea about a week after starting the medication for constipation. Patient stated that she stopped taking the Miralax but is continuing to take the stool softener. Patient stated that she is now having bowel movements every other day and it is diarrhea. Patient denies abdominal pain or any bloody stools. Patient wants to know what Dr. Lorin Picket would recommend that she do. Patient has an upcoming appointment scheduled with Dr. Lorin Picket [facility] [place], [facility]

## 2022-12-01 NOTE — Telephone Encounter (Signed)
Reviewed.  Per note, no abdominal pain or blood in stool.  Confirm eating and no nausea or vomiting. Recommend holding stool softener and start benefiber daily.  Agree with f/u appt.  Call if persistent problems. Needs evaluation if any acute problems.

## 2022-12-01 NOTE — Progress Notes (Signed)
Subjective:   Julie Hoover is a 89 y.o. female who presents for Medicare Annual (Subsequent) preventive examination.  Visit Complete: Virtual  I connected with  Julie Hoover on 12/01/22 by a audio enabled telemedicine application and verified that I am speaking with the correct person using two identifiers.  Patient Location: Home  Provider Location: Office/Clinic  I discussed the limitations of evaluation and management by telemedicine. The patient expressed understanding and agreed to proceed.  Vital Signs: Unable to obtain new vitals due to this being a telehealth visit.  Review of Systems     Cardiac Risk Factors include: advanced age (>82men, >5 women);diabetes mellitus;hypertension;dyslipidemia;sedentary lifestyle     Objective:    Today's Vitals   12/01/22 1027  Weight: 120 lb (54.4 kg)  Height: 5\' 4"  (1.626 m)   Body mass index is 20.6 kg/m.     12/01/2022   10:48 AM 06/30/2022    8:46 AM 06/04/2022    8:11 AM 11/28/2021    9:54 AM 11/27/2020    9:21 AM 11/23/2019    9:12 AM 10/28/2016   10:32 AM  Advanced Directives  Does Patient Have a Medical Advance Directive? Yes Yes Yes Yes Yes Yes Yes  Type of Estate agent of [place];Living will Healthcare Power of [place];Living will  Healthcare Power of [place];Living will Healthcare Power of [place];Living will Healthcare Power of Attorney Living will;Healthcare Power of Attorney  Does patient want to make changes to medical advance directive?  No - Patient declined  No - Patient declined No - Patient declined No - Patient declined No - Patient declined  Copy of Healthcare Power of Attorney in Chart? No - copy requested No - copy requested  No - copy requested No - copy requested No - copy requested No - copy requested    Current Medications (verified) Outpatient Encounter Medications as of 12/01/2022  Medication Sig   ACCU-CHEK GUIDE test strip USE AS INSTRUCTED   Accu-Chek  Softclix Lancets lancets USE AS INSTRUCTED TO CHECK BLOOD SUGAR ONE TIME DAILY   amLODipine (NORVASC) 5 MG tablet TAKE 1 TABLET EVERY DAY   aspirin 81 MG tablet Take 81 mg by mouth daily.   Blood Glucose Monitoring Suppl (ACCU-CHEK AVIVA PLUS) w/Device KIT 1 Device by Does not apply route daily as needed.   Calcium Carbonate-Vitamin D 600-400 MG-UNIT chew tablet Chew 1 tablet by mouth 2 (two) times daily.   cetirizine (ZYRTEC) 10 MG tablet Take 10 mg by mouth daily.   dorzolamide-timolol (COSOPT) 22.3-6.8 MG/ML ophthalmic solution Place 1 drop into the left eye 2 (two) times daily.    gabapentin (NEURONTIN) 100 MG capsule Take 1 capsule (100 mg total) by mouth daily.   gabapentin (NEURONTIN) 300 MG capsule TAKE 1 CAPSULE THREE TIMES DAILY   latanoprost (XALATAN) 0.005 % ophthalmic solution Place 1 drop into the left eye at bedtime.   levothyroxine (SYNTHROID) 50 MCG tablet TAKE 1 TABLET EVERY DAY   rosuvastatin (CRESTOR) 20 MG tablet TAKE 1 TABLET EVERY DAY   temazepam (RESTORIL) 30 MG capsule Take 1 capsule (30 mg total) by mouth at bedtime as needed.   No facility-administered encounter medications on file as of 12/01/2022.    Allergies (verified) Iodinated contrast media, Ciprofloxacin, Gentian root, Halothane, Hydrocodone, Norco [hydrocodone-acetaminophen], and Sulfa antibiotics   History: Past Medical History:  Diagnosis Date   Arthritis    Breast cancer (HCC) 1986   s/p right lumpectomy, XRT and Tamoxifen, mastectomy   Cancer (HCC)  skin ca   Chicken pox    Colon polyps    Diabetes (HCC)    Diverticulosis    Environmental allergies    GERD (gastroesophageal reflux disease)    Glaucoma    Hypercholesterolemia    Hyperglycemia    Hypertension    Hypothyroidism    Osteopenia    Peripheral neuropathy    Personal history of radiation therapy 1986   Urethral stricture    Past Surgical History:  Procedure Laterality Date   BREAST BIOPSY Right 6/86    lumpectomy-positive/rad   BREAST LUMPECTOMY  1986   right   ESOPHAGOGASTRODUODENOSCOPY (EGD) WITH PROPOFOL N/A 12/17/2015   Procedure: ESOPHAGOGASTRODUODENOSCOPY (EGD) WITH PROPOFOL;  Surgeon: Christena Deem, MD;  Location: [facility] ENDOSCOPY;  Service: Endoscopy;  Laterality: N/A;   EYE SURGERY     Bilateral cataracts removed   IR KYPHO LUMBAR INC FX REDUCE BONE BX UNI/BIL CANNULATION INC/IMAGING  09/26/2021   IR RADIOLOGIST EVAL & MGMT  10/17/2021   KNEE SURGERY  06/2008   torn meniscus, right   KNEE SURGERY  2012   torn menisucs, right   MASTECTOMY Right 1993   right, recurrence   TUBAL LIGATION     URETHRAL DILATION     Family History  Problem Relation Age of Onset   COPD Mother    Heart disease Father    Hypertension Father    Hypertension Sister    Hypercholesterolemia Sister    Diabetes Mellitus II Sister    Hypertension Brother        x2   Hypercholesterolemia Brother        x2   Cancer Son        Stomach   Cancer Other        mouth, aunt   Colon cancer Neg Hx    Breast cancer Neg Hx    Social History   Socioeconomic History   Marital status: Widowed    Spouse name: Not on file   Number of children: 2   Years of education: Not on file   Highest education level: Not on file  Occupational History   Not on file  Tobacco Use   Smoking status: Never   Smokeless tobacco: Never  Substance and Sexual Activity   Alcohol use: No    Alcohol/week: 0.0 standard drinks of alcohol   Drug use: No   Sexual activity: Never  Other Topics Concern   Not on file  Social History Narrative   Lives with [facility]   Social Determinants of Health   Financial Resource Strain: Low Risk  (12/01/2022)   Overall Financial Resource Strain (CARDIA)    Difficulty of Paying Living Expenses: Not hard at all  Food Insecurity: No Food Insecurity (12/01/2022)   Hunger Vital Sign    Worried About Running Out of Food in the Last Year: Never true    Ran Out of Food in the Last  Year: Never true  Transportation Needs: No Transportation Needs (12/01/2022)   PRAPARE - Administrator, Civil Service (Medical): No    Lack of Transportation (Non-Medical): No  Physical Activity: Inactive (12/01/2022)   Exercise Vital Sign    Days of Exercise per Week: 0 days    Minutes of Exercise per Session: 0 min  Stress: No Stress Concern Present (12/01/2022)   [employer] of Occupational Health - Occupational Stress Questionnaire    Feeling of Stress : Not at all  Social Connections: Socially Isolated (12/01/2022)  Social Advertising account executive [NHANES]    Frequency of Communication with Friends and Family: More than three times a week    Frequency of Social Gatherings with Friends and Family: More than three times a week    Attends Religious Services: Never    Database administrator or Organizations: No    Attends Banker Meetings: Never    Marital Status: Widowed    Tobacco Counseling Counseling given: Not Answered   Clinical Intake:  Pre-visit preparation completed: Yes  Pain : No/denies pain     BMI - recorded: 20.6 Nutritional Status: BMI of 19-24  Normal Nutritional Risks: None Diabetes: Yes CBG done?: Yes (fasting BS 88) CBG resulted in Enter/ Edit results?: No Did pt. bring in CBG monitor from home?: No  How often do you need to have someone help you when you read instructions, pamphlets, or other written materials from your doctor or pharmacy?: 1 - Never  Interpreter Needed?: No  Information entered by :: R.  LPN   Activities of Daily Living    12/01/2022   10:32 AM  In your present state of health, do you have any difficulty performing the following activities:  Hearing? 0  Vision? 0  Comment readers  Difficulty concentrating or making decisions? 1  Comment remembering things  Walking or climbing stairs? 1  Comment needs a little help  Dressing or bathing? 0  Doing errands, shopping? 1  Comment  daughter helps  Preparing Food and eating ? N  Using the Toilet? N  In the past six months, have you accidently leaked urine? N  Do you have problems with loss of bowel control? N  Managing your Medications? Y  Comment daughter takes care of  Managing your Finances? Y  Comment daughter takes care of  Housekeeping or managing your Housekeeping? Y    Patient Care Team: Dale Frederick, MD as PCP - General (Internal Medicine) Benita Gutter, RN as Oncology Nurse Navigator  Indicate any recent Medical Services you may have received from other than [facility] providers in the past year (date may be approximate).     Assessment:   This is a routine wellness examination for [facility].  Hearing/Vision screen Hearing Screening - Comments:: No issues Vision Screening - Comments:: readers  Dietary issues and exercise activities discussed:     Goals Addressed             This Visit's Progress    Patient Stated         Depression Screen    12/01/2022   10:41 AM 05/12/2022   10:36 AM 01/06/2022   11:15 AM 11/28/2021    9:59 AM 11/22/2021    3:36 PM 07/16/2021    2:48 PM 02/05/2021   10:34 AM  PHQ 2/9 Scores  PHQ - 2 Score 1 1 1  0 1 0 0  PHQ- 9 Score 5 13         Fall Risk    12/01/2022   10:36 AM 05/12/2022   10:35 AM 01/06/2022   11:14 AM 11/28/2021   10:11 AM 11/22/2021    3:36 PM  Fall Risk   Falls in the past year? 1 1 1  1   Number falls in past yr: 0 1 1  1   Injury with Fall? 1 1 1  1   Comment injured back      Risk for fall due to : History of fall(s);Impaired balance/gait History of fall(s) Impaired balance/gait  No Fall Risks  Follow up Falls evaluation completed;Education provided;Falls prevention discussed Falls evaluation completed Falls evaluation completed Falls evaluation completed Falls evaluation completed    MEDICARE RISK AT HOME:  Medicare Risk at Home - 12/01/22 1038     Any stairs in or around the home? Yes    If so, are there any without handrails? No     Home free of loose throw rugs in walkways, pet beds, electrical cords, etc? Yes    Adequate lighting in your home to reduce risk of falls? Yes    Life alert? No    Use of a cane, walker or w/c? No    Grab bars in the bathroom? Yes    Shower chair or bench in shower? Yes    Elevated toilet seat or a handicapped toilet? No              Cognitive Function:    10/28/2016   10:38 AM 10/29/2015   10:10 AM  MMSE - Mini Mental State Exam  Orientation to time 5 5  Orientation to Place 5 5  Registration 3 3  Attention/ Calculation 5 5  Recall 3 3  Language- name 2 objects 2 2  Language- repeat 1 1  Language- follow 3 step command 3 3  Language- read & follow direction 1 1  Write a sentence 1 1  Copy design 1 1  Total score 30 30        12/01/2022   10:48 AM 11/28/2021   10:13 AM 11/27/2020    9:44 AM 11/23/2019    9:26 AM  6CIT Screen  What Year? 0 points 0 points 0 points 0 points  What month? 0 points 0 points 0 points 0 points  What time? 0 points 0 points 0 points 0 points  Count back from 20 0 points 0 points    Months in reverse 4 points 0 points  0 points  Repeat phrase 6 points 0 points    Total Score 10 points 0 points      Immunizations Immunization History  Administered Date(s) Administered   Fluad Quad(high Dose 65+) 12/21/2018, 01/09/2020, 02/05/2021, 01/06/2022   Influenza Split 01/18/2012   Influenza, High Dose Seasonal PF 01/31/2016, 01/20/2017, 01/05/2018   Influenza,inj,Quad PF,6+ Mos 01/12/2013, 12/14/2013, 01/09/2015   PFIZER(Purple Top)SARS-COV-2 Vaccination 01/18/2020, 02/08/2020, 10/23/2020   Pneumococcal Conjugate-13 05/17/2013   Pneumococcal Polysaccharide-23 01/29/2015   Tdap 01/17/2013   Zoster Recombinant(Shingrix) 05/31/2020, 07/30/2020   Zoster, Live 03/26/2006    TDAP status: Up to date Due this year , last one 01/17/13  Flu Vaccine status: Up to date  Pneumococcal vaccine status: Up to date  Covid-19 vaccine status: Completed  vaccines  Qualifies for Shingles Vaccine? Yes   Zostavax completed Yes   Shingrix Completed?: Yes  Screening Tests Health Maintenance  Topic Date Due   COVID-19 Vaccine (4 - 2023-24 season) 12/20/2021   OPHTHALMOLOGY EXAM  09/25/2022   Medicare Annual Wellness (AWV)  11/29/2022   INFLUENZA VACCINE  11/20/2022   DTaP/Tdap/Td (2 - Td or Tdap) 01/18/2023   HEMOGLOBIN A1C  02/10/2023   MAMMOGRAM  02/11/2023   FOOT EXAM  08/11/2023   Pneumonia Vaccine 36+ Years old  Completed   DEXA SCAN  Completed   Zoster Vaccines- Shingrix  Completed   HPV VACCINES  Aged Out    Health Maintenance  Health Maintenance Due  Topic Date Due   COVID-19 Vaccine (4 - 2023-24 season) 12/20/2021   OPHTHALMOLOGY EXAM  09/25/2022   Medicare Annual Wellness (  AWV)  11/29/2022   INFLUENZA VACCINE  11/20/2022    Colorectal cancer screening: No longer required.   Mammogram status: Completed  9/23. Repeat every year  Bone Density status: Completed 9/20. Results reflect: Bone density results: OSTEOPENIA. Repeat every 2 years.  Lung Cancer Screening: (Low Dose CT Chest recommended if Age 69-80 years, 20 pack-year currently smoking OR have quit w/in 15years.) does not qualify.   Additional Screening:  Hepatitis C Screening: does not qualify; Completed NA age  Vision Screening: Recommended annual ophthalmology exams for early detection of glaucoma and other disorders of the eye. Is the patient up to date with their annual eye exam?  Yes  Who is the provider or what is the name of the office in which the patient attends annual eye exams? [facility] If pt is not established with a provider, would they like to be referred to a provider to establish care? No .   Dental Screening: Recommended annual dental exams for proper oral hygiene  Diabetic Foot Exam: Diabetic Foot Exam: Completed 4/24  Community Resource Referral / Chronic Care Management: CRR required this visit?  No   CCM required this visit?   No     Plan:     I have personally reviewed and noted the following in the patient's chart:   Medical and social history Use of alcohol, tobacco or illicit drugs  Current medications and supplements including opioid prescriptions. Patient is not currently taking opioid prescriptions. Functional ability and status Nutritional status Physical activity Advanced directives List of other physicians Hospitalizations, surgeries, and ER visits in previous 12 months Vitals Screenings to include cognitive, depression, and falls Referrals and appointments  In addition, I have reviewed and discussed with patient certain preventive protocols, quality metrics, and best practice recommendations. A written personalized care plan for preventive services as well as general preventive health recommendations were provided to patient.     Sydell Axon, LPN   [id number]/78/2956   After Visit Summary: (MyChart) Due to this being a telephonic visit, the after visit summary with patients personalized plan was offered to patient via MyChart   Nurse Notes: None

## 2022-12-01 NOTE — Telephone Encounter (Signed)
She is eating and no nausea/vomiting. She will hold stool softener and do benefiber daily. Will let us know if persistent problems.

## 2022-12-01 NOTE — Patient Instructions (Signed)
Julie Hoover , Thank you for taking time to come for your Medicare Wellness Visit. I appreciate your ongoing commitment to your health goals. Please review the following plan we discussed and let me know if I can assist you in the future.   Referrals/Orders/Follow-Ups/Clinician Recommendations: Mammogram and Dexa Scan  This is a list of the screening recommended for you and due dates:  Health Maintenance  Topic Date Due   COVID-19 Vaccine (4 - 2023-24 season) 12/20/2021   Eye exam for diabetics  09/25/2022   Flu Shot  11/20/2022   DTaP/Tdap/Td vaccine (2 - Td or Tdap) 01/18/2023   Hemoglobin A1C  02/10/2023   Mammogram  02/11/2023   Complete foot exam   08/11/2023   Medicare Annual Wellness Visit  12/01/2023   Pneumonia Vaccine  Completed   DEXA scan (bone density measurement)  Completed   Zoster (Shingles) Vaccine  Completed   HPV Vaccine  Aged Out    Advanced directives: (Copy Requested) Please bring a copy of your health care power of attorney and living will to the office to be added to your chart at your convenience.  Next Medicare Annual Wellness Visit scheduled for next year: Yes 12/02/23 @ 9:30  Preventive Care 65 Years and Older, Female Preventive care refers to lifestyle choices and visits with your health care provider that can promote health and wellness. What does preventive care include? A yearly physical exam. This is also called an annual well check. Dental exams once or twice a year. Routine eye exams. Ask your health care provider how often you should have your eyes checked. Personal lifestyle choices, including: Daily care of your teeth and gums. Regular physical activity. Eating a healthy diet. Avoiding tobacco and drug use. Limiting alcohol use. Practicing safe sex. Taking low-dose aspirin every day. Taking vitamin and mineral supplements as recommended by your health care provider. What happens during an annual well check? The services and screenings  done by your health care provider during your annual well check will depend on your age, overall health, lifestyle risk factors, and family history of disease. Counseling  Your health care provider may ask you questions about your: Alcohol use. Tobacco use. Drug use. Emotional well-being. Home and relationship well-being. Sexual activity. Eating habits. History of falls. Memory and ability to understand (cognition). Work and work Astronomer. Reproductive health. Screening  You may have the following tests or measurements: Height, weight, and BMI. Blood pressure. Lipid and cholesterol levels. These may be checked every 5 years, or more frequently if you are over 19 years old. Skin check. Lung cancer screening. You may have this screening every year starting at age 56 if you have a 30-pack-year history of smoking and currently smoke or have quit within the past 15 years. Fecal occult blood test (FOBT) of the stool. You may have this test every year starting at age 34. Flexible sigmoidoscopy or colonoscopy. You may have a sigmoidoscopy every 5 years or a colonoscopy every 10 years starting at age 61. Hepatitis C blood test. Hepatitis B blood test. Sexually transmitted disease (STD) testing. Diabetes screening. This is done by checking your blood sugar (glucose) after you have not eaten for a while (fasting). You may have this done every 1-3 years. Bone density scan. This is done to screen for osteoporosis. You may have this done starting at age 76. Mammogram. This may be done every 1-2 years. Talk to your health care provider about how often you should have regular mammograms. Talk with your  health care provider about your test results, treatment options, and if necessary, the need for more tests. Vaccines  Your health care provider may recommend certain vaccines, such as: Influenza vaccine. This is recommended every year. Tetanus, diphtheria, and acellular pertussis (Tdap, Td)  vaccine. You may need a Td booster every 10 years. Zoster vaccine. You may need this after age 21. Pneumococcal 13-valent conjugate (PCV13) vaccine. One dose is recommended after age 75. Pneumococcal polysaccharide (PPSV23) vaccine. One dose is recommended after age 62. Talk to your health care provider about which screenings and vaccines you need and how often you need them. This information is not intended to replace advice given to you by your health care provider. Make sure you discuss any questions you have with your health care provider. Document Released: 05/04/2015 Document Revised: 12/26/2015 Document Reviewed: 02/06/2015 Elsevier Interactive Patient Education  2017 [employer].  Fall Prevention in the Home Falls can cause injuries. They can happen to people of all ages. There are many things you can do to make your home safe and to help prevent falls. What can I do on the outside of my home? Regularly fix the edges of walkways and driveways and fix any cracks. Remove anything that might make you trip as you walk through a door, such as a raised step or threshold. Trim any bushes or trees on the path to your home. Use bright outdoor lighting. Clear any walking paths of anything that might make someone trip, such as rocks or tools. Regularly check to see if handrails are loose or broken. Make sure that both sides of any steps have handrails. Any raised decks and porches should have guardrails on the edges. Have any leaves, snow, or ice cleared regularly. Use sand or salt on walking paths during winter. Clean up any spills in your garage right away. This includes oil or grease spills. What can I do in the bathroom? Use night lights. Install grab bars by the toilet and in the tub and shower. Do not use towel bars as grab bars. Use non-skid mats or decals in the tub or shower. If you need to sit down in the shower, use a plastic, non-slip stool. Keep the floor dry. Clean up any  water that spills on the floor as soon as it happens. Remove soap buildup in the tub or shower regularly. Attach bath mats securely with double-sided non-slip rug tape. Do not have throw rugs and other things on the floor that can make you trip. What can I do in the bedroom? Use night lights. Make sure that you have a light by your bed that is easy to reach. Do not use any sheets or blankets that are too big for your bed. They should not hang down onto the floor. Have a firm chair that has side arms. You can use this for support while you get dressed. Do not have throw rugs and other things on the floor that can make you trip. What can I do in the kitchen? Clean up any spills right away. Avoid walking on wet floors. Keep items that you use a lot in easy-to-reach places. If you need to reach something above you, use a strong step stool that has a grab bar. Keep electrical cords out of the way. Do not use floor polish or wax that makes floors slippery. If you must use wax, use non-skid floor wax. Do not have throw rugs and other things on the floor that can make you trip. What  can I do with my stairs? Do not leave any items on the stairs. Make sure that there are handrails on both sides of the stairs and use them. Fix handrails that are broken or loose. Make sure that handrails are as long as the stairways. Check any carpeting to make sure that it is firmly attached to the stairs. Fix any carpet that is loose or worn. Avoid having throw rugs at the top or bottom of the stairs. If you do have throw rugs, attach them to the floor with carpet tape. Make sure that you have a light switch at the top of the stairs and the bottom of the stairs. If you do not have them, ask someone to add them for you. What else can I do to help prevent falls? Wear shoes that: Do not have high heels. Have rubber bottoms. Are comfortable and fit you well. Are closed at the toe. Do not wear sandals. If you use a  stepladder: Make sure that it is fully opened. Do not climb a closed stepladder. Make sure that both sides of the stepladder are locked into place. Ask someone to hold it for you, if possible. Clearly mark and make sure that you can see: Any grab bars or handrails. First and last steps. Where the edge of each step is. Use tools that help you move around (mobility aids) if they are needed. These include: Canes. Walkers. Scooters. Crutches. Turn on the lights when you go into a dark area. Replace any light bulbs as soon as they burn out. Set up your furniture so you have a clear path. Avoid moving your furniture around. If any of your floors are uneven, fix them. If there are any pets around you, be aware of where they are. Review your medicines with your doctor. Some medicines can make you feel dizzy. This can increase your chance of falling. Ask your doctor what other things that you can do to help prevent falls. This information is not intended to replace advice given to you by your health care provider. Make sure you discuss any questions you have with your health care provider. Document Released: 02/01/2009 Document Revised: 09/13/2015 Document Reviewed: 05/12/2014 Elsevier Interactive Patient Education  2017 [employer].

## 2022-12-04 ENCOUNTER — Encounter: Payer: Self-pay | Admitting: Internal Medicine

## 2022-12-04 ENCOUNTER — Other Ambulatory Visit: Payer: Self-pay | Admitting: Internal Medicine

## 2022-12-04 ENCOUNTER — Encounter (INDEPENDENT_AMBULATORY_CARE_PROVIDER_SITE_OTHER): Payer: Self-pay

## 2022-12-04 DIAGNOSIS — E1142 Type 2 diabetes mellitus with diabetic polyneuropathy: Secondary | ICD-10-CM

## 2022-12-28 NOTE — Progress Notes (Signed)
Subjective:    Patient ID: Julie Hoover, female    DOB: Jul 21, 1929, 89 y.o.   MRN: [id number]  Patient here for  Chief Complaint  Patient presents with   Medication Management    HPI Here to follow up regarding hypercholesterolemia, diabetes and hypertension. She is accompanied by her daughter. History obtained from both of them. Had L1 compression fracture. S/p kyphoplasty. Also T12 compression fracture. Is better overall. Taking gabapentin. Marland Kitchen Has been seeing Dr Dalbert Garnet and Dr Sonia Side for f/u - ovarian mass with elevated tumor markers.  Desires no further intervention at this time.  She is scheduled for a f/u CT chest and abd/pelvis in 12/2022. Does report increased stress.  Not able to go out and do things she used to do.  Feels may need something to help. Discussed treatment options.  Some weakness.  Discussed strengthening exercise and PT.  No chest pain or increased sob reported. No cough or congestion.     Past Medical History:  Diagnosis Date   Arthritis    Breast cancer (HCC) 1986   s/p right lumpectomy, XRT and Tamoxifen, mastectomy   Cancer (HCC)    skin ca   Chicken pox    Colon polyps    Diabetes (HCC)    Diverticulosis    Environmental allergies    GERD (gastroesophageal reflux disease)    Glaucoma    Hypercholesterolemia    Hyperglycemia    Hypertension    Hypothyroidism    Osteopenia    Peripheral neuropathy    Personal history of radiation therapy 1986   Urethral stricture    Past Surgical History:  Procedure Laterality Date   BREAST BIOPSY Right 6/86   lumpectomy-positive/rad   BREAST LUMPECTOMY  1986   right   ESOPHAGOGASTRODUODENOSCOPY (EGD) WITH PROPOFOL N/A 12/17/2015   Procedure: ESOPHAGOGASTRODUODENOSCOPY (EGD) WITH PROPOFOL;  Surgeon: Christena Deem, MD;  Location[facility] ENDOSCOPY;  Service: Endoscopy;  Laterality: N/A;   EYE SURGERY     Bilateral cataracts removed   IR KYPHO LUMBAR INC FX REDUCE BONE BX UNI/BIL CANNULATION INC/IMAGING   09/26/2021   IR RADIOLOGIST EVAL & MGMT  10/17/2021   KNEE SURGERY  06/2008   torn meniscus, right   KNEE SURGERY  2012   torn menisucs, right   MASTECTOMY Right 1993   right, recurrence   TUBAL LIGATION     URETHRAL DILATION     Family History  Problem Relation Age of Onset   COPD Mother    Heart disease Father    Hypertension Father    Hypertension Sister    Hypercholesterolemia Sister    Diabetes Mellitus II Sister    Hypertension Brother        x2   Hypercholesterolemia Brother        x2   Cancer Son        Stomach   Cancer Other        mouth, aunt   Colon cancer Neg Hx    Breast cancer Neg Hx    Social History   Socioeconomic History   Marital status: Widowed    Spouse name: Not on file   Number of children: 2   Years of education: Not on file   Highest education level: Not on file  Occupational History   Not on file  Tobacco Use   Smoking status: Never   Smokeless tobacco: Never  Substance and Sexual Activity   Alcohol use: No    Alcohol/week: 0.0 standard drinks of  alcohol   Drug use: No   Sexual activity: Never  Other Topics Concern   Not on file  Social History Narrative   Lives with daughter at [facility]   Social Determinants of Health   Financial Resource Strain: Low Risk  (12/01/2022)   Overall Financial Resource Strain (CARDIA)    Difficulty of Paying Living Expenses: Not hard at all  Food Insecurity: No Food Insecurity (12/01/2022)   Hunger Vital Sign    Worried About Running Out of Food in the Last Year: Never true    Ran Out of Food in the Last Year: Never true  Transportation Needs: No Transportation Needs (12/01/2022)   PRAPARE - Administrator, Civil Service (Medical): No    Lack of Transportation (Non-Medical): No  Physical Activity: Inactive (12/01/2022)   Exercise Vital Sign    Days of Exercise per Week: 0 days    Minutes of Exercise per Session: 0 min  Stress: No Stress Concern Present (12/01/2022)   [employer]  of Occupational Health - Occupational Stress Questionnaire    Feeling of Stress : Not at all  Social Connections: Socially Isolated (12/01/2022)   Social Connection and Isolation Panel [NHANES]    Frequency of Communication with Friends and Family: More than three times a week    Frequency of Social Gatherings with Friends and Family: More than three times a week    Attends Religious Services: Never    Database administrator or Organizations: No    Attends Banker Meetings: Never    Marital Status: Widowed     Review of Systems  Constitutional:  Negative for appetite change and unexpected weight change.  HENT:  Negative for congestion and sinus pressure.   Respiratory:  Negative for cough, chest tightness and shortness of breath.   Cardiovascular:  Negative for chest pain and palpitations.  Gastrointestinal:  Negative for abdominal pain, diarrhea, nausea and vomiting.  Genitourinary:  Negative for difficulty urinating and dysuria.  Musculoskeletal:  Negative for joint swelling and myalgias.  Skin:  Negative for color change and rash.  Neurological:  Negative for dizziness and headaches.  Psychiatric/Behavioral:         Increased stress as outlined.        Objective:     BP 118/72   Pulse (!) 54   Temp (!) 97.5 F (36.4 C) (Oral)   Ht 5\' 4"  (1.626 m)   Wt 130 lb 9.6 oz (59.2 kg)   SpO2 96%   BMI 22.42 kg/m  Wt Readings from Last 3 Encounters:  12/29/22 130 lb 9.6 oz (59.2 kg)  12/01/22 120 lb (54.4 kg)  08/11/22 131 lb 3.2 oz (59.5 kg)    Physical Exam Vitals reviewed.  Constitutional:      General: She is not in acute distress.    Appearance: Normal appearance.  HENT:     Head: Normocephalic and atraumatic.     Right Ear: External ear normal.     Left Ear: External ear normal.  Eyes:     General: No scleral icterus.       Right eye: No discharge.        Left eye: No discharge.     Conjunctiva/sclera: Conjunctivae normal.  Neck:     Thyroid: No  thyromegaly.  Cardiovascular:     Rate and Rhythm: Normal rate and regular rhythm.  Pulmonary:     Effort: No respiratory distress.     Breath sounds: Normal breath sounds.  No wheezing.  Abdominal:     General: Bowel sounds are normal.     Palpations: Abdomen is soft.     Tenderness: There is no abdominal tenderness.  Musculoskeletal:        General: No swelling or tenderness.     Cervical back: Neck supple. No tenderness.  Lymphadenopathy:     Cervical: No cervical adenopathy.  Skin:    Findings: No erythema or rash.  Neurological:     Mental Status: She is alert.  Psychiatric:        Mood and Affect: Mood normal.        Behavior: Behavior normal.      Outpatient Encounter Medications as of 12/29/2022  Medication Sig   [DISCONTINUED] busPIRone (BUSPAR) 5 MG tablet Take 1 tablet (5 mg total) by mouth daily as needed.   ACCU-CHEK AVIVA PLUS test strip TEST BLOOD SUGAR ONE TIME DAILY AS INSTRUCTED   Accu-Chek Softclix Lancets lancets USE AS INSTRUCTED TO CHECK BLOOD SUGAR ONE TIME DAILY   amLODipine (NORVASC) 5 MG tablet TAKE 1 TABLET EVERY DAY   aspirin 81 MG tablet Take 81 mg by mouth daily.   Blood Glucose Monitoring Suppl (ACCU-CHEK AVIVA PLUS) w/Device KIT 1 Device by Does not apply route daily as needed.   busPIRone (BUSPAR) 5 MG tablet Take 1 tablet (5 mg total) by mouth daily as needed.   Calcium Carbonate-Vitamin D 600-400 MG-UNIT chew tablet Chew 1 tablet by mouth 2 (two) times daily.   cetirizine (ZYRTEC) 10 MG tablet Take 10 mg by mouth daily.   dorzolamide-timolol (COSOPT) 22.3-6.8 MG/ML ophthalmic solution Place 1 drop into the left eye 2 (two) times daily.    gabapentin (NEURONTIN) 100 MG capsule Take 1 capsule (100 mg total) by mouth daily.   gabapentin (NEURONTIN) 300 MG capsule TAKE 1 CAPSULE THREE TIMES DAILY   latanoprost (XALATAN) 0.005 % ophthalmic solution Place 1 drop into the left eye at bedtime.   levothyroxine (SYNTHROID) 50 MCG tablet TAKE 1 TABLET  EVERY DAY   rosuvastatin (CRESTOR) 20 MG tablet TAKE 1 TABLET EVERY DAY   temazepam (RESTORIL) 30 MG capsule Take 1 capsule (30 mg total) by mouth at bedtime as needed.   No facility-administered encounter medications on file as of 12/29/2022.     Lab Results  Component Value Date   WBC 9.0 12/29/2022   HGB 13.8 12/29/2022   HCT 42.7 12/29/2022   PLT 229.0 12/29/2022   GLUCOSE 82 12/29/2022   CHOL 162 12/29/2022   TRIG 68.0 12/29/2022   HDL 75.60 12/29/2022   LDLCALC 73 12/29/2022   ALT 12 12/29/2022   AST 18 12/29/2022   NA 139 12/29/2022   K 4.2 12/29/2022   CL 102 12/29/2022   CREATININE 0.90 12/29/2022   BUN 17 12/29/2022   CO2 29 12/29/2022   TSH 1.56 03/31/2022   HGBA1C 6.4 12/29/2022   MICROALBUR <0.7 12/29/2022    CT CHEST ABDOMEN PELVIS WO CONTRAST  Result Date: 06/04/2022 CLINICAL DATA:  Ovarian cyst, elevated CA 125. * Tracking Code: BO * EXAM: CT CHEST, ABDOMEN AND PELVIS WITHOUT CONTRAST TECHNIQUE: Multidetector CT imaging of the chest, abdomen and pelvis was performed following the standard protocol without IV contrast. RADIATION DOSE REDUCTION: This exam was performed according to the departmental dose-optimization program which includes automated exposure control, adjustment of the mA and/or kV according to patient size and/or use of iterative reconstruction technique. COMPARISON:  Multiple priors including MRI thoracic spine October 18, 2021, MRI lumbar spine  Aug 22, 2021 and CT abdomen pelvis Aug 22, 2021. FINDINGS: CT CHEST FINDINGS Cardiovascular: Aortic and branch vessel atherosclerosis. Calcifications of the aortic annulus. Coronary artery calcifications. Normal size heart. No significant pericardial effusion/thickening Mediastinum/Nodes: No suspicious thyroid nodule. No pathologically enlarged mediastinal, hilar or axillary lymph nodes. Moderate-sized hiatal hernia Lungs/Pleura: Biapical pleuroparenchymal scarring. Bilateral bronchial wall thickening with  bronchiectasis/bronchiolectasis and areas of mucoid impaction. Peripheral predominant multifocal clustered and tree-in-bud nodularity. Additional discrete pulmonary nodules for instance in the paramedian left upper lobe measuring 9 mm on image 45/4 and in the right middle lobe measuring 11 mm on image 106/4. Progression of the the above findings in the lung bases in comparison to CT Aug 22, 2021. Musculoskeletal: Diffuse demineralization of bone. No aggressive lytic or blastic lesion of bone. Similar anterior compression deformity at T12. Prior right mastectomy and right axillary lymph node dissection. CT ABDOMEN PELVIS FINDINGS Hepatobiliary: Unremarkable noncontrast enhanced appearance of the hepatic parenchyma. Gallbladder is unremarkable. No biliary ductal dilation. Pancreas: No pancreatic ductal dilation or evidence of acute inflammation. Spleen: No splenomegaly. Adrenals/Urinary Tract: Bilateral adrenal glands appear normal. Hydronephrosis. No renal, ureteral or bladder calculi. Urinary bladder is unremarkable for degree of distension. Stomach/Bowel: Moderate-sized hiatal hernia. No pathologic dilation of small or large bowel. Extensive left-sided colonic diverticulosis with wall thickening of the sigmoid colon without adjacent mesenteric inflammatory stranding. Vascular/Lymphatic: Aortic atherosclerosis. Normal caliber abdominal aorta. No pathologically enlarged abdominal or pelvic lymph nodes. Reproductive: Increased size of the right ovarian cyst which demonstrates a mural calcification on image 98/2 and now measures 2.5 cm previously 2.2 cm no suspicious left ovarian mass. Atrophic uterus. Other: No walled off fluid collection. No significant abdominopelvic free fluid. No pneumoperitoneum. No discrete peritoneal or omental nodularity. Musculoskeletal: No aggressive lytic or blastic lesion of bone. Prior L1 vertebral body augmentation. IMPRESSION: 1. Increased size of the right ovarian cyst which  demonstrates a mural calcification and now measures 2.5 cm previously 2.2 cm. Given elevation of CA 125 and slowly increasing size of this ovarian lesion it is suspicious for a cystic ovarian neoplasm. 2. Extensive left-sided colonic diverticulosis with wall thickening of the sigmoid colon without adjacent mesenteric inflammatory stranding, possibly reflecting sequela of chronic diverticulitis. Consider further evaluation with colonoscopy to exclude underlying mass lesion. 3. Bilateral bronchial wall thickening with bronchiectasis/bronchiolectasis and areas of mucoid impaction with peripheral predominant multifocal clustered and tree-in-bud nodularity with pulmonary nodules measuring up to 11 mm, nonspecific but most compatible with a chronic atypical infection such as MAI. Suggest short-term interval follow-up dedicated chest CT . 4. Moderate-sized hiatal hernia. 5.  Aortic Atherosclerosis (ICD10-I70.0). Electronically Signed   By: Maudry Mayhew M.D.   On: 06/04/2022 11:03       Assessment & Plan:  Type 2 diabetes mellitus with diabetic polyneuropathy, without long-term current use of insulin (HCC) Assessment & Plan:  Follow met b and a1c.  Lab Results  Component Value Date   HGBA1C 6.4 12/29/2022     Orders: -     Hemoglobin A1c -     Microalbumin / creatinine urine ratio  Primary hypertension Assessment & Plan: Continue amlodipine. Follow pressures.  Follow metabolic panel.   Orders: -     Basic metabolic panel  Hypercholesterolemia Assessment & Plan: Continue crestor.  Low cholesterol diet and exercise.  Follow lipid panel and liver function tests.    Orders: -     CBC with Differential/Platelet -     Hepatic function panel -     Lipid panel  Stress Assessment & Plan: Has good support.  Staying with daughter now.  Discussed.  Feels may need something to help level things out.  Buspar as directed.  Follow.    Other polyneuropathy Assessment & Plan: On gabapentin.  Continue  current dose. Stable.   Orders: -     Ambulatory referral to Home Health  Cyst of right ovary Assessment & Plan: Saw Dr Sonia Side 06/30/22 - Ms Firestone not interested in surgery.  Plan pelvic ultrasound and CT chest/abd - as outlined.  Scheduled 12/29/22.    Hypothyroidism, unspecified type Assessment & Plan: On thyroid replacement.  Follow tsh.     History of breast cancer Assessment & Plan: 02/10/22 - [facility] I (left mammogram) s/p right mastectomy.    Gastroesophageal reflux disease with esophagitis without hemorrhage Assessment & Plan: No acid reflux reported.  On protonix. Follow.    Compression fracture of L1 vertebra with routine healing, subsequent encounter Assessment & Plan: S/p kyphoplasty.  F/u revealed a new T12 compression fracture as noted previously.  Has seen ortho as outlined previously.  Continue gabapentin as outlined.  Follow.  Orders: -     Ambulatory referral to Home Health  Aortic atherosclerosis [facility]) Assessment & Plan: Continue crestor.    Other orders -     busPIRone HCl; Take 1 tablet (5 mg total) by mouth daily as needed.  Dispense: 30 tablet; Refill: 2     Dale Gadsden, MD

## 2022-12-29 ENCOUNTER — Ambulatory Visit
Admission: RE | Admit: 2022-12-29 | Discharge: 2022-12-29 | Disposition: A | Discharge status: Home / Self Care | Payer: Medicare HMO | Source: Ambulatory Visit | Attending: Obstetrics and Gynecology | Admitting: Obstetrics and Gynecology

## 2022-12-29 ENCOUNTER — Encounter: Payer: Self-pay | Admitting: Internal Medicine

## 2022-12-29 ENCOUNTER — Ambulatory Visit (INDEPENDENT_AMBULATORY_CARE_PROVIDER_SITE_OTHER): Payer: Medicare HMO | Admitting: Internal Medicine

## 2022-12-29 ENCOUNTER — Other Ambulatory Visit: Payer: Self-pay | Admitting: Obstetrics and Gynecology

## 2022-12-29 VITALS — BP 118/72 | HR 54 | Temp 97.5°F | Ht 64.0 in | Wt 130.6 lb

## 2022-12-29 DIAGNOSIS — E039 Hypothyroidism, unspecified: Secondary | ICD-10-CM | POA: Diagnosis not present

## 2022-12-29 DIAGNOSIS — G6289 Other specified polyneuropathies: Secondary | ICD-10-CM | POA: Diagnosis not present

## 2022-12-29 DIAGNOSIS — C569 Malignant neoplasm of unspecified ovary: Secondary | ICD-10-CM | POA: Diagnosis not present

## 2022-12-29 DIAGNOSIS — K2100 Gastro-esophageal reflux disease with esophagitis, without bleeding: Secondary | ICD-10-CM | POA: Diagnosis not present

## 2022-12-29 DIAGNOSIS — N838 Other noninflammatory disorders of ovary, fallopian tube and broad ligament: Secondary | ICD-10-CM | POA: Diagnosis not present

## 2022-12-29 DIAGNOSIS — N83291 Other ovarian cyst, right side: Secondary | ICD-10-CM | POA: Diagnosis not present

## 2022-12-29 DIAGNOSIS — F439 Reaction to severe stress, unspecified: Secondary | ICD-10-CM | POA: Diagnosis not present

## 2022-12-29 DIAGNOSIS — N83201 Unspecified ovarian cyst, right side: Secondary | ICD-10-CM | POA: Diagnosis not present

## 2022-12-29 DIAGNOSIS — J479 Bronchiectasis, uncomplicated: Secondary | ICD-10-CM | POA: Diagnosis not present

## 2022-12-29 DIAGNOSIS — N9489 Other specified conditions associated with female genital organs and menstrual cycle: Secondary | ICD-10-CM

## 2022-12-29 DIAGNOSIS — I700 Atherosclerosis of aorta: Secondary | ICD-10-CM | POA: Diagnosis not present

## 2022-12-29 DIAGNOSIS — E7800 Pure hypercholesterolemia, unspecified: Secondary | ICD-10-CM

## 2022-12-29 DIAGNOSIS — R918 Other nonspecific abnormal finding of lung field: Secondary | ICD-10-CM | POA: Diagnosis not present

## 2022-12-29 DIAGNOSIS — I10 Essential (primary) hypertension: Secondary | ICD-10-CM | POA: Diagnosis not present

## 2022-12-29 DIAGNOSIS — Z853 Personal history of malignant neoplasm of breast: Secondary | ICD-10-CM | POA: Diagnosis not present

## 2022-12-29 DIAGNOSIS — S32010D Wedge compression fracture of first lumbar vertebra, subsequent encounter for fracture with routine healing: Secondary | ICD-10-CM

## 2022-12-29 DIAGNOSIS — E1142 Type 2 diabetes mellitus with diabetic polyneuropathy: Secondary | ICD-10-CM | POA: Diagnosis not present

## 2022-12-29 DIAGNOSIS — R9389 Abnormal findings on diagnostic imaging of other specified body structures: Secondary | ICD-10-CM | POA: Diagnosis not present

## 2022-12-29 LAB — BASIC METABOLIC PANEL
BUN: 17 mg/dL (ref 6–23)
CO2: 29 meq/L (ref 19–32)
Calcium: 10 mg/dL (ref 8.4–10.5)
Chloride: 102 meq/L (ref 96–112)
Creatinine, Ser: 0.9 mg/dL (ref 0.40–1.20)
GFR: 56 mL/min — ABNORMAL LOW (ref >60.00)
Glucose, Bld: 82 mg/dL (ref 70–99)
Potassium: 4.2 meq/L (ref 3.5–5.1)
Sodium: 139 meq/L (ref 135–145)

## 2022-12-29 LAB — CBC WITH DIFFERENTIAL/PLATELET
Basophils Absolute: 0 10*3/uL (ref 0.0–0.1)
Basophils Relative: 0.4 % (ref 0.0–3.0)
Eosinophils Absolute: 0 10*3/uL (ref 0.0–0.7)
Eosinophils Relative: 0.6 % (ref 0.0–5.0)
HCT: 42.7 % (ref 36.0–46.0)
Hemoglobin: 13.8 g/dL (ref 12.0–15.0)
Lymphocytes Relative: 26.7 % (ref 12.0–46.0)
Lymphs Abs: 2.4 10*3/uL (ref 0.7–4.0)
MCHC: 32.3 g/dL (ref 30.0–36.0)
MCV: 94.3 fl (ref 78.0–100.0)
Monocytes Absolute: 0.6 10*3/uL (ref 0.1–1.0)
Monocytes Relative: 7.2 % (ref 3.0–12.0)
Neutro Abs: 5.8 10*3/uL (ref 1.4–7.7)
Neutrophils Relative %: 65.1 % (ref 43.0–77.0)
Platelets: 229 10*3/uL (ref 150.0–400.0)
RBC: 4.53 Mil/uL (ref 3.87–5.11)
RDW: 13.8 % (ref 11.5–15.5)
WBC: 9 10*3/uL (ref 4.0–10.5)

## 2022-12-29 LAB — LIPID PANEL
Cholesterol: 162 mg/dL (ref 0–200)
HDL: 75.6 mg/dL (ref >39.00)
LDL Cholesterol: 73 mg/dL (ref 0–99)
NonHDL: 86.54
Total CHOL/HDL Ratio: 2
Triglycerides: 68 mg/dL (ref 0.0–149.0)
VLDL: 13.6 mg/dL (ref 0.0–40.0)

## 2022-12-29 LAB — HEMOGLOBIN A1C: Hgb A1c MFr Bld: 6.4 % (ref 4.6–6.5)

## 2022-12-29 LAB — MICROALBUMIN / CREATININE URINE RATIO
Creatinine,U: 25.7 mg/dL
Microalb Creat Ratio: 2.7 mg/g (ref 0.0–30.0)
Microalb, Ur: <0.7 mg/dL (ref 0.0–1.9)

## 2022-12-29 LAB — HEPATIC FUNCTION PANEL
ALT: 12 U/L (ref 0–35)
AST: 18 U/L (ref 0–37)
Albumin: 4.4 g/dL (ref 3.5–5.2)
Alkaline Phosphatase: 62 U/L (ref 39–117)
Bilirubin, Direct: 0.1 mg/dL (ref 0.0–0.3)
Total Bilirubin: 0.7 mg/dL (ref 0.2–1.2)
Total Protein: 8.1 g/dL (ref 6.0–8.3)

## 2022-12-29 MED ORDER — BUSPIRONE HCL 5 MG PO TABS: 5.0000 mg | ORAL_TABLET | Freq: Every day | ORAL | 2 refills | Status: AC | PRN

## 2022-12-29 MED ORDER — BUSPIRONE HCL 5 MG PO TABS: 5.0000 mg | ORAL_TABLET | Freq: Every day | ORAL | 2 refills | Status: DC | PRN

## 2023-01-04 ENCOUNTER — Encounter: Payer: Self-pay | Admitting: Internal Medicine

## 2023-01-04 NOTE — Assessment & Plan Note (Signed)
02/10/22 - Birads I (left mammogram) s/p right mastectomy.

## 2023-01-04 NOTE — Assessment & Plan Note (Signed)
Has good support.  Staying with daughter now.  Discussed.  Feels may need something to help level things out.  Buspar as directed.  Follow.

## 2023-01-04 NOTE — Assessment & Plan Note (Signed)
S/p kyphoplasty.  F/u revealed a new T12 compression fracture as noted previously.  Has seen ortho as outlined previously.  Continue gabapentin as outlined.  Follow.

## 2023-01-04 NOTE — Assessment & Plan Note (Signed)
Continue crestor

## 2023-01-04 NOTE — Assessment & Plan Note (Signed)
Follow met b and a1c.  Lab Results  Component Value Date   HGBA1C 6.4 12/29/2022

## 2023-01-04 NOTE — Assessment & Plan Note (Signed)
No acid reflux reported.  On protonix. Follow.

## 2023-01-04 NOTE — Assessment & Plan Note (Signed)
Continue amlodipine.  Follow pressures.  Follow metabolic panel.   

## 2023-01-04 NOTE — Assessment & Plan Note (Signed)
Saw Dr Sonia Side 06/30/22 - Ms Eberhard not interested in surgery.  Plan pelvic ultrasound and CT chest/abd - as outlined.  Scheduled 12/29/22.

## 2023-01-04 NOTE — Assessment & Plan Note (Signed)
On thyroid replacement.  Follow tsh.  

## 2023-01-04 NOTE — Assessment & Plan Note (Signed)
On gabapentin.  Continue current dose. Stable.

## 2023-01-04 NOTE — Assessment & Plan Note (Signed)
Continue crestor.  Low cholesterol diet and exercise.  Follow lipid panel and liver function tests.  

## 2023-01-05 ENCOUNTER — Telehealth: Payer: Self-pay

## 2023-01-05 NOTE — Telephone Encounter (Signed)
Called and spoke with daughter, Julie Hoover regarding appointment this week with Dr. Sonia Side. Julie Hoover is living in [place] full time now and would like Dr. Sonia Side to review her imaging for any changes prior to coming driving in for a visit. She has requested appointment for this week be cancelled pending radiology report. Dr. Sonia Side will be in clinic 9/25 and will review radiology and advise.

## 2023-01-07 ENCOUNTER — Inpatient Hospital Stay: Payer: Medicare HMO

## 2023-01-07 ENCOUNTER — Other Ambulatory Visit: Payer: Self-pay | Admitting: Internal Medicine

## 2023-01-07 ENCOUNTER — Ambulatory Visit: Payer: Medicare HMO

## 2023-01-15 ENCOUNTER — Telehealth: Payer: Self-pay

## 2023-01-15 NOTE — Telephone Encounter (Signed)
Called and left voicemail with daughter, Misty Stanley to return call regarding recommendation from Dr. Sonia Side.

## 2023-01-30 ENCOUNTER — Encounter: Payer: Self-pay | Admitting: Internal Medicine

## 2023-01-30 DIAGNOSIS — Z1231 Encounter for screening mammogram for malignant neoplasm of breast: Secondary | ICD-10-CM

## 2023-01-30 DIAGNOSIS — E2839 Other primary ovarian failure: Secondary | ICD-10-CM

## 2023-02-02 NOTE — Telephone Encounter (Signed)
MAMMO AND DEXA ORDERS PRINTED FOR SIGNATURE

## 2023-02-02 NOTE — Telephone Encounter (Signed)
Signed and placed in box.

## 2023-02-10 ENCOUNTER — Other Ambulatory Visit: Payer: Self-pay

## 2023-02-10 DIAGNOSIS — N9489 Other specified conditions associated with female genital organs and menstrual cycle: Secondary | ICD-10-CM

## 2023-02-11 ENCOUNTER — Other Ambulatory Visit: Payer: Self-pay | Admitting: Internal Medicine

## 2023-02-11 DIAGNOSIS — E1142 Type 2 diabetes mellitus with diabetic polyneuropathy: Secondary | ICD-10-CM

## 2023-03-02 ENCOUNTER — Encounter: Payer: Self-pay | Admitting: Internal Medicine

## 2023-03-02 DIAGNOSIS — Z4431 Encounter for fitting and adjustment of external right breast prosthesis: Secondary | ICD-10-CM | POA: Diagnosis not present

## 2023-03-02 DIAGNOSIS — Z9011 Acquired absence of right breast and nipple: Secondary | ICD-10-CM | POA: Diagnosis not present

## 2023-03-02 DIAGNOSIS — C50111 Malignant neoplasm of central portion of right female breast: Secondary | ICD-10-CM | POA: Diagnosis not present

## 2023-03-02 NOTE — Telephone Encounter (Signed)
Ok for prescription 

## 2023-03-03 NOTE — Telephone Encounter (Signed)
Signed and placed in box.   

## 2023-03-03 NOTE — Telephone Encounter (Signed)
Rx printed for your signature.

## 2023-03-09 DIAGNOSIS — Z1231 Encounter for screening mammogram for malignant neoplasm of breast: Secondary | ICD-10-CM | POA: Diagnosis not present

## 2023-03-09 DIAGNOSIS — M8589 Other specified disorders of bone density and structure, multiple sites: Secondary | ICD-10-CM | POA: Diagnosis not present

## 2023-03-09 DIAGNOSIS — E2839 Other primary ovarian failure: Secondary | ICD-10-CM | POA: Diagnosis not present

## 2023-03-09 DIAGNOSIS — Z853 Personal history of malignant neoplasm of breast: Secondary | ICD-10-CM | POA: Diagnosis not present

## 2023-03-09 LAB — HM MAMMOGRAPHY

## 2023-03-09 LAB — HM DEXA SCAN

## 2023-03-09 NOTE — Telephone Encounter (Signed)
[facility]  called and said they need Clinical notes faxed over to them from the provider. She said it was faxed over to [place]. Fax number is [phone].

## 2023-03-09 NOTE — Telephone Encounter (Signed)
Chart notes faxed via efax

## 2023-03-10 NOTE — Telephone Encounter (Signed)
Error

## 2023-03-12 ENCOUNTER — Encounter: Payer: Self-pay | Admitting: Internal Medicine

## 2023-04-07 DIAGNOSIS — H401233 Low-tension glaucoma, bilateral, severe stage: Secondary | ICD-10-CM | POA: Diagnosis not present

## 2023-04-07 DIAGNOSIS — Z0100 Encounter for examination of eyes and vision without abnormal findings: Secondary | ICD-10-CM | POA: Diagnosis not present

## 2023-04-07 DIAGNOSIS — Z961 Presence of intraocular lens: Secondary | ICD-10-CM | POA: Diagnosis not present

## 2023-04-07 LAB — HM DIABETES EYE EXAM

## 2023-05-04 ENCOUNTER — Ambulatory Visit: Payer: Medicare HMO | Admitting: Internal Medicine

## 2023-05-19 DIAGNOSIS — N83201 Unspecified ovarian cyst, right side: Secondary | ICD-10-CM | POA: Diagnosis not present

## 2023-06-23 ENCOUNTER — Ambulatory Visit: Payer: Medicare HMO | Admitting: Internal Medicine

## 2023-06-23 ENCOUNTER — Encounter: Payer: Self-pay | Admitting: Internal Medicine

## 2023-06-23 VITALS — BP 122/68 | HR 78 | Temp 98.0°F | Resp 16 | Ht 64.0 in | Wt 128.0 lb

## 2023-06-23 DIAGNOSIS — E039 Hypothyroidism, unspecified: Secondary | ICD-10-CM | POA: Diagnosis not present

## 2023-06-23 DIAGNOSIS — E7800 Pure hypercholesterolemia, unspecified: Secondary | ICD-10-CM | POA: Diagnosis not present

## 2023-06-23 DIAGNOSIS — N83201 Unspecified ovarian cyst, right side: Secondary | ICD-10-CM

## 2023-06-23 DIAGNOSIS — Z853 Personal history of malignant neoplasm of breast: Secondary | ICD-10-CM

## 2023-06-23 DIAGNOSIS — E1142 Type 2 diabetes mellitus with diabetic polyneuropathy: Secondary | ICD-10-CM | POA: Diagnosis not present

## 2023-06-23 DIAGNOSIS — G6289 Other specified polyneuropathies: Secondary | ICD-10-CM | POA: Diagnosis not present

## 2023-06-23 DIAGNOSIS — I10 Essential (primary) hypertension: Secondary | ICD-10-CM

## 2023-06-23 DIAGNOSIS — K2100 Gastro-esophageal reflux disease with esophagitis, without bleeding: Secondary | ICD-10-CM

## 2023-06-23 DIAGNOSIS — F439 Reaction to severe stress, unspecified: Secondary | ICD-10-CM | POA: Diagnosis not present

## 2023-06-23 DIAGNOSIS — I700 Atherosclerosis of aorta: Secondary | ICD-10-CM

## 2023-06-23 LAB — HEMOGLOBIN A1C: Hgb A1c MFr Bld: 6.5 % (ref 4.6–6.5)

## 2023-06-23 NOTE — Progress Notes (Signed)
 Subjective:    Patient ID: Julie Hoover, female    DOB: [birth year], 89 y.o.   MRN: [id number]  Patient here for  Chief Complaint  Patient presents with   Medical Management of Chronic Issues    HPI Here for a scheduled follow up - follow up regarding hypercholesterolemia, diabetes and hypertension. She is accompanied by her daughter. History obtained from both of them. Had f/u Dr Dalbert Garnet 05/19/23 - f/u right ovarian cyst. Ultrasound "benign appearing" simple cyst. Recommended f/u in 2 years. Last visit, prescribed buspar to help with increased stress. Not taking regularly. Discussed increased stress. She is looking into moving into an assisted living facility. No chest pain or sob reported. Discussed the need to use her walker to prevent falls, etc. Eating. No abdominal pain reported.    Past Medical History:  Diagnosis Date   Arthritis    Breast cancer (HCC) 1986   s/p right lumpectomy, XRT and Tamoxifen, mastectomy   Cancer (HCC)    skin ca   Chicken pox    Colon polyps    Diabetes (HCC)    Diverticulosis    Environmental allergies    GERD (gastroesophageal reflux disease)    Glaucoma    Hypercholesterolemia    Hyperglycemia    Hypertension    Hypothyroidism    Osteopenia    Peripheral neuropathy    Personal history of radiation therapy 1986   Urethral stricture    Past Surgical History:  Procedure Laterality Date   BREAST BIOPSY Right 6/86   lumpectomy-positive/rad   BREAST LUMPECTOMY  1986   right   ESOPHAGOGASTRODUODENOSCOPY (EGD) WITH PROPOFOL N/A 12/17/2015   Procedure: ESOPHAGOGASTRODUODENOSCOPY (EGD) WITH PROPOFOL;  Surgeon: Christena Deem, MD;  Location: [facility] ENDOSCOPY;  Service: Endoscopy;  Laterality: N/A;   EYE SURGERY     Bilateral cataracts removed   IR KYPHO LUMBAR INC FX REDUCE BONE BX UNI/BIL CANNULATION INC/IMAGING  09/26/2021   IR RADIOLOGIST EVAL & MGMT  10/17/2021   KNEE SURGERY  06/2008   torn meniscus, right   KNEE SURGERY  2012    torn menisucs, right   MASTECTOMY Right 1993   right, recurrence   TUBAL LIGATION     URETHRAL DILATION     Family History  Problem Relation Age of Onset   COPD Mother    Heart disease Father    Hypertension Father    Hypertension Sister    Hypercholesterolemia Sister    Diabetes Mellitus II Sister    Hypertension Brother        x2   Hypercholesterolemia Brother        x2   Cancer Son        Stomach   Cancer Other        mouth, aunt   Colon cancer Neg Hx    Breast cancer Neg Hx    Social History   Socioeconomic History   Marital status: Widowed    Spouse name: Not on file   Number of children: 2   Years of education: Not on file   Highest education level: Not on file  Occupational History   Not on file  Tobacco Use   Smoking status: Never   Smokeless tobacco: Never  Substance and Sexual Activity   Alcohol use: No    Alcohol/week: 0.0 standard drinks of alcohol   Drug use: No   Sexual activity: Never  Other Topics Concern   Not on file  Social History Narrative   Lives with  daughter at [facility]   Social Drivers of Health   Financial Resource Strain: Low Risk  (12/01/2022)   Overall Financial Resource Strain (CARDIA)    Difficulty of Paying Living Expenses: Not hard at all  Food Insecurity: No Food Insecurity (12/01/2022)   Hunger Vital Sign    Worried About Running Out of Food in the Last Year: Never true    Ran Out of Food in the Last Year: Never true  Transportation Needs: No Transportation Needs (12/01/2022)   PRAPARE - Administrator, Civil Service (Medical): No    Lack of Transportation (Non-Medical): No  Physical Activity: Inactive (12/01/2022)   Exercise Vital Sign    Days of Exercise per Week: 0 days    Minutes of Exercise per Session: 0 min  Stress: No Stress Concern Present (12/01/2022)   [employer] of Occupational Health - Occupational Stress Questionnaire    Feeling of Stress : Not at all  Social Connections: Unknown  (02/04/2023)   Received from [facility]   Social Network    Social Network: Not on file  Recent Concern: Social Connections - Socially Isolated (12/01/2022)   Social Connection and Isolation Panel [NHANES]    Frequency of Communication with Friends and Family: More than three times a week    Frequency of Social Gatherings with Friends and Family: More than three times a week    Attends Religious Services: Never    Database administrator or Organizations: No    Attends Banker Meetings: Never    Marital Status: Widowed     Review of Systems  Constitutional:  Negative for appetite change and unexpected weight change.  HENT:  Negative for congestion and sinus pressure.   Respiratory:  Negative for cough and chest tightness.        Breathing stable.   Cardiovascular:  Negative for chest pain, palpitations and leg swelling.  Gastrointestinal:  Negative for abdominal pain, diarrhea, nausea and vomiting.  Genitourinary:  Negative for difficulty urinating and dysuria.  Musculoskeletal:  Negative for joint swelling and myalgias.  Skin:  Negative for color change and rash.  Neurological:  Negative for dizziness and headaches.  Psychiatric/Behavioral:  Negative for agitation and dysphoric mood.        Objective:     BP 122/68   Pulse 78   Temp 98 F (36.7 C)   Resp 16   Ht 5\' 4"  (1.626 m)   Wt 128 lb (58.1 kg)   SpO2 98%   BMI 21.97 kg/m  Wt Readings from Last 3 Encounters:  06/23/23 128 lb (58.1 kg)  12/29/22 130 lb 9.6 oz (59.2 kg)  12/01/22 120 lb (54.4 kg)    Physical Exam Vitals reviewed.  Constitutional:      General: She is not in acute distress.    Appearance: Normal appearance.  HENT:     Head: Normocephalic and atraumatic.     Right Ear: External ear normal.     Left Ear: External ear normal.     Mouth/Throat:     Pharynx: No oropharyngeal exudate or posterior oropharyngeal erythema.  Eyes:     General: No scleral icterus.       Right eye:  No discharge.        Left eye: No discharge.     Conjunctiva/sclera: Conjunctivae normal.  Neck:     Thyroid: No thyromegaly.  Cardiovascular:     Rate and Rhythm: Normal rate and regular rhythm.  Pulmonary:  Effort: No respiratory distress.     Breath sounds: Normal breath sounds. No wheezing.  Abdominal:     General: Bowel sounds are normal.     Palpations: Abdomen is soft.     Tenderness: There is no abdominal tenderness.  Musculoskeletal:        General: No swelling or tenderness.     Cervical back: Neck supple. No tenderness.  Lymphadenopathy:     Cervical: No cervical adenopathy.  Skin:    Findings: No erythema or rash.  Neurological:     Mental Status: She is alert.  Psychiatric:        Mood and Affect: Mood normal.        Behavior: Behavior normal.         Outpatient Encounter Medications as of 06/23/2023  Medication Sig   ACCU-CHEK AVIVA PLUS test strip TEST BLOOD SUGAR ONE TIME DAILY AS INSTRUCTED   Accu-Chek Softclix Lancets lancets USE AS INSTRUCTED TO CHECK BLOOD SUGAR ONE TIME DAILY   amLODipine (NORVASC) 5 MG tablet TAKE 1 TABLET EVERY DAY   aspirin 81 MG tablet Take 81 mg by mouth daily.   Blood Glucose Monitoring Suppl (ACCU-CHEK AVIVA PLUS) w/Device KIT 1 Device by Does not apply route daily as needed.   busPIRone (BUSPAR) 5 MG tablet Take 1 tablet (5 mg total) by mouth daily as needed.   Calcium Carbonate-Vitamin D 600-400 MG-UNIT chew tablet Chew 1 tablet by mouth 2 (two) times daily.   cetirizine (ZYRTEC) 10 MG tablet Take 10 mg by mouth daily.   dorzolamide-timolol (COSOPT) 22.3-6.8 MG/ML ophthalmic solution Place 1 drop into the left eye 2 (two) times daily.    gabapentin (NEURONTIN) 100 MG capsule Take 1 capsule (100 mg total) by mouth daily.   gabapentin (NEURONTIN) 300 MG capsule TAKE 1 CAPSULE THREE TIMES DAILY   latanoprost (XALATAN) 0.005 % ophthalmic solution Place 1 drop into the left eye at bedtime.   levothyroxine (SYNTHROID) 50 MCG  tablet TAKE 1 TABLET EVERY DAY   rosuvastatin (CRESTOR) 20 MG tablet TAKE 1 TABLET EVERY DAY   temazepam (RESTORIL) 30 MG capsule Take 1 capsule (30 mg total) by mouth at bedtime as needed.   No facility-administered encounter medications on file as of 06/23/2023.     Lab Results  Component Value Date   WBC 9.0 12/29/2022   HGB 13.8 12/29/2022   HCT 42.7 12/29/2022   PLT 229.0 12/29/2022   GLUCOSE 97 06/23/2023   CHOL 156 06/23/2023   TRIG 69.0 06/23/2023   HDL 68.70 06/23/2023   LDLCALC 74 06/23/2023   ALT 15 06/23/2023   AST 21 06/23/2023   NA 140 06/23/2023   K 4.4 06/23/2023   CL 103 06/23/2023   CREATININE 0.95 06/23/2023   BUN 18 06/23/2023   CO2 29 06/23/2023   TSH 4.07 06/23/2023   HGBA1C 6.5 06/23/2023   MICROALBUR <0.7 12/29/2022    CT Chest Wo Contrast Result Date: 01/09/2023 CLINICAL DATA:  Follow-up pulmonary nodules EXAM: CT CHEST WITHOUT CONTRAST TECHNIQUE: Multidetector CT imaging of the chest was performed following the standard protocol without IV contrast. RADIATION DOSE REDUCTION: This exam was performed according to the departmental dose-optimization program which includes automated exposure control, adjustment of the mA and/or kV according to patient size and/or use of iterative reconstruction technique. COMPARISON:  CT chest, abdomen and pelvis dated June 04, 2022 FINDINGS: Cardiovascular: Normal heart size. No pericardial effusion. Normal caliber thoracic aorta with moderate atherosclerotic disease. Severe coronary artery calcifications. Mediastinum/Nodes: Large  hiatal hernia. Thyroid is unremarkable. Surgical clips of the right axilla. No enlarged lymph nodes seen in the chest. Lungs/Pleura: Central airways are patent. Bilateral bronchiectasis with scattered areas of mucous plugging and pulmonary nodules. Several previously seen nodules have resolved, although there are new solid nodules on today's exam. Largest new solid nodule measures 11 x 11 mm in the  right middle lobe on series 4, image 103. Upper Abdomen: No acute abnormality. Musculoskeletal: Prior right mastectomy. Similar anterior compression deformity at T12 with kyphoplasty changes. No aggressive appearing osseous lesions. IMPRESSION: 1. Bilateral bronchiectasis with scattered areas of mucous plugging and pulmonary nodules. 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 Recommend continued follow-up based on patient risk factors. 2. Aortic Atherosclerosis (ICD10-I70.0). Electronically Signed   By: Allegra Lai M.D.   On: 01/09/2023 08:55   US PELVIS (TRANSABDOMINAL ONLY) Result Date: 01/08/2023 CLINICAL DATA:  Follow-up for adnexal mass. EXAM: TRANSABDOMINAL ULTRASOUND OF PELVIS TECHNIQUE: Transabdominal ultrasound examination of the pelvis was performed including evaluation of the uterus, ovaries, adnexal regions, and pelvic cul-de-sac. COMPARISON:  CT, 06/04/2022. FINDINGS: Uterus Measurements: 6.6 x 1.7 x 2.8 cm = volume: 16.7 mL. No fibroids or other mass visualized. Endometrium Thickness: 4 mm. Fluid distends the mid to upper endometrial canal by 4 mm, nonspecific. No endometrial mass or focal lesion. Right ovary Measurements: 3.9 x 2.0 x 2.8 cm = volume: 6.9 mL. Simple appearing cyst arises from the right ovary measuring 3.2 x 1.9 x 2.9 cm. Ovary otherwise unremarkable. No adnexal masses. Left ovary Measurements: 1.7 x 1.0 x 1.2 cm = volume: 1.1 mL. Normal appearance/no adnexal mass. Other findings: No abnormal free fluid. Area of focal shadowing and heterogeneous echogenicity in the right adnexa is presumably bowel. No mass evident in this location on the recent prior CT. IMPRESSION: 1. No acute findings. 2. Small amount of endometrial canal fluid, nonspecific. No endometrial  thickening or focal lesion. 3. Right ovarian cyst measuring 3.2 cm. Recommend followup US in 3-6 months. Note: This recommendation does not apply to premenarchal patients or to those with increased risk (genetic, family history, elevated tumor markers or other high-risk factors) of ovarian cancer. Reference: Radiology 2019 Nov; 293(2):359-371. 4. Focal area of abnormal echogenicity in the right adnexa likely due to bowel. However, if there are pelvic symptoms or concern for a right adnexal mass, follow-up pelvic CT with contrast would be recommended. Electronically Signed   By: Amie Portland M.D.   On: 01/08/2023 09:56       Assessment & Plan:  Type 2 diabetes mellitus with diabetic polyneuropathy, without long-term current use of insulin (HCC) Assessment & Plan: Reviewed outside sugar readings. Overall sugar doing well. Will follow met b and A1c.  Lab Results  Component Value Date   HGBA1C 6.5 06/23/2023     Orders: -     Hemoglobin A1c  Hypothyroidism, unspecified type Assessment & Plan: On thyroid replacement. Follow tsh.   Orders: -     TSH  Primary hypertension Assessment & Plan: Continue amlodipine. No changes in medication. Follow pressures. Check metabolic panel.   Orders: -     Basic metabolic panel  Hypercholesterolemia Assessment & Plan: Continue crestor. Low cholesterol diet and exercise. Follow lipid panel and liver function tests.   Orders: -     Hepatic function panel -  Lipid panel  Stress Assessment & Plan: Increased stress. Discussed. Living with her daughter. She feels is causing increased work on her daughter. Looking into an assisted living facility. Has buspar. Rarely takes restoril to help with sleep. Follow.    Other polyneuropathy Assessment & Plan: Continue gabapentin. Follow.    Cyst of right ovary Assessment & Plan:  Had f/u Dr Dalbert Garnet 05/19/23 - f/u right ovarian cyst. Ultrasound "benign appearing" simple cyst. Recommended f/u in 2  years.    History of breast cancer Assessment & Plan: Mammogram - left - 03/09/23 - Birads I.    Gastroesophageal reflux disease with esophagitis without hemorrhage Assessment & Plan: Continues on protonix. No upper symptoms reported.    Aortic atherosclerosis (HCC) Assessment & Plan: Continue crestor.       Dale [place], MD

## 2023-06-24 LAB — HEPATIC FUNCTION PANEL
ALT: 15 U/L (ref 0–35)
AST: 21 U/L (ref 0–37)
Albumin: 4.4 g/dL (ref 3.5–5.2)
Alkaline Phosphatase: 63 U/L (ref 39–117)
Bilirubin, Direct: 0.1 mg/dL (ref 0.0–0.3)
Total Bilirubin: 0.7 mg/dL (ref 0.2–1.2)
Total Protein: 8.1 g/dL (ref 6.0–8.3)

## 2023-06-24 LAB — LIPID PANEL
Cholesterol: 156 mg/dL (ref 0–200)
HDL: 68.7 mg/dL (ref >39.00)
LDL Cholesterol: 74 mg/dL (ref 0–99)
NonHDL: 87.49
Total CHOL/HDL Ratio: 2
Triglycerides: 69 mg/dL (ref 0.0–149.0)
VLDL: 13.8 mg/dL (ref 0.0–40.0)

## 2023-06-24 LAB — BASIC METABOLIC PANEL
BUN: 18 mg/dL (ref 6–23)
CO2: 29 meq/L (ref 19–32)
Calcium: 10.3 mg/dL (ref 8.4–10.5)
Chloride: 103 meq/L (ref 96–112)
Creatinine, Ser: 0.95 mg/dL (ref 0.40–1.20)
GFR: 52.3 mL/min — ABNORMAL LOW (ref >60.00)
Glucose, Bld: 97 mg/dL (ref 70–99)
Potassium: 4.4 meq/L (ref 3.5–5.1)
Sodium: 140 meq/L (ref 135–145)

## 2023-06-24 LAB — TSH: TSH: 4.07 u[IU]/mL (ref 0.35–5.50)

## 2023-06-28 ENCOUNTER — Encounter: Payer: Self-pay | Admitting: Internal Medicine

## 2023-06-28 NOTE — Assessment & Plan Note (Signed)
 Continue gabapentin. Follow.

## 2023-06-28 NOTE — Assessment & Plan Note (Signed)
 Reviewed outside sugar readings. Overall sugar doing well. Will follow met b and A1c.  Lab Results  Component Value Date   HGBA1C 6.5 06/23/2023

## 2023-06-28 NOTE — Assessment & Plan Note (Signed)
 Continues on protonix. No upper symptoms reported.

## 2023-06-28 NOTE — Assessment & Plan Note (Signed)
 Had f/u Dr Dalbert Garnet 05/19/23 - f/u right ovarian cyst. Ultrasound "benign appearing" simple cyst. Recommended f/u in 2 years.

## 2023-06-28 NOTE — Assessment & Plan Note (Signed)
 Continue crestor.  Low cholesterol diet and exercise.  Follow lipid panel and liver function tests.

## 2023-06-28 NOTE — Assessment & Plan Note (Signed)
 Continue crestor

## 2023-06-28 NOTE — Assessment & Plan Note (Signed)
 Mammogram - left - 03/09/23 - Birads I.

## 2023-06-28 NOTE — Assessment & Plan Note (Signed)
 On thyroid replacement.  Follow tsh.

## 2023-06-28 NOTE — Assessment & Plan Note (Signed)
 Continue amlodipine. No changes in medication. Follow pressures. Check metabolic panel.

## 2023-06-28 NOTE — Assessment & Plan Note (Signed)
 Increased stress. Discussed. Living with her daughter. She feels is causing increased work on her daughter. Looking into an assisted living facility. Has buspar. Rarely takes restoril to help with sleep. Follow.

## 2023-07-02 ENCOUNTER — Other Ambulatory Visit: Payer: Self-pay | Admitting: Internal Medicine

## 2023-07-13 ENCOUNTER — Other Ambulatory Visit: Payer: Self-pay

## 2023-07-13 MED ORDER — GABAPENTIN 300 MG PO CAPS: 300.0000 mg | ORAL_CAPSULE | Freq: Three times a day (TID) | ORAL | 3 refills | Status: AC

## 2023-08-11 NOTE — Telephone Encounter (Signed)
 FL2 and diet order placed out for signature.

## 2023-08-11 NOTE — Telephone Encounter (Signed)
 Signed and placed in box.

## 2023-08-17 ENCOUNTER — Other Ambulatory Visit: Payer: Self-pay | Admitting: Internal Medicine

## 2023-08-19 NOTE — Telephone Encounter (Signed)
 Received a request for temazepam  refill. Per last note, was rarely taking. Has not been refilled since 03/2022. Is taking gabapentin  now. Please confirm if still needs temazepam  rx.  Thanks.

## 2023-08-19 NOTE — Telephone Encounter (Signed)
 Lvm for pt to give office a call need to confirm if medication is needed

## 2023-09-07 DIAGNOSIS — L821 Other seborrheic keratosis: Secondary | ICD-10-CM | POA: Diagnosis not present

## 2023-09-07 DIAGNOSIS — L570 Actinic keratosis: Secondary | ICD-10-CM | POA: Diagnosis not present

## 2023-09-08 ENCOUNTER — Telehealth: Payer: Self-pay

## 2023-09-08 NOTE — Telephone Encounter (Signed)
 Copied from CRM [id number]. Topic: Clinical - Medication Question >> Sep 08, 2023  1:43 PM Dewanda Foots wrote: Reason for CRM: Jasmine from Assisted living states that daughter is concerned about this medication because the chewable is so big that she has trouble swallowing it and is requesting that we look into a chewable gummy if at all possible.   Would like this to be sent to  [facility]  Address: [address] [place], [place] [address] Open Closes 6 PM Phone: [phone]  Please call daughter Edwina Gram) back at [phone]

## 2023-09-09 NOTE — Telephone Encounter (Signed)
 Copied from CRM [id number]. Topic: General - Other >> Sep 09, 2023  3:06 PM Caliyah H wrote: Reason for CRM: Patient daughter returning missed call from the nurse.

## 2023-09-09 NOTE — Telephone Encounter (Signed)
 Calcium  are too big so Edwina Gram the daughter states her mother used to take the soft chew calcium  but the Assisted Living states that Dr. Geralyn Knee will have to specify soft chew calcium  on the order. Edwina Gram states patient is having trouble swallowing the calcium  pills.

## 2023-09-09 NOTE — Telephone Encounter (Signed)
 Please call and notify - assisted living - ok for calcium  soft chew - 600mg  plus vitamin D3 bid. Can stop current calcium  and start the soft chew.

## 2023-09-09 NOTE — Telephone Encounter (Signed)
LM for daughter

## 2023-09-09 NOTE — Telephone Encounter (Signed)
 Please call and confirm what medication she is referring to

## 2023-09-10 MED ORDER — CALTRATE 600+D PLUS MINERALS 600-800 MG-UNIT PO CHEW: CHEWABLE_TABLET | ORAL | 11 refills | Status: AC

## 2023-09-10 NOTE — Telephone Encounter (Signed)
 Discussed with Dr Geralyn Knee and sent rx for caltrate chews to pharmacy with note to d/c calcium  tablets

## 2023-09-10 NOTE — Addendum Note (Signed)
 Addended by: Victorino Grates D on: 09/10/2023 02:18 PM   Modules accepted: Orders

## 2023-09-14 ENCOUNTER — Encounter: Payer: Self-pay | Admitting: Internal Medicine

## 2023-09-15 NOTE — Telephone Encounter (Signed)
 Patient needs rx sent to [facility] in [place]. Rx pended to correct pharmacy.

## 2023-09-16 MED ORDER — TEMAZEPAM 30 MG PO CAPS: 30.0000 mg | ORAL_CAPSULE | Freq: Every evening | ORAL | 0 refills | Status: AC | PRN

## 2023-09-16 NOTE — Telephone Encounter (Signed)
 Rx for temazepam  sent in to pharmacy. It appears she rarely takes. She is now on gabapentin . Need to let us  know if need increases or any problems.

## 2023-09-18 ENCOUNTER — Telehealth: Payer: Self-pay | Admitting: Internal Medicine

## 2023-09-18 NOTE — Telephone Encounter (Signed)
 Lm and sent MyChart note. Dr Geralyn Knee will not be in the office on July 14th. Please call and reschedule your appointment. E2C2 please reschedule appointment.

## 2023-09-22 ENCOUNTER — Telehealth: Payer: Self-pay

## 2023-09-22 NOTE — Telephone Encounter (Signed)
 Copied from CRM [id number]. Topic: Clinical - Medication Question >> Sep 22, 2023  1:27 PM Allyne Areola wrote: Reason for CRM: Jasmine from [facility] is calling in regards to requesting a PRN order from DR.Scott for patient to be able to use IcyHot patches 4% lidocaine  1% menthol. Best call back number is [phone] and fax number [phone].

## 2023-09-23 NOTE — Telephone Encounter (Signed)
 Ok - lidocaine  patch - daily prn

## 2023-09-23 NOTE — Telephone Encounter (Signed)
 Ok to give order for lidocaine  patch daily prn for back pain?

## 2023-09-23 NOTE — Telephone Encounter (Signed)
 LM with receptionist to clarify what they are requesting ( PRN order for patch to use where, how often, etc.) can transfer call to me.

## 2023-09-24 NOTE — Telephone Encounter (Signed)
 Signed and placed in box.

## 2023-09-24 NOTE — Telephone Encounter (Signed)
 Order faxed.

## 2023-09-24 NOTE — Telephone Encounter (Signed)
Order placed out for signature

## 2023-10-01 DIAGNOSIS — M8588 Other specified disorders of bone density and structure, other site: Secondary | ICD-10-CM | POA: Diagnosis not present

## 2023-10-01 DIAGNOSIS — E039 Hypothyroidism, unspecified: Secondary | ICD-10-CM | POA: Diagnosis not present

## 2023-10-01 DIAGNOSIS — E7800 Pure hypercholesterolemia, unspecified: Secondary | ICD-10-CM | POA: Diagnosis not present

## 2023-10-01 DIAGNOSIS — E559 Vitamin D deficiency, unspecified: Secondary | ICD-10-CM | POA: Diagnosis not present

## 2023-10-01 DIAGNOSIS — E1142 Type 2 diabetes mellitus with diabetic polyneuropathy: Secondary | ICD-10-CM | POA: Diagnosis not present

## 2023-10-01 DIAGNOSIS — K5909 Other constipation: Secondary | ICD-10-CM | POA: Diagnosis not present

## 2023-10-01 DIAGNOSIS — Z79899 Other long term (current) drug therapy: Secondary | ICD-10-CM | POA: Diagnosis not present

## 2023-10-01 DIAGNOSIS — M545 Low back pain, unspecified: Secondary | ICD-10-CM | POA: Diagnosis not present

## 2023-10-01 DIAGNOSIS — I10 Essential (primary) hypertension: Secondary | ICD-10-CM | POA: Diagnosis not present

## 2023-10-12 ENCOUNTER — Telehealth: Payer: Self-pay

## 2023-10-12 NOTE — Telephone Encounter (Signed)
 Spoke with daughter, Julie Hoover, regarding ovarian cyst. It is time for one year evaluation. Julie Hoover saw Dr. Verdon in January and they have an observation plan. She is aware she can call us  for any needs in the future.

## 2023-10-12 NOTE — Telephone Encounter (Signed)
 error

## 2023-10-13 DIAGNOSIS — E7800 Pure hypercholesterolemia, unspecified: Secondary | ICD-10-CM | POA: Diagnosis not present

## 2023-10-13 DIAGNOSIS — E039 Hypothyroidism, unspecified: Secondary | ICD-10-CM | POA: Diagnosis not present

## 2023-10-13 DIAGNOSIS — E1142 Type 2 diabetes mellitus with diabetic polyneuropathy: Secondary | ICD-10-CM | POA: Diagnosis not present

## 2023-10-13 DIAGNOSIS — E559 Vitamin D deficiency, unspecified: Secondary | ICD-10-CM | POA: Diagnosis not present

## 2023-10-15 DIAGNOSIS — Z79899 Other long term (current) drug therapy: Secondary | ICD-10-CM | POA: Diagnosis not present

## 2023-10-15 DIAGNOSIS — I10 Essential (primary) hypertension: Secondary | ICD-10-CM | POA: Diagnosis not present

## 2023-10-15 DIAGNOSIS — E1142 Type 2 diabetes mellitus with diabetic polyneuropathy: Secondary | ICD-10-CM | POA: Diagnosis not present

## 2023-10-15 DIAGNOSIS — E039 Hypothyroidism, unspecified: Secondary | ICD-10-CM | POA: Diagnosis not present

## 2023-10-15 DIAGNOSIS — K5909 Other constipation: Secondary | ICD-10-CM | POA: Diagnosis not present

## 2023-10-28 DIAGNOSIS — S0181XA Laceration without foreign body of other part of head, initial encounter: Secondary | ICD-10-CM | POA: Diagnosis not present

## 2023-10-28 DIAGNOSIS — Y998 Other external cause status: Secondary | ICD-10-CM | POA: Diagnosis not present

## 2023-10-28 DIAGNOSIS — W0110XA Fall on same level from slipping, tripping and stumbling with subsequent striking against unspecified object, initial encounter: Secondary | ICD-10-CM | POA: Diagnosis not present

## 2023-10-28 DIAGNOSIS — Y92129 Unspecified place in nursing home as the place of occurrence of the external cause: Secondary | ICD-10-CM | POA: Diagnosis not present

## 2023-10-28 DIAGNOSIS — S61412A Laceration without foreign body of left hand, initial encounter: Secondary | ICD-10-CM | POA: Diagnosis not present

## 2023-10-28 DIAGNOSIS — R58 Hemorrhage, not elsewhere classified: Secondary | ICD-10-CM | POA: Diagnosis not present

## 2023-10-28 DIAGNOSIS — Y9389 Activity, other specified: Secondary | ICD-10-CM | POA: Diagnosis not present

## 2023-10-28 DIAGNOSIS — W19XXXA Unspecified fall, initial encounter: Secondary | ICD-10-CM | POA: Diagnosis not present

## 2023-10-29 DIAGNOSIS — R2689 Other abnormalities of gait and mobility: Secondary | ICD-10-CM | POA: Diagnosis not present

## 2023-10-29 DIAGNOSIS — Z4800 Encounter for change or removal of nonsurgical wound dressing: Secondary | ICD-10-CM | POA: Diagnosis not present

## 2023-10-29 DIAGNOSIS — S61412A Laceration without foreign body of left hand, initial encounter: Secondary | ICD-10-CM | POA: Diagnosis not present

## 2023-10-29 DIAGNOSIS — K5909 Other constipation: Secondary | ICD-10-CM | POA: Diagnosis not present

## 2023-10-29 DIAGNOSIS — S0181XA Laceration without foreign body of other part of head, initial encounter: Secondary | ICD-10-CM | POA: Diagnosis not present

## 2023-11-01 DIAGNOSIS — S0181XD Laceration without foreign body of other part of head, subsequent encounter: Secondary | ICD-10-CM | POA: Diagnosis not present

## 2023-11-01 DIAGNOSIS — M6289 Other specified disorders of muscle: Secondary | ICD-10-CM | POA: Diagnosis not present

## 2023-11-01 DIAGNOSIS — Z9181 History of falling: Secondary | ICD-10-CM | POA: Diagnosis not present

## 2023-11-01 DIAGNOSIS — Z604 Social exclusion and rejection: Secondary | ICD-10-CM | POA: Diagnosis not present

## 2023-11-01 DIAGNOSIS — I10 Essential (primary) hypertension: Secondary | ICD-10-CM | POA: Diagnosis not present

## 2023-11-01 DIAGNOSIS — E119 Type 2 diabetes mellitus without complications: Secondary | ICD-10-CM | POA: Diagnosis not present

## 2023-11-01 DIAGNOSIS — R32 Unspecified urinary incontinence: Secondary | ICD-10-CM | POA: Diagnosis not present

## 2023-11-01 DIAGNOSIS — Z556 Problems related to health literacy: Secondary | ICD-10-CM | POA: Diagnosis not present

## 2023-11-01 DIAGNOSIS — S61412D Laceration without foreign body of left hand, subsequent encounter: Secondary | ICD-10-CM | POA: Diagnosis not present

## 2023-11-01 DIAGNOSIS — I999 Unspecified disorder of circulatory system: Secondary | ICD-10-CM | POA: Diagnosis not present

## 2023-11-02 ENCOUNTER — Ambulatory Visit: Admitting: Internal Medicine

## 2023-11-03 DIAGNOSIS — Z556 Problems related to health literacy: Secondary | ICD-10-CM | POA: Diagnosis not present

## 2023-11-03 DIAGNOSIS — Z9181 History of falling: Secondary | ICD-10-CM | POA: Diagnosis not present

## 2023-11-03 DIAGNOSIS — S0181XD Laceration without foreign body of other part of head, subsequent encounter: Secondary | ICD-10-CM | POA: Diagnosis not present

## 2023-11-03 DIAGNOSIS — E119 Type 2 diabetes mellitus without complications: Secondary | ICD-10-CM | POA: Diagnosis not present

## 2023-11-03 DIAGNOSIS — Z604 Social exclusion and rejection: Secondary | ICD-10-CM | POA: Diagnosis not present

## 2023-11-03 DIAGNOSIS — R32 Unspecified urinary incontinence: Secondary | ICD-10-CM | POA: Diagnosis not present

## 2023-11-03 DIAGNOSIS — I999 Unspecified disorder of circulatory system: Secondary | ICD-10-CM | POA: Diagnosis not present

## 2023-11-03 DIAGNOSIS — S61412D Laceration without foreign body of left hand, subsequent encounter: Secondary | ICD-10-CM | POA: Diagnosis not present

## 2023-11-03 DIAGNOSIS — I10 Essential (primary) hypertension: Secondary | ICD-10-CM | POA: Diagnosis not present

## 2023-11-05 DIAGNOSIS — I10 Essential (primary) hypertension: Secondary | ICD-10-CM | POA: Diagnosis not present

## 2023-11-05 DIAGNOSIS — Z604 Social exclusion and rejection: Secondary | ICD-10-CM | POA: Diagnosis not present

## 2023-11-05 DIAGNOSIS — R32 Unspecified urinary incontinence: Secondary | ICD-10-CM | POA: Diagnosis not present

## 2023-11-05 DIAGNOSIS — S0181XD Laceration without foreign body of other part of head, subsequent encounter: Secondary | ICD-10-CM | POA: Diagnosis not present

## 2023-11-05 DIAGNOSIS — I999 Unspecified disorder of circulatory system: Secondary | ICD-10-CM | POA: Diagnosis not present

## 2023-11-05 DIAGNOSIS — Z556 Problems related to health literacy: Secondary | ICD-10-CM | POA: Diagnosis not present

## 2023-11-05 DIAGNOSIS — S61412D Laceration without foreign body of left hand, subsequent encounter: Secondary | ICD-10-CM | POA: Diagnosis not present

## 2023-11-05 DIAGNOSIS — Z9181 History of falling: Secondary | ICD-10-CM | POA: Diagnosis not present

## 2023-11-05 DIAGNOSIS — E119 Type 2 diabetes mellitus without complications: Secondary | ICD-10-CM | POA: Diagnosis not present

## 2023-11-09 DIAGNOSIS — Z604 Social exclusion and rejection: Secondary | ICD-10-CM | POA: Diagnosis not present

## 2023-11-09 DIAGNOSIS — Z9181 History of falling: Secondary | ICD-10-CM | POA: Diagnosis not present

## 2023-11-09 DIAGNOSIS — S0181XD Laceration without foreign body of other part of head, subsequent encounter: Secondary | ICD-10-CM | POA: Diagnosis not present

## 2023-11-09 DIAGNOSIS — Z556 Problems related to health literacy: Secondary | ICD-10-CM | POA: Diagnosis not present

## 2023-11-09 DIAGNOSIS — I999 Unspecified disorder of circulatory system: Secondary | ICD-10-CM | POA: Diagnosis not present

## 2023-11-09 DIAGNOSIS — S61412D Laceration without foreign body of left hand, subsequent encounter: Secondary | ICD-10-CM | POA: Diagnosis not present

## 2023-11-09 DIAGNOSIS — E119 Type 2 diabetes mellitus without complications: Secondary | ICD-10-CM | POA: Diagnosis not present

## 2023-11-09 DIAGNOSIS — R32 Unspecified urinary incontinence: Secondary | ICD-10-CM | POA: Diagnosis not present

## 2023-11-09 DIAGNOSIS — I10 Essential (primary) hypertension: Secondary | ICD-10-CM | POA: Diagnosis not present

## 2023-11-10 DIAGNOSIS — E119 Type 2 diabetes mellitus without complications: Secondary | ICD-10-CM | POA: Diagnosis not present

## 2023-11-10 DIAGNOSIS — S61412D Laceration without foreign body of left hand, subsequent encounter: Secondary | ICD-10-CM | POA: Diagnosis not present

## 2023-11-10 DIAGNOSIS — R32 Unspecified urinary incontinence: Secondary | ICD-10-CM | POA: Diagnosis not present

## 2023-11-10 DIAGNOSIS — Z9181 History of falling: Secondary | ICD-10-CM | POA: Diagnosis not present

## 2023-11-10 DIAGNOSIS — I10 Essential (primary) hypertension: Secondary | ICD-10-CM | POA: Diagnosis not present

## 2023-11-10 DIAGNOSIS — I999 Unspecified disorder of circulatory system: Secondary | ICD-10-CM | POA: Diagnosis not present

## 2023-11-10 DIAGNOSIS — Z556 Problems related to health literacy: Secondary | ICD-10-CM | POA: Diagnosis not present

## 2023-11-10 DIAGNOSIS — S0181XD Laceration without foreign body of other part of head, subsequent encounter: Secondary | ICD-10-CM | POA: Diagnosis not present

## 2023-11-10 DIAGNOSIS — Z604 Social exclusion and rejection: Secondary | ICD-10-CM | POA: Diagnosis not present

## 2023-11-12 DIAGNOSIS — Z9181 History of falling: Secondary | ICD-10-CM | POA: Diagnosis not present

## 2023-11-12 DIAGNOSIS — R32 Unspecified urinary incontinence: Secondary | ICD-10-CM | POA: Diagnosis not present

## 2023-11-12 DIAGNOSIS — Z604 Social exclusion and rejection: Secondary | ICD-10-CM | POA: Diagnosis not present

## 2023-11-12 DIAGNOSIS — F5101 Primary insomnia: Secondary | ICD-10-CM | POA: Diagnosis not present

## 2023-11-12 DIAGNOSIS — E119 Type 2 diabetes mellitus without complications: Secondary | ICD-10-CM | POA: Diagnosis not present

## 2023-11-12 DIAGNOSIS — G4700 Insomnia, unspecified: Secondary | ICD-10-CM | POA: Diagnosis not present

## 2023-11-12 DIAGNOSIS — G8929 Other chronic pain: Secondary | ICD-10-CM | POA: Diagnosis not present

## 2023-11-12 DIAGNOSIS — S0181XD Laceration without foreign body of other part of head, subsequent encounter: Secondary | ICD-10-CM | POA: Diagnosis not present

## 2023-11-12 DIAGNOSIS — I999 Unspecified disorder of circulatory system: Secondary | ICD-10-CM | POA: Diagnosis not present

## 2023-11-12 DIAGNOSIS — I10 Essential (primary) hypertension: Secondary | ICD-10-CM | POA: Diagnosis not present

## 2023-11-12 DIAGNOSIS — Z556 Problems related to health literacy: Secondary | ICD-10-CM | POA: Diagnosis not present

## 2023-11-12 DIAGNOSIS — S61412D Laceration without foreign body of left hand, subsequent encounter: Secondary | ICD-10-CM | POA: Diagnosis not present

## 2023-11-16 DIAGNOSIS — Z604 Social exclusion and rejection: Secondary | ICD-10-CM | POA: Diagnosis not present

## 2023-11-16 DIAGNOSIS — I999 Unspecified disorder of circulatory system: Secondary | ICD-10-CM | POA: Diagnosis not present

## 2023-11-16 DIAGNOSIS — R32 Unspecified urinary incontinence: Secondary | ICD-10-CM | POA: Diagnosis not present

## 2023-11-16 DIAGNOSIS — S61412D Laceration without foreign body of left hand, subsequent encounter: Secondary | ICD-10-CM | POA: Diagnosis not present

## 2023-11-16 DIAGNOSIS — I10 Essential (primary) hypertension: Secondary | ICD-10-CM | POA: Diagnosis not present

## 2023-11-16 DIAGNOSIS — Z9181 History of falling: Secondary | ICD-10-CM | POA: Diagnosis not present

## 2023-11-16 DIAGNOSIS — Z556 Problems related to health literacy: Secondary | ICD-10-CM | POA: Diagnosis not present

## 2023-11-16 DIAGNOSIS — S0181XD Laceration without foreign body of other part of head, subsequent encounter: Secondary | ICD-10-CM | POA: Diagnosis not present

## 2023-11-16 DIAGNOSIS — E119 Type 2 diabetes mellitus without complications: Secondary | ICD-10-CM | POA: Diagnosis not present

## 2023-11-17 DIAGNOSIS — R32 Unspecified urinary incontinence: Secondary | ICD-10-CM | POA: Diagnosis not present

## 2023-11-17 DIAGNOSIS — Z556 Problems related to health literacy: Secondary | ICD-10-CM | POA: Diagnosis not present

## 2023-11-17 DIAGNOSIS — I999 Unspecified disorder of circulatory system: Secondary | ICD-10-CM | POA: Diagnosis not present

## 2023-11-17 DIAGNOSIS — E119 Type 2 diabetes mellitus without complications: Secondary | ICD-10-CM | POA: Diagnosis not present

## 2023-11-17 DIAGNOSIS — I10 Essential (primary) hypertension: Secondary | ICD-10-CM | POA: Diagnosis not present

## 2023-11-17 DIAGNOSIS — S61412D Laceration without foreign body of left hand, subsequent encounter: Secondary | ICD-10-CM | POA: Diagnosis not present

## 2023-11-17 DIAGNOSIS — Z604 Social exclusion and rejection: Secondary | ICD-10-CM | POA: Diagnosis not present

## 2023-11-17 DIAGNOSIS — Z9181 History of falling: Secondary | ICD-10-CM | POA: Diagnosis not present

## 2023-11-17 DIAGNOSIS — S0181XD Laceration without foreign body of other part of head, subsequent encounter: Secondary | ICD-10-CM | POA: Diagnosis not present

## 2023-11-19 DIAGNOSIS — B3731 Acute candidiasis of vulva and vagina: Secondary | ICD-10-CM | POA: Diagnosis not present

## 2023-11-19 DIAGNOSIS — S0181XD Laceration without foreign body of other part of head, subsequent encounter: Secondary | ICD-10-CM | POA: Diagnosis not present

## 2023-11-19 DIAGNOSIS — R32 Unspecified urinary incontinence: Secondary | ICD-10-CM | POA: Diagnosis not present

## 2023-11-19 DIAGNOSIS — Z9181 History of falling: Secondary | ICD-10-CM | POA: Diagnosis not present

## 2023-11-19 DIAGNOSIS — E119 Type 2 diabetes mellitus without complications: Secondary | ICD-10-CM | POA: Diagnosis not present

## 2023-11-19 DIAGNOSIS — Z604 Social exclusion and rejection: Secondary | ICD-10-CM | POA: Diagnosis not present

## 2023-11-19 DIAGNOSIS — I999 Unspecified disorder of circulatory system: Secondary | ICD-10-CM | POA: Diagnosis not present

## 2023-11-19 DIAGNOSIS — I10 Essential (primary) hypertension: Secondary | ICD-10-CM | POA: Diagnosis not present

## 2023-11-19 DIAGNOSIS — Z556 Problems related to health literacy: Secondary | ICD-10-CM | POA: Diagnosis not present

## 2023-11-19 DIAGNOSIS — S61412D Laceration without foreign body of left hand, subsequent encounter: Secondary | ICD-10-CM | POA: Diagnosis not present

## 2023-11-23 DIAGNOSIS — Z9181 History of falling: Secondary | ICD-10-CM | POA: Diagnosis not present

## 2023-11-23 DIAGNOSIS — R32 Unspecified urinary incontinence: Secondary | ICD-10-CM | POA: Diagnosis not present

## 2023-11-23 DIAGNOSIS — I10 Essential (primary) hypertension: Secondary | ICD-10-CM | POA: Diagnosis not present

## 2023-11-23 DIAGNOSIS — Z556 Problems related to health literacy: Secondary | ICD-10-CM | POA: Diagnosis not present

## 2023-11-23 DIAGNOSIS — S0181XD Laceration without foreign body of other part of head, subsequent encounter: Secondary | ICD-10-CM | POA: Diagnosis not present

## 2023-11-23 DIAGNOSIS — I999 Unspecified disorder of circulatory system: Secondary | ICD-10-CM | POA: Diagnosis not present

## 2023-11-23 DIAGNOSIS — Z604 Social exclusion and rejection: Secondary | ICD-10-CM | POA: Diagnosis not present

## 2023-11-23 DIAGNOSIS — E119 Type 2 diabetes mellitus without complications: Secondary | ICD-10-CM | POA: Diagnosis not present

## 2023-11-23 DIAGNOSIS — S61412D Laceration without foreign body of left hand, subsequent encounter: Secondary | ICD-10-CM | POA: Diagnosis not present

## 2023-11-25 ENCOUNTER — Telehealth: Payer: Self-pay | Admitting: *Deleted

## 2023-11-25 NOTE — Telephone Encounter (Signed)
 Received a from from [facility] regarding a rash under right arm & requesting an itch cream on 11/18/23.  I spoke with daughter who confirmed that Julie Hoover is now seeing a PCP in the senior facility.  I have also cancelled her AWV at daughters request and will fax form back to facility with this information noted.

## 2023-11-26 DIAGNOSIS — Z9181 History of falling: Secondary | ICD-10-CM | POA: Diagnosis not present

## 2023-11-26 DIAGNOSIS — I10 Essential (primary) hypertension: Secondary | ICD-10-CM | POA: Diagnosis not present

## 2023-11-26 DIAGNOSIS — I999 Unspecified disorder of circulatory system: Secondary | ICD-10-CM | POA: Diagnosis not present

## 2023-11-26 DIAGNOSIS — S61412D Laceration without foreign body of left hand, subsequent encounter: Secondary | ICD-10-CM | POA: Diagnosis not present

## 2023-11-26 DIAGNOSIS — S0181XD Laceration without foreign body of other part of head, subsequent encounter: Secondary | ICD-10-CM | POA: Diagnosis not present

## 2023-11-26 DIAGNOSIS — R32 Unspecified urinary incontinence: Secondary | ICD-10-CM | POA: Diagnosis not present

## 2023-11-26 DIAGNOSIS — Z604 Social exclusion and rejection: Secondary | ICD-10-CM | POA: Diagnosis not present

## 2023-11-26 DIAGNOSIS — Z556 Problems related to health literacy: Secondary | ICD-10-CM | POA: Diagnosis not present

## 2023-11-26 DIAGNOSIS — E119 Type 2 diabetes mellitus without complications: Secondary | ICD-10-CM | POA: Diagnosis not present

## 2023-11-28 DIAGNOSIS — Z556 Problems related to health literacy: Secondary | ICD-10-CM | POA: Diagnosis not present

## 2023-11-28 DIAGNOSIS — S61412D Laceration without foreign body of left hand, subsequent encounter: Secondary | ICD-10-CM | POA: Diagnosis not present

## 2023-11-28 DIAGNOSIS — S0181XD Laceration without foreign body of other part of head, subsequent encounter: Secondary | ICD-10-CM | POA: Diagnosis not present

## 2023-11-28 DIAGNOSIS — Z9181 History of falling: Secondary | ICD-10-CM | POA: Diagnosis not present

## 2023-11-28 DIAGNOSIS — E119 Type 2 diabetes mellitus without complications: Secondary | ICD-10-CM | POA: Diagnosis not present

## 2023-11-28 DIAGNOSIS — R32 Unspecified urinary incontinence: Secondary | ICD-10-CM | POA: Diagnosis not present

## 2023-11-28 DIAGNOSIS — Z604 Social exclusion and rejection: Secondary | ICD-10-CM | POA: Diagnosis not present

## 2023-11-28 DIAGNOSIS — I999 Unspecified disorder of circulatory system: Secondary | ICD-10-CM | POA: Diagnosis not present

## 2023-11-28 DIAGNOSIS — I10 Essential (primary) hypertension: Secondary | ICD-10-CM | POA: Diagnosis not present

## 2023-11-30 DIAGNOSIS — S0181XD Laceration without foreign body of other part of head, subsequent encounter: Secondary | ICD-10-CM | POA: Diagnosis not present

## 2023-11-30 DIAGNOSIS — I10 Essential (primary) hypertension: Secondary | ICD-10-CM | POA: Diagnosis not present

## 2023-11-30 DIAGNOSIS — Z9181 History of falling: Secondary | ICD-10-CM | POA: Diagnosis not present

## 2023-11-30 DIAGNOSIS — R32 Unspecified urinary incontinence: Secondary | ICD-10-CM | POA: Diagnosis not present

## 2023-11-30 DIAGNOSIS — S61412D Laceration without foreign body of left hand, subsequent encounter: Secondary | ICD-10-CM | POA: Diagnosis not present

## 2023-11-30 DIAGNOSIS — Z604 Social exclusion and rejection: Secondary | ICD-10-CM | POA: Diagnosis not present

## 2023-11-30 DIAGNOSIS — I999 Unspecified disorder of circulatory system: Secondary | ICD-10-CM | POA: Diagnosis not present

## 2023-11-30 DIAGNOSIS — E119 Type 2 diabetes mellitus without complications: Secondary | ICD-10-CM | POA: Diagnosis not present

## 2023-11-30 DIAGNOSIS — Z556 Problems related to health literacy: Secondary | ICD-10-CM | POA: Diagnosis not present

## 2023-12-01 DIAGNOSIS — I999 Unspecified disorder of circulatory system: Secondary | ICD-10-CM | POA: Diagnosis not present

## 2023-12-01 DIAGNOSIS — Z556 Problems related to health literacy: Secondary | ICD-10-CM | POA: Diagnosis not present

## 2023-12-01 DIAGNOSIS — Z9181 History of falling: Secondary | ICD-10-CM | POA: Diagnosis not present

## 2023-12-01 DIAGNOSIS — S61412D Laceration without foreign body of left hand, subsequent encounter: Secondary | ICD-10-CM | POA: Diagnosis not present

## 2023-12-01 DIAGNOSIS — S0181XD Laceration without foreign body of other part of head, subsequent encounter: Secondary | ICD-10-CM | POA: Diagnosis not present

## 2023-12-01 DIAGNOSIS — R32 Unspecified urinary incontinence: Secondary | ICD-10-CM | POA: Diagnosis not present

## 2023-12-01 DIAGNOSIS — Z604 Social exclusion and rejection: Secondary | ICD-10-CM | POA: Diagnosis not present

## 2023-12-01 DIAGNOSIS — I10 Essential (primary) hypertension: Secondary | ICD-10-CM | POA: Diagnosis not present

## 2023-12-01 DIAGNOSIS — E119 Type 2 diabetes mellitus without complications: Secondary | ICD-10-CM | POA: Diagnosis not present

## 2023-12-02 ENCOUNTER — Ambulatory Visit: Payer: Medicare HMO

## 2023-12-03 DIAGNOSIS — Z556 Problems related to health literacy: Secondary | ICD-10-CM | POA: Diagnosis not present

## 2023-12-03 DIAGNOSIS — Z604 Social exclusion and rejection: Secondary | ICD-10-CM | POA: Diagnosis not present

## 2023-12-03 DIAGNOSIS — S0181XD Laceration without foreign body of other part of head, subsequent encounter: Secondary | ICD-10-CM | POA: Diagnosis not present

## 2023-12-03 DIAGNOSIS — R4182 Altered mental status, unspecified: Secondary | ICD-10-CM | POA: Diagnosis not present

## 2023-12-03 DIAGNOSIS — Z9181 History of falling: Secondary | ICD-10-CM | POA: Diagnosis not present

## 2023-12-03 DIAGNOSIS — R32 Unspecified urinary incontinence: Secondary | ICD-10-CM | POA: Diagnosis not present

## 2023-12-03 DIAGNOSIS — I10 Essential (primary) hypertension: Secondary | ICD-10-CM | POA: Diagnosis not present

## 2023-12-03 DIAGNOSIS — S61412D Laceration without foreign body of left hand, subsequent encounter: Secondary | ICD-10-CM | POA: Diagnosis not present

## 2023-12-03 DIAGNOSIS — G8929 Other chronic pain: Secondary | ICD-10-CM | POA: Diagnosis not present

## 2023-12-03 DIAGNOSIS — I999 Unspecified disorder of circulatory system: Secondary | ICD-10-CM | POA: Diagnosis not present

## 2023-12-03 DIAGNOSIS — E119 Type 2 diabetes mellitus without complications: Secondary | ICD-10-CM | POA: Diagnosis not present

## 2023-12-04 DIAGNOSIS — R4182 Altered mental status, unspecified: Secondary | ICD-10-CM | POA: Diagnosis not present

## 2023-12-04 DIAGNOSIS — N390 Urinary tract infection, site not specified: Secondary | ICD-10-CM | POA: Diagnosis not present

## 2023-12-07 DIAGNOSIS — S61412D Laceration without foreign body of left hand, subsequent encounter: Secondary | ICD-10-CM | POA: Diagnosis not present

## 2023-12-07 DIAGNOSIS — R32 Unspecified urinary incontinence: Secondary | ICD-10-CM | POA: Diagnosis not present

## 2023-12-07 DIAGNOSIS — I999 Unspecified disorder of circulatory system: Secondary | ICD-10-CM | POA: Diagnosis not present

## 2023-12-07 DIAGNOSIS — Z556 Problems related to health literacy: Secondary | ICD-10-CM | POA: Diagnosis not present

## 2023-12-07 DIAGNOSIS — S0181XD Laceration without foreign body of other part of head, subsequent encounter: Secondary | ICD-10-CM | POA: Diagnosis not present

## 2023-12-07 DIAGNOSIS — Z604 Social exclusion and rejection: Secondary | ICD-10-CM | POA: Diagnosis not present

## 2023-12-07 DIAGNOSIS — I10 Essential (primary) hypertension: Secondary | ICD-10-CM | POA: Diagnosis not present

## 2023-12-07 DIAGNOSIS — E119 Type 2 diabetes mellitus without complications: Secondary | ICD-10-CM | POA: Diagnosis not present

## 2023-12-07 DIAGNOSIS — Z9181 History of falling: Secondary | ICD-10-CM | POA: Diagnosis not present

## 2023-12-08 DIAGNOSIS — S61412D Laceration without foreign body of left hand, subsequent encounter: Secondary | ICD-10-CM | POA: Diagnosis not present

## 2023-12-08 DIAGNOSIS — I10 Essential (primary) hypertension: Secondary | ICD-10-CM | POA: Diagnosis not present

## 2023-12-08 DIAGNOSIS — R32 Unspecified urinary incontinence: Secondary | ICD-10-CM | POA: Diagnosis not present

## 2023-12-08 DIAGNOSIS — S0181XD Laceration without foreign body of other part of head, subsequent encounter: Secondary | ICD-10-CM | POA: Diagnosis not present

## 2023-12-08 DIAGNOSIS — I999 Unspecified disorder of circulatory system: Secondary | ICD-10-CM | POA: Diagnosis not present

## 2023-12-08 DIAGNOSIS — Z556 Problems related to health literacy: Secondary | ICD-10-CM | POA: Diagnosis not present

## 2023-12-08 DIAGNOSIS — E119 Type 2 diabetes mellitus without complications: Secondary | ICD-10-CM | POA: Diagnosis not present

## 2023-12-08 DIAGNOSIS — Z9181 History of falling: Secondary | ICD-10-CM | POA: Diagnosis not present

## 2023-12-08 DIAGNOSIS — Z604 Social exclusion and rejection: Secondary | ICD-10-CM | POA: Diagnosis not present

## 2023-12-16 DIAGNOSIS — I10 Essential (primary) hypertension: Secondary | ICD-10-CM | POA: Diagnosis not present

## 2023-12-16 DIAGNOSIS — S61412D Laceration without foreign body of left hand, subsequent encounter: Secondary | ICD-10-CM | POA: Diagnosis not present

## 2023-12-16 DIAGNOSIS — Z9181 History of falling: Secondary | ICD-10-CM | POA: Diagnosis not present

## 2023-12-16 DIAGNOSIS — S0181XD Laceration without foreign body of other part of head, subsequent encounter: Secondary | ICD-10-CM | POA: Diagnosis not present

## 2023-12-16 DIAGNOSIS — Z556 Problems related to health literacy: Secondary | ICD-10-CM | POA: Diagnosis not present

## 2023-12-16 DIAGNOSIS — E119 Type 2 diabetes mellitus without complications: Secondary | ICD-10-CM | POA: Diagnosis not present

## 2023-12-16 DIAGNOSIS — R32 Unspecified urinary incontinence: Secondary | ICD-10-CM | POA: Diagnosis not present

## 2023-12-16 DIAGNOSIS — Z604 Social exclusion and rejection: Secondary | ICD-10-CM | POA: Diagnosis not present

## 2023-12-16 DIAGNOSIS — I999 Unspecified disorder of circulatory system: Secondary | ICD-10-CM | POA: Diagnosis not present

## 2023-12-18 DIAGNOSIS — S61412D Laceration without foreign body of left hand, subsequent encounter: Secondary | ICD-10-CM | POA: Diagnosis not present

## 2023-12-18 DIAGNOSIS — Z9181 History of falling: Secondary | ICD-10-CM | POA: Diagnosis not present

## 2023-12-18 DIAGNOSIS — S0181XD Laceration without foreign body of other part of head, subsequent encounter: Secondary | ICD-10-CM | POA: Diagnosis not present

## 2023-12-18 DIAGNOSIS — Z604 Social exclusion and rejection: Secondary | ICD-10-CM | POA: Diagnosis not present

## 2023-12-18 DIAGNOSIS — R32 Unspecified urinary incontinence: Secondary | ICD-10-CM | POA: Diagnosis not present

## 2023-12-18 DIAGNOSIS — E119 Type 2 diabetes mellitus without complications: Secondary | ICD-10-CM | POA: Diagnosis not present

## 2023-12-18 DIAGNOSIS — I999 Unspecified disorder of circulatory system: Secondary | ICD-10-CM | POA: Diagnosis not present

## 2023-12-18 DIAGNOSIS — Z556 Problems related to health literacy: Secondary | ICD-10-CM | POA: Diagnosis not present

## 2023-12-18 DIAGNOSIS — I10 Essential (primary) hypertension: Secondary | ICD-10-CM | POA: Diagnosis not present

## 2023-12-21 DIAGNOSIS — I10 Essential (primary) hypertension: Secondary | ICD-10-CM | POA: Diagnosis not present

## 2023-12-21 DIAGNOSIS — I999 Unspecified disorder of circulatory system: Secondary | ICD-10-CM | POA: Diagnosis not present

## 2023-12-21 DIAGNOSIS — E119 Type 2 diabetes mellitus without complications: Secondary | ICD-10-CM | POA: Diagnosis not present

## 2023-12-21 DIAGNOSIS — Z604 Social exclusion and rejection: Secondary | ICD-10-CM | POA: Diagnosis not present

## 2023-12-21 DIAGNOSIS — Z556 Problems related to health literacy: Secondary | ICD-10-CM | POA: Diagnosis not present

## 2023-12-21 DIAGNOSIS — Z9181 History of falling: Secondary | ICD-10-CM | POA: Diagnosis not present

## 2023-12-21 DIAGNOSIS — S0181XD Laceration without foreign body of other part of head, subsequent encounter: Secondary | ICD-10-CM | POA: Diagnosis not present

## 2023-12-21 DIAGNOSIS — S61412D Laceration without foreign body of left hand, subsequent encounter: Secondary | ICD-10-CM | POA: Diagnosis not present

## 2023-12-21 DIAGNOSIS — R32 Unspecified urinary incontinence: Secondary | ICD-10-CM | POA: Diagnosis not present

## 2023-12-22 DIAGNOSIS — R32 Unspecified urinary incontinence: Secondary | ICD-10-CM | POA: Diagnosis not present

## 2023-12-22 DIAGNOSIS — E119 Type 2 diabetes mellitus without complications: Secondary | ICD-10-CM | POA: Diagnosis not present

## 2023-12-22 DIAGNOSIS — Z9181 History of falling: Secondary | ICD-10-CM | POA: Diagnosis not present

## 2023-12-22 DIAGNOSIS — Z604 Social exclusion and rejection: Secondary | ICD-10-CM | POA: Diagnosis not present

## 2023-12-22 DIAGNOSIS — S0181XD Laceration without foreign body of other part of head, subsequent encounter: Secondary | ICD-10-CM | POA: Diagnosis not present

## 2023-12-22 DIAGNOSIS — I10 Essential (primary) hypertension: Secondary | ICD-10-CM | POA: Diagnosis not present

## 2023-12-22 DIAGNOSIS — I999 Unspecified disorder of circulatory system: Secondary | ICD-10-CM | POA: Diagnosis not present

## 2023-12-22 DIAGNOSIS — S61412D Laceration without foreign body of left hand, subsequent encounter: Secondary | ICD-10-CM | POA: Diagnosis not present

## 2023-12-22 DIAGNOSIS — Z556 Problems related to health literacy: Secondary | ICD-10-CM | POA: Diagnosis not present

## 2023-12-24 DIAGNOSIS — K5909 Other constipation: Secondary | ICD-10-CM | POA: Diagnosis not present

## 2023-12-24 DIAGNOSIS — E1142 Type 2 diabetes mellitus with diabetic polyneuropathy: Secondary | ICD-10-CM | POA: Diagnosis not present

## 2023-12-24 DIAGNOSIS — M8588 Other specified disorders of bone density and structure, other site: Secondary | ICD-10-CM | POA: Diagnosis not present

## 2023-12-24 DIAGNOSIS — Z0000 Encounter for general adult medical examination without abnormal findings: Secondary | ICD-10-CM | POA: Diagnosis not present

## 2023-12-24 DIAGNOSIS — E559 Vitamin D deficiency, unspecified: Secondary | ICD-10-CM | POA: Diagnosis not present

## 2023-12-24 DIAGNOSIS — E7800 Pure hypercholesterolemia, unspecified: Secondary | ICD-10-CM | POA: Diagnosis not present

## 2023-12-24 DIAGNOSIS — F5101 Primary insomnia: Secondary | ICD-10-CM | POA: Diagnosis not present

## 2023-12-24 DIAGNOSIS — E039 Hypothyroidism, unspecified: Secondary | ICD-10-CM | POA: Diagnosis not present

## 2023-12-24 DIAGNOSIS — I10 Essential (primary) hypertension: Secondary | ICD-10-CM | POA: Diagnosis not present

## 2023-12-24 DIAGNOSIS — H409 Unspecified glaucoma: Secondary | ICD-10-CM | POA: Diagnosis not present

## 2023-12-25 DIAGNOSIS — Z556 Problems related to health literacy: Secondary | ICD-10-CM | POA: Diagnosis not present

## 2023-12-25 DIAGNOSIS — E119 Type 2 diabetes mellitus without complications: Secondary | ICD-10-CM | POA: Diagnosis not present

## 2023-12-25 DIAGNOSIS — I10 Essential (primary) hypertension: Secondary | ICD-10-CM | POA: Diagnosis not present

## 2023-12-25 DIAGNOSIS — S61412D Laceration without foreign body of left hand, subsequent encounter: Secondary | ICD-10-CM | POA: Diagnosis not present

## 2023-12-25 DIAGNOSIS — Z604 Social exclusion and rejection: Secondary | ICD-10-CM | POA: Diagnosis not present

## 2023-12-25 DIAGNOSIS — Z9181 History of falling: Secondary | ICD-10-CM | POA: Diagnosis not present

## 2023-12-25 DIAGNOSIS — R32 Unspecified urinary incontinence: Secondary | ICD-10-CM | POA: Diagnosis not present

## 2023-12-25 DIAGNOSIS — I999 Unspecified disorder of circulatory system: Secondary | ICD-10-CM | POA: Diagnosis not present

## 2023-12-25 DIAGNOSIS — S0181XD Laceration without foreign body of other part of head, subsequent encounter: Secondary | ICD-10-CM | POA: Diagnosis not present

## 2023-12-29 DIAGNOSIS — I10 Essential (primary) hypertension: Secondary | ICD-10-CM | POA: Diagnosis not present

## 2023-12-29 DIAGNOSIS — R32 Unspecified urinary incontinence: Secondary | ICD-10-CM | POA: Diagnosis not present

## 2023-12-29 DIAGNOSIS — Z556 Problems related to health literacy: Secondary | ICD-10-CM | POA: Diagnosis not present

## 2023-12-29 DIAGNOSIS — I999 Unspecified disorder of circulatory system: Secondary | ICD-10-CM | POA: Diagnosis not present

## 2023-12-29 DIAGNOSIS — S61412D Laceration without foreign body of left hand, subsequent encounter: Secondary | ICD-10-CM | POA: Diagnosis not present

## 2023-12-29 DIAGNOSIS — Z604 Social exclusion and rejection: Secondary | ICD-10-CM | POA: Diagnosis not present

## 2023-12-29 DIAGNOSIS — S0181XD Laceration without foreign body of other part of head, subsequent encounter: Secondary | ICD-10-CM | POA: Diagnosis not present

## 2023-12-29 DIAGNOSIS — E119 Type 2 diabetes mellitus without complications: Secondary | ICD-10-CM | POA: Diagnosis not present

## 2023-12-29 DIAGNOSIS — Z9181 History of falling: Secondary | ICD-10-CM | POA: Diagnosis not present

## 2024-01-14 DIAGNOSIS — I10 Essential (primary) hypertension: Secondary | ICD-10-CM | POA: Diagnosis not present

## 2024-01-14 DIAGNOSIS — M1612 Unilateral primary osteoarthritis, left hip: Secondary | ICD-10-CM | POA: Diagnosis not present

## 2024-01-14 DIAGNOSIS — G8929 Other chronic pain: Secondary | ICD-10-CM | POA: Diagnosis not present

## 2024-01-14 DIAGNOSIS — F5101 Primary insomnia: Secondary | ICD-10-CM | POA: Diagnosis not present
